# Patient Record
Sex: Male | Born: 1995 | Race: White | Hispanic: No | Marital: Single | Smoking: Light tobacco smoker
Health system: Southern US, Community
[De-identification: ages and names within clinical notes are randomized; demographics above are authoritative.]

## PROBLEM LIST (undated history)

## (undated) ENCOUNTER — Ambulatory Visit (HOSPITAL_COMMUNITY): Admission: EM | Payer: 59 | Source: Home / Self Care

## (undated) DIAGNOSIS — G40909 Epilepsy, unspecified, not intractable, without status epilepticus: Secondary | ICD-10-CM

## (undated) DIAGNOSIS — F909 Attention-deficit hyperactivity disorder, unspecified type: Secondary | ICD-10-CM

## (undated) DIAGNOSIS — J45909 Unspecified asthma, uncomplicated: Secondary | ICD-10-CM

## (undated) DIAGNOSIS — R569 Unspecified convulsions: Secondary | ICD-10-CM

## (undated) DIAGNOSIS — F419 Anxiety disorder, unspecified: Secondary | ICD-10-CM

## (undated) HISTORY — PX: ADENOIDECTOMY: SUR15

## (undated) HISTORY — DX: Attention-deficit hyperactivity disorder, unspecified type: F90.9

## (undated) HISTORY — DX: Unspecified asthma, uncomplicated: J45.909

## (undated) HISTORY — DX: Anxiety disorder, unspecified: F41.9

---
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
## 2016-06-16 ENCOUNTER — Ambulatory Visit (INDEPENDENT_AMBULATORY_CARE_PROVIDER_SITE_OTHER): Payer: Commercial Managed Care - PPO | Admitting: Medical

## 2016-06-16 ENCOUNTER — Encounter: Payer: Self-pay | Admitting: Medical

## 2016-06-16 VITALS — BP 122/84 | HR 66

## 2016-06-16 DIAGNOSIS — F411 Generalized anxiety disorder: Secondary | ICD-10-CM | POA: Insufficient documentation

## 2016-06-16 DIAGNOSIS — F4010 Social phobia, unspecified: Secondary | ICD-10-CM | POA: Insufficient documentation
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
## 2016-06-16 NOTE — Patient Instructions (Addendum)
Counseling services  [facility] [address], [place], [place] [address] [phone]  [facility] [address], [place], [place] [address] Phone: [phone]   [facility] [phone] office [email or web address] [address] [place], [place], [place] [address]  Gale JourneyLaura Atkinson, therapist  Franchot ErichsenErik Nelson, MA, clinical psychologist  Cognitive-Behavior Therapy; Mood Disorders; Anxiety Disorders; adult and child ADHD; Family Therapy; Stress Management; personal growth, and Marital Therapy.    Carlus Pavlovennis McKnight Ph.D., clinical psychologist Cognitive-Behavior Therapy; Mood Disorders; Anxiety Disorders; Stress     Management   [facility] [address], [place], [place] [address] [phone]

## 2016-07-03 ENCOUNTER — Ambulatory Visit (INDEPENDENT_AMBULATORY_CARE_PROVIDER_SITE_OTHER): Payer: Commercial Managed Care - PPO | Admitting: Psychology

## 2016-07-03 DIAGNOSIS — F4010 Social phobia, unspecified: Secondary | ICD-10-CM

## 2016-07-03 DIAGNOSIS — F1220 Cannabis dependence, uncomplicated: Secondary | ICD-10-CM | POA: Diagnosis not present

## 2016-07-21 ENCOUNTER — Ambulatory Visit (INDEPENDENT_AMBULATORY_CARE_PROVIDER_SITE_OTHER): Payer: Commercial Managed Care - PPO | Admitting: Psychology

## 2016-07-21 DIAGNOSIS — F4010 Social phobia, unspecified: Secondary | ICD-10-CM

## 2016-08-16 ENCOUNTER — Ambulatory Visit: Payer: Self-pay | Admitting: Psychology

## 2016-12-11 ENCOUNTER — Ambulatory Visit (INDEPENDENT_AMBULATORY_CARE_PROVIDER_SITE_OTHER): Payer: Commercial Managed Care - PPO | Admitting: Psychology

## 2016-12-11 DIAGNOSIS — F4010 Social phobia, unspecified: Secondary | ICD-10-CM

## 2017-03-24 ENCOUNTER — Emergency Department (HOSPITAL_COMMUNITY)
Admission: EM | Admit: 2017-03-24 | Discharge: 2017-03-25 | Disposition: A | Discharge status: Other Acute Inpatient Hospital | Payer: Commercial Managed Care - PPO | Attending: Emergency Medicine | Admitting: Emergency Medicine

## 2017-03-24 ENCOUNTER — Encounter (HOSPITAL_COMMUNITY): Payer: Self-pay

## 2017-03-24 ENCOUNTER — Ambulatory Visit (HOSPITAL_COMMUNITY)
Admission: RE | Admit: 2017-03-24 | Discharge: 2017-03-24 | Disposition: A | Discharge status: Home / Self Care | Payer: Commercial Managed Care - PPO | Attending: Psychiatry | Admitting: Psychiatry

## 2017-03-24 DIAGNOSIS — T1491XA Suicide attempt, initial encounter: Secondary | ICD-10-CM | POA: Insufficient documentation

## 2017-03-24 DIAGNOSIS — Y929 Unspecified place or not applicable: Secondary | ICD-10-CM | POA: Diagnosis not present

## 2017-03-24 DIAGNOSIS — X58XXXA Exposure to other specified factors, initial encounter: Secondary | ICD-10-CM | POA: Insufficient documentation

## 2017-03-24 DIAGNOSIS — Z5181 Encounter for therapeutic drug level monitoring: Secondary | ICD-10-CM | POA: Diagnosis not present

## 2017-03-24 DIAGNOSIS — R45851 Suicidal ideations: Secondary | ICD-10-CM

## 2017-03-24 DIAGNOSIS — F1320 Sedative, hypnotic or anxiolytic dependence, uncomplicated: Secondary | ICD-10-CM | POA: Insufficient documentation

## 2017-03-24 DIAGNOSIS — Y939 Activity, unspecified: Secondary | ICD-10-CM | POA: Diagnosis not present

## 2017-03-24 DIAGNOSIS — Y999 Unspecified external cause status: Secondary | ICD-10-CM | POA: Diagnosis not present

## 2017-03-24 DIAGNOSIS — F17200 Nicotine dependence, unspecified, uncomplicated: Secondary | ICD-10-CM | POA: Insufficient documentation

## 2017-03-24 LAB — RAPID URINE DRUG SCREEN, HOSP PERFORMED
AMPHETAMINES: NOT DETECTED
BENZODIAZEPINES: POSITIVE — AB
Barbiturates: NOT DETECTED
Cocaine: NOT DETECTED
Opiates: NOT DETECTED
TETRAHYDROCANNABINOL: NOT DETECTED

## 2017-03-24 LAB — COMPREHENSIVE METABOLIC PANEL
ALBUMIN: 5 g/dL (ref 3.5–5.0)
ALT: 16 U/L — AB (ref 17–63)
AST: 28 U/L (ref 15–41)
Alkaline Phosphatase: 137 U/L — ABNORMAL HIGH (ref 38–126)
Anion gap: 13 (ref 5–15)
BUN: 8 mg/dL (ref 6–20)
CO2: 24 mmol/L (ref 22–32)
CREATININE: 1.06 mg/dL (ref 0.61–1.24)
Calcium: 9.8 mg/dL (ref 8.9–10.3)
Chloride: 107 mmol/L (ref 101–111)
GFR calc Af Amer: >60 mL/min (ref >60)
GFR calc non Af Amer: >60 mL/min (ref >60)
Glucose, Bld: 88 mg/dL (ref 65–99)
POTASSIUM: 3.5 mmol/L (ref 3.5–5.1)
Sodium: 144 mmol/L (ref 135–145)
Total Bilirubin: 0.4 mg/dL (ref 0.3–1.2)
Total Protein: 8.3 g/dL — ABNORMAL HIGH (ref 6.5–8.1)

## 2017-03-24 LAB — CBC
HEMATOCRIT: 50.4 % (ref 39.0–52.0)
HEMOGLOBIN: 18.3 g/dL — AB (ref 13.0–17.0)
MCH: 32.2 pg (ref 26.0–34.0)
MCHC: 36.3 g/dL — ABNORMAL HIGH (ref 30.0–36.0)
MCV: 88.6 fL (ref 78.0–100.0)
PLATELETS: 270 10*3/uL (ref 150–400)
RBC: 5.69 MIL/uL (ref 4.22–5.81)
RDW: 13.3 % (ref 11.5–15.5)
WBC: 8.8 10*3/uL (ref 4.0–10.5)

## 2017-03-24 LAB — SALICYLATE LEVEL: Salicylate Lvl: <7 mg/dL (ref 2.8–30.0)

## 2017-03-24 LAB — ETHANOL
ALCOHOL ETHYL (B): 267 mg/dL — AB (ref <5)
Alcohol, Ethyl (B): 160 mg/dL — ABNORMAL HIGH (ref <5)

## 2017-03-24 LAB — ACETAMINOPHEN LEVEL: Acetaminophen (Tylenol), Serum: <10 ug/mL — ABNORMAL LOW (ref 10–30)

## 2017-03-24 MED ORDER — THIAMINE HCL 100 MG/ML IJ SOLN: 100.0000 mg | Freq: Every day | INTRAMUSCULAR | Status: DC

## 2017-03-24 MED ORDER — LORAZEPAM 2 MG/ML IJ SOLN: 0.0000 mg | Freq: Four times a day (QID) | INTRAMUSCULAR | Status: DC

## 2017-03-24 MED ORDER — LORAZEPAM 2 MG/ML IJ SOLN: 0.0000 mg | Freq: Two times a day (BID) | INTRAMUSCULAR | Status: DC

## 2017-03-24 MED ORDER — LORAZEPAM 1 MG PO TABS
1.0000 mg | ORAL_TABLET | Freq: Once | ORAL | Status: AC
  Administered 2017-03-24: 1 mg via ORAL
  Filled 2017-03-24: qty 1

## 2017-03-24 MED ORDER — HALOPERIDOL LACTATE 5 MG/ML IJ SOLN
5.0000 mg | Freq: Once | INTRAMUSCULAR | Status: AC
  Administered 2017-03-24: 5 mg via INTRAMUSCULAR
  Filled 2017-03-24: qty 1

## 2017-03-24 MED ORDER — LORAZEPAM 1 MG PO TABS
0.0000 mg | ORAL_TABLET | Freq: Four times a day (QID) | ORAL | Status: DC
  Administered 2017-03-24: 1 mg via ORAL
  Filled 2017-03-24: qty 1

## 2017-03-24 MED ORDER — LORAZEPAM 1 MG PO TABS: 0.0000 mg | ORAL_TABLET | Freq: Two times a day (BID) | ORAL | Status: DC

## 2017-03-24 MED ORDER — VITAMIN B-1 100 MG PO TABS: 100.0000 mg | ORAL_TABLET | Freq: Every day | ORAL | Status: DC

## 2017-03-24 NOTE — ED Notes (Signed)
Patient not seen in lobby, per registration, patient not in lobby. Registration and this Clinical research associatewriter saw patient and family member walking aware from the emergency department to the parking lot.

## 2017-03-24 NOTE — ED Notes (Signed)
Report to Gary RN in SAPPU-ready for patient 

## 2017-03-24 NOTE — BH Assessment (Signed)
[facility] with mother. Pt reported on walk in form that he had overdosed two days ago on #9 pills, possibly lexapro or buspar in suicide attempted. Reviewed this with NP Jacki ConesLaurie, out to lobby to speak with patient and his mother , who accompanied him. Mother reported patient told her this and patient agreed, however patient with racing thoughts, pressured speech, and poor historian stating '' I just did that but it's fine I don't need to stay I'm not gonna do it anymore'' Pt mother had to help patient fill out form as patient was pacing and drinking water cups rapidly. Due to concern of poor history, and patient reported overdose [facility] and [facility]. Pt and mother agreed. Pt mother requested to take patient to lobby instead of [facility] or EMS which was offered by this Clinical research associatewriter and Jacki ConesLaurie NP. Above Report given to [facility].
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
## 2017-03-24 NOTE — ED Notes (Signed)
Pt. Transferred to [facility] from ED to 37 room after screening for contraband. Report to include Situation, Background, Assessment and Recommendations from RN. Pt. Oriented to unit including Q15 minute rounds as well as the security cameras for their protection. Patient is alert and oriented, warm and dry in no acute distress. Patient denies SI, HI, and AVH. Pt. Encouraged to let me know if needs arise.

## 2017-03-24 NOTE — ED Notes (Signed)
Patient is uncooperative/cursing and threatening staff-patient punching walls in the room-Dr. Freida BusmanAllen notified and orders obtained for physical and chemical restraints.

## 2017-03-24 NOTE — ED Notes (Signed)
Patient is resting quietly now-restraints removed

## 2017-03-24 NOTE — ED Triage Notes (Signed)
[facility].  Pt has had suicidal thoughts and states took antidepressants lexapro and buspar.  Pt family finally able to get him to come in here today.  Pt states he is aggressive and will "beat anyone that messes with him".  Pt then states he doesn't want to hurt anyone.  Pt is emotional at times during assessment.  Pt wants 1/2 xanax which he normally takes but has not taken in past 2 days.  States he will find is somewhere else if not given to him so "it's ok".  Mom/boyfriend and step dad is with patient.  Pt denies hallucinations.

## 2017-03-24 NOTE — ED Notes (Signed)
Bed: [facility] Expected date:  Expected time:  Means of arrival:  Comments: 
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
## 2017-03-24 NOTE — Progress Notes (Signed)
Pt meets criteria for inpt treatment. TTS faxed referrals to the following inpt facilities for review:  [facility], [facility], [facility], [facility], [address], [facility], [facility], [facility]   TTS will continue to seek bed placement.  [name], LCSWA
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
## 2017-03-25 NOTE — ED Notes (Signed)
Hourly rounding reveals patient sleeping in room. No complaints, stable, in no acute distress. Q15 minute rounds and monitoring via Security Cameras to continue. 

## 2017-03-25 NOTE — ED Notes (Signed)
Pt sleeping at present, no distress noted, calm & cooperative at present.  Monitoring for safety, Q 15 min checks in effect.  Pending report to  [facility] at 8:30 am.

## 2017-03-25 NOTE — ED Provider Notes (Signed)
  Physical Exam  BP 133/63 (BP Location: Right Arm)   Pulse (!) 52   Temp 98.8 F (37.1 C) (Oral)   Resp 16   SpO2 98%   Physical Exam  ED Course  Procedures MDM [facility] under Dr. Mordecai MaesSanchez. Stable for transfer       Charlynne PanderYao, Traevon Meiring Hsienta, MD 03/25/17 [phone]

## 2017-03-25 NOTE — ED Notes (Signed)
Pt. Made aware of [facility]. Pt. Acknowledged same.

## 2017-03-25 NOTE — ED Notes (Signed)
Report called to [facility], RN Ferdinand CavaJenaro Figueroa accepted report.  Pharmacologistending [facility] [employer].

## 2017-03-25 NOTE — BH Assessment (Signed)
Received call from [facility] stating Pt has been accepted by Dr. Cyril MourningIlla Sanchez. Pt can arrive after 0800 today. Telephone number for nursing report is [phone]. Notified Marita SnellenGary Leduc, RN and Dr. Rhunette CroftNanavati of acceptance.   Harlin RainFord Ellis Patsy BaltimoreWarrick Jr, LPC, [facility], [facility] Triage Specialist [phone]

## 2017-03-25 NOTE — ED Notes (Signed)
[facility] at bedside to transport pt to [facility].

## 2017-04-12 ENCOUNTER — Ambulatory Visit (INDEPENDENT_AMBULATORY_CARE_PROVIDER_SITE_OTHER): Payer: Commercial Managed Care - PPO | Admitting: Family Medicine

## 2017-04-12 ENCOUNTER — Encounter: Payer: Self-pay | Admitting: Family Medicine

## 2017-04-12 VITALS — BP 133/81 | HR 64 | Temp 98.0°F | Resp 16 | Ht 70.0 in | Wt 148.6 lb

## 2017-04-12 DIAGNOSIS — F17200 Nicotine dependence, unspecified, uncomplicated: Secondary | ICD-10-CM | POA: Diagnosis not present

## 2017-04-12 DIAGNOSIS — F1310 Sedative, hypnotic or anxiolytic abuse, uncomplicated: Secondary | ICD-10-CM | POA: Diagnosis not present

## 2017-04-12 DIAGNOSIS — G4700 Insomnia, unspecified: Secondary | ICD-10-CM | POA: Diagnosis not present

## 2017-04-12 DIAGNOSIS — F1010 Alcohol abuse, uncomplicated: Secondary | ICD-10-CM | POA: Diagnosis not present

## 2017-04-12 DIAGNOSIS — F411 Generalized anxiety disorder: Secondary | ICD-10-CM

## 2017-04-12 DIAGNOSIS — F4010 Social phobia, unspecified: Secondary | ICD-10-CM | POA: Diagnosis not present

## 2017-04-12 DIAGNOSIS — Z5181 Encounter for therapeutic drug level monitoring: Secondary | ICD-10-CM

## 2017-04-12 DIAGNOSIS — F321 Major depressive disorder, single episode, moderate: Secondary | ICD-10-CM

## 2017-04-12 MED ORDER — CHLORDIAZEPOXIDE HCL 5 MG PO CAPS: 5.0000 mg | ORAL_CAPSULE | ORAL | 0 refills | Status: DC

## 2017-04-12 MED ORDER — DOXEPIN HCL 10 MG PO CAPS: 10.0000 mg | ORAL_CAPSULE | Freq: Every evening | ORAL | 1 refills | Status: DC | PRN

## 2017-04-12 MED ORDER — VENLAFAXINE HCL ER 37.5 MG PO CP24: 37.5000 mg | ORAL_CAPSULE | Freq: Every day | ORAL | 0 refills | Status: DC
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
## 2017-04-12 NOTE — Patient Instructions (Addendum)
     IF you received an x-ray today, you will receive an invoice from [facility]. Please contact [facility] at [phone] with questions or concerns regarding your invoice.   IF you received labwork today, you will receive an invoice from [facility]. Please contact [facility] at [phone] with questions or concerns regarding your invoice.   Our billing staff will not be able to assist you with questions regarding bills from these companies.  You will be contacted with the lab results as soon as they are available. The fastest way to get your results is to activate your My Chart account. Instructions are located on the last page of this paperwork. If you have not heard from us regarding the results in 2 weeks, please contact this office.     Check out [facility]    No psychiatric services take physician referrals - they always want the patient to call. Some excellent private psychiatrists for individual counseling are:   [facility] [address], [place], [place] [address] Phone: [phone]  [facility]  [address], [place], [place] [address]  [phone]  [facility]  [address], [place], [place] [address]  [phone]  [facility] [address] [place], [place] Phone: [phone]    [facility]  [address], [place], [place] [address]  [phone]

## 2017-04-14 ENCOUNTER — Encounter: Payer: Self-pay | Admitting: Family Medicine

## 2017-04-14 DIAGNOSIS — F1310 Sedative, hypnotic or anxiolytic abuse, uncomplicated: Secondary | ICD-10-CM

## 2017-04-14 DIAGNOSIS — G4700 Insomnia, unspecified: Secondary | ICD-10-CM | POA: Insufficient documentation

## 2017-04-14 DIAGNOSIS — F1010 Alcohol abuse, uncomplicated: Secondary | ICD-10-CM | POA: Insufficient documentation

## 2017-04-14 DIAGNOSIS — F17200 Nicotine dependence, unspecified, uncomplicated: Secondary | ICD-10-CM | POA: Insufficient documentation

## 2017-04-14 HISTORY — DX: Sedative, hypnotic or anxiolytic abuse, uncomplicated: F13.10

## 2017-04-20 LAB — TOXASSURE SELECT 13 (MW), URINE

## 2017-04-25 ENCOUNTER — Telehealth: Payer: Self-pay | Admitting: Family Medicine

## 2017-04-25 NOTE — Telephone Encounter (Signed)
DR SHAW PT CALLING TO INFORM YOU THAT NONE OF THE THREE MEDICINES THAT YOU PERSCRIBED HIM IS WORKING IT ONLY MADE HIM THE SAME WAY AND MORE ANXIOUS  PT WOULD LIKE FOR YOU TO WRITE HIM A RX FOR LORAZEPAM AND WHEN HE TRIED TO MAKE APPOINTMENTS TO SEE A THERAPIST THEY ONLY HAD APPTS IN OCT AND PT WOULD LIKE SOMETHING SOONER

## 2017-04-25 NOTE — Telephone Encounter (Signed)
See note below. Please advise.  

## 2017-04-25 NOTE — Telephone Encounter (Signed)
Dr Shaw

## 2017-04-26 ENCOUNTER — Encounter: Payer: Self-pay | Admitting: Family Medicine

## 2017-04-26 ENCOUNTER — Ambulatory Visit (INDEPENDENT_AMBULATORY_CARE_PROVIDER_SITE_OTHER): Payer: Commercial Managed Care - PPO | Admitting: Family Medicine

## 2017-04-26 VITALS — BP 149/74 | HR 71 | Temp 98.4°F | Resp 18 | Ht 70.0 in | Wt 149.8 lb

## 2017-04-26 DIAGNOSIS — F4010 Social phobia, unspecified: Secondary | ICD-10-CM

## 2017-04-26 DIAGNOSIS — F1310 Sedative, hypnotic or anxiolytic abuse, uncomplicated: Secondary | ICD-10-CM

## 2017-04-26 DIAGNOSIS — G4700 Insomnia, unspecified: Secondary | ICD-10-CM | POA: Diagnosis not present

## 2017-04-26 DIAGNOSIS — S93402A Sprain of unspecified ligament of left ankle, initial encounter: Secondary | ICD-10-CM | POA: Diagnosis not present

## 2017-04-26 DIAGNOSIS — F17200 Nicotine dependence, unspecified, uncomplicated: Secondary | ICD-10-CM | POA: Diagnosis not present

## 2017-04-26 DIAGNOSIS — F411 Generalized anxiety disorder: Secondary | ICD-10-CM | POA: Diagnosis not present

## 2017-04-26 MED ORDER — DOXEPIN HCL 50 MG PO CAPS: 50.0000 mg | ORAL_CAPSULE | Freq: Every evening | ORAL | 0 refills | Status: DC | PRN

## 2017-04-26 MED ORDER — DESVENLAFAXINE SUCCINATE ER 50 MG PO TB24: 50.0000 mg | ORAL_TABLET | Freq: Every day | ORAL | 1 refills | Status: DC

## 2017-04-26 MED ORDER — LORAZEPAM 1 MG PO TABS: 1.0000 mg | ORAL_TABLET | Freq: Two times a day (BID) | ORAL | 0 refills | Status: DC | PRN

## 2017-04-26 NOTE — Addendum Note (Signed)
Addended by: Sherren MochaSHAW, Iban Utz N on: 04/26/2017 08:00 AM   Modules accepted: Orders

## 2017-04-26 NOTE — Patient Instructions (Addendum)
IF you received an x-ray today, you will receive an invoice from [facility]. Please contact [facility] at [phone] with questions or concerns regarding your invoice.   IF you received labwork today, you will receive an invoice from [facility]. Please contact [facility] at [phone] with questions or concerns regarding your invoice.   Our billing staff will not be able to assist you with questions regarding bills from these companies.  You will be contacted with the lab results as soon as they are available. The fastest way to get your results is to activate your My Chart account. Instructions are located on the last page of this paperwork. If you have not heard from us regarding the results in 2 weeks, please contact this office.      Ankle Sprain, Phase II Rehab Ask your health care provider which exercises are safe for you. Do exercises exactly as told by your health care provider and adjust them as directed. It is normal to feel mild stretching, pulling, tightness, or discomfort as you do these exercises, but you should stop right away if you feel sudden pain or your pain gets worse.Do not begin these exercises until told by your health care provider. Stretching and range of motion exercises These exercises warm up your muscles and joints and improve the movement and flexibility of your lower leg and ankle. These exercises also help to relieve pain and stiffness. Exercise A: Gastroc stretch, standing  1. Stand with your hands against a wall. 2. Extend your left / right leg behind you, and bend your front knee slightly. Your heels should be on the floor. 3. Keeping your heels on the floor and your back knee straight, shift your weight toward the wall. You should feel a gentle stretch in the back of your lower leg (calf). 4. Hold this position for __________ seconds. Repeat __________ times. Complete this exercise __________ times a day. Exercise B: Soleus  stretch, standing 1. Stand with your hands against a wall. 2. Extend your left / right leg behind you, and bend your front knee slightly. Both of your heels should be on the floor. 3. Keeping your heels on the floor, bend your back knee and shift your weight slightly over your back leg. You should feel a gentle stretch deep in your calf. 4. Hold this position for __________ seconds. Repeat __________ times. Complete this exercise __________ times a day. Strengthening exercises These exercises build strength and endurance in your lower leg. Endurance is the ability to use your muscles for a long time, even after they get tired. Exercise C: Heel walking ( dorsiflexion) Walk on your heels for __________ seconds or ___________ ft. Keep your toes as high as possible. Repeat __________ times. Complete this exercise __________ times a day. Balance exercises These exercises improve your balance and the reaction and control of your ankle to help improve stability. Exercise D: Multi-angle lunge 1. Stand with your feet together. 2. Take a step forward with your left / right leg, and shift your weight onto that leg. Your back heel will come off the floor, and your back toes will stay in place. 3. Push off your front leg to return your front foot to the starting position next to your other foot. 4. Repeat to the side, to the back, and any other directions as told by your health care provider. Repeat in each direction __________ times. Complete this exercise __________ times a day. Exercise E: Single leg stand 1. Without shoes,  stand near a railing or in a door frame. Hold onto the railing or door frame as needed. 2. Stand on your left / right foot. Keep your big toe down on the floor and try to keep your arch lifted. 3. Hold this position for __________ seconds. Repeat __________ times. Complete this exercise __________ times a day. If this exercise is too easy, you can try it with your eyes closed or  while standing on a pillow. Exercise F: Inversion/eversion  You will need a balance board for this exercise. Ask your health care provider where you can get a balance board or how you can make one. 1. Stand on a non-carpeted surface near a countertop or wall. 2. Step onto the balance board so your feet are hip-width apart. 3. Keep your feet in place and keep your upper body and hips steady. Using only your feet and ankles to move the board, do one or both of the following exercises as told by your health care provider: ? Tip the board side to side as far as you can, alternating between tipping to the left and tipping to the right. If you can, tip the board so it silently taps the floor. Do not let the board forcefully hit the floor. From time to time, pause to hold a steady position. ? Tip the board side to side so the board does not hit the floor at all. From time to time, pause to hold a steady position. Repeat the movement for each exercise __________ times. Complete each exercise __________ times a day. Exercise G: Plantar flexion/dorsiflexion  You will need a balance board for this exercise. Ask your health care provider where you can get a balance board or how you can make one. 1. Stand on a non-carpeted surface near a countertop or wall. 2. Step onto the balance board so your feet are hip-width apart. 3. Keep your feet in place and keep your upper body and hips steady. Using only your feet and ankles to move the board, do one or both of the following exercises as told by your health care provider: ? Tip the board forward and backward so the board silently taps the floor. Do not let the board forcefully hit the floor. From time to time, pause to hold a steady position. ? Tip the board forward and backward so the board does not hit the floor at all. From time to time, pause to hold a steady position. Repeat the movement for each exercise __________ times. Complete each exercise __________  times a day. This information is not intended to replace advice given to you by your health care provider. Make sure you discuss any questions you have with your health care provider. Document Released: 01/08/2006 Document Revised: 05/25/2016 Document Reviewed: 08/02/2015 Elsevier Interactive Patient Education  2018 [employer]

## 2017-04-26 NOTE — Progress Notes (Signed)
Subjective:    Patient ID: Justin May, male    DOB: 1996-06-10, 21 y.o.   MRN: [id number] Chief Complaint  Patient presents with  . Medication Assistance    consult    HPI Has had weird lucid dreams - had them when he was at [facility] which were more violent than these  Had been on 25mg  bid and still nothing.    [facility] and [facility].  Has had to call out of work for the past several days - feels like nothing is going to work for him because this has been going on for so long - can't even go onto the porch.  Did go in last 4-5d ago.  Felt like he got more anxious he didn't work.    Uncle with crippling anxieyt - and zoloft helped him a lot.    He did throw the lexapro away after 10d.  But then after the he realized the librium didn't work is when things spiked with this.   Past Medical History:  Diagnosis Date  . Anxiety   . Asthma    History reviewed. No pertinent surgical history. No current outpatient prescriptions on file prior to visit.   No current facility-administered medications on file prior to visit.    No Known Allergies History reviewed. No pertinent family history. Social History   Social History  . Marital status: Single    Spouse name: N/A  . Number of children: N/A  . Years of education: N/A   Social History Main Topics  . Smoking status: Current Every Day Smoker  . Smokeless tobacco: Never Used  . Alcohol use Yes  . Drug use: No  . Sexual activity: Not Currently   Other Topics Concern  . None   Social History Narrative  . None   Depression screen [facility] 2/9 04/26/2017 04/12/2017  Decreased Interest 0 3  Down, Depressed, Hopeless 0 1  PHQ - 2 Score 0 4  Altered sleeping - 3  Tired, decreased energy - 1  Change in appetite - 3  Feeling bad or failure about yourself  - 1  Trouble concentrating - 0  Moving slowly or fidgety/restless - 3  Suicidal thoughts - 0  PHQ-9 Score - 15  Difficult doing work/chores  - Extremely dIfficult     Review of Systems See hpi    Objective:   Physical Exam  Constitutional: He is oriented to person, place, and time. He appears well-developed and well-nourished. No distress.  HENT:  Head: Normocephalic and atraumatic.  Eyes: No scleral icterus.  Pulmonary/Chest: Effort normal.  Neurological: He is alert and oriented to person, place, and time.  Skin: Skin is warm and dry. He is not diaphoretic.  Psychiatric: His behavior is normal. His mood appears anxious.      BP (!) 149/74   Pulse 71   Temp 98.4 F (36.9 C) (Oral)   Resp 18   Ht 5\' 10"  (1.778 m)   Wt 149 lb 12.8 oz (67.9 kg)   SpO2 99%   BMI 21.49 kg/m     Assessment & Plan:   1. Moderate left ankle sprain, initial encounter   2. Anxiety state   3. Social phobia   4. Insomnia, unspecified type   5. Mild benzodiazepine use disorder   6. Tobacco use disorder      Meds ordered this encounter  Medications  . doxepin (SINEQUAN) 50 MG capsule    Sig: Take 1 capsule (  50 mg total) by mouth at bedtime as needed (insomnia).    Dispense:  30 capsule    Refill:  0  . desvenlafaxine (PRISTIQ) 50 MG 24 hr tablet    Sig: Take 1 tablet (50 mg total) by mouth daily.    Dispense:  30 tablet    Refill:  1  . LORazepam (ATIVAN) 1 MG tablet    Sig: Take 1 tablet (1 mg total) by mouth 2 (two) times daily as needed for anxiety.    Dispense:  60 tablet    Refill:  0     [name], M.D.  Primary Care at [facility] [address] [place], [place] [address] [phone] phone [phone] fax  04/28/17 11:48 PM

## 2017-04-26 NOTE — Telephone Encounter (Signed)
Did he really call all 5 places I gave him?  I had another pt who just got an appointment with a provider at [facility] in August. . .  If he really has tried all 5 places, I would recommend the [facility] [phone]) or [facility] (i will place referral for this but they are often several mos out.   [facility]  [address], [place], [place] [address]  [phone]  [facility] [address], [place], [place] [address]  [phone]  [facility] [address], [address], [place], [id number] Phone: [phone]   If he wants to change a controlled medication, that requires an office visit and he needs to bring him current meds with him to exchange

## 2017-04-30 ENCOUNTER — Emergency Department (HOSPITAL_COMMUNITY)
Admission: EM | Admit: 2017-04-30 | Discharge: 2017-05-01 | Disposition: A | Discharge status: Home / Self Care | Payer: Commercial Managed Care - PPO | Attending: Emergency Medicine | Admitting: Emergency Medicine

## 2017-04-30 ENCOUNTER — Encounter (HOSPITAL_COMMUNITY): Payer: Self-pay | Admitting: *Deleted

## 2017-04-30 DIAGNOSIS — F919 Conduct disorder, unspecified: Secondary | ICD-10-CM | POA: Diagnosis present

## 2017-04-30 DIAGNOSIS — J45909 Unspecified asthma, uncomplicated: Secondary | ICD-10-CM | POA: Diagnosis not present

## 2017-04-30 DIAGNOSIS — R45851 Suicidal ideations: Secondary | ICD-10-CM | POA: Diagnosis not present

## 2017-04-30 DIAGNOSIS — F4325 Adjustment disorder with mixed disturbance of emotions and conduct: Secondary | ICD-10-CM | POA: Diagnosis present

## 2017-04-30 DIAGNOSIS — F17200 Nicotine dependence, unspecified, uncomplicated: Secondary | ICD-10-CM | POA: Diagnosis not present

## 2017-04-30 DIAGNOSIS — G4700 Insomnia, unspecified: Secondary | ICD-10-CM | POA: Diagnosis not present

## 2017-04-30 DIAGNOSIS — Z638 Other specified problems related to primary support group: Secondary | ICD-10-CM

## 2017-04-30 DIAGNOSIS — F1310 Sedative, hypnotic or anxiolytic abuse, uncomplicated: Secondary | ICD-10-CM | POA: Diagnosis not present

## 2017-04-30 DIAGNOSIS — F1010 Alcohol abuse, uncomplicated: Secondary | ICD-10-CM | POA: Diagnosis not present

## 2017-04-30 DIAGNOSIS — Z9114 Patient's other noncompliance with medication regimen: Secondary | ICD-10-CM | POA: Insufficient documentation

## 2017-04-30 LAB — RAPID URINE DRUG SCREEN, HOSP PERFORMED
Amphetamines: NOT DETECTED
BARBITURATES: NOT DETECTED
Benzodiazepines: POSITIVE — AB
COCAINE: NOT DETECTED
OPIATES: NOT DETECTED
TETRAHYDROCANNABINOL: POSITIVE — AB

## 2017-04-30 LAB — COMPREHENSIVE METABOLIC PANEL
ALT: 20 U/L (ref 17–63)
ANION GAP: 8 (ref 5–15)
AST: 32 U/L (ref 15–41)
Albumin: 4.3 g/dL (ref 3.5–5.0)
Alkaline Phosphatase: 75 U/L (ref 38–126)
BILIRUBIN TOTAL: 0.8 mg/dL (ref 0.3–1.2)
BUN: 15 mg/dL (ref 6–20)
CALCIUM: 8.9 mg/dL (ref 8.9–10.3)
CO2: 25 mmol/L (ref 22–32)
CREATININE: 0.86 mg/dL (ref 0.61–1.24)
Chloride: 103 mmol/L (ref 101–111)
Glucose, Bld: 111 mg/dL — ABNORMAL HIGH (ref 65–99)
Potassium: 3.7 mmol/L (ref 3.5–5.1)
Sodium: 136 mmol/L (ref 135–145)
TOTAL PROTEIN: 6.9 g/dL (ref 6.5–8.1)

## 2017-04-30 LAB — CBC
HCT: 42.9 % (ref 39.0–52.0)
HEMOGLOBIN: 15.2 g/dL (ref 13.0–17.0)
MCH: 30.5 pg (ref 26.0–34.0)
MCHC: 35.4 g/dL (ref 30.0–36.0)
MCV: 86 fL (ref 78.0–100.0)
Platelets: 180 10*3/uL (ref 150–400)
RBC: 4.99 MIL/uL (ref 4.22–5.81)
RDW: 13.2 % (ref 11.5–15.5)
WBC: 10 10*3/uL (ref 4.0–10.5)

## 2017-04-30 LAB — ACETAMINOPHEN LEVEL

## 2017-04-30 LAB — SALICYLATE LEVEL

## 2017-04-30 LAB — ETHANOL: Alcohol, Ethyl (B): <5 mg/dL (ref <5)

## 2017-04-30 MED ORDER — DOXEPIN HCL 50 MG PO CAPS
50.0000 mg | ORAL_CAPSULE | Freq: Every evening | ORAL | Status: DC | PRN
  Filled 2017-04-30: qty 1

## 2017-04-30 MED ORDER — LORAZEPAM 1 MG PO TABS: 1.0000 mg | ORAL_TABLET | Freq: Two times a day (BID) | ORAL | Status: DC | PRN

## 2017-04-30 MED ORDER — VENLAFAXINE HCL ER 75 MG PO CP24
75.0000 mg | ORAL_CAPSULE | Freq: Every day | ORAL | Status: DC
  Administered 2017-05-01: 75 mg via ORAL
  Filled 2017-04-30: qty 1

## 2017-04-30 MED ORDER — ACETAMINOPHEN 325 MG PO TABS: 650.0000 mg | ORAL_TABLET | ORAL | Status: DC | PRN
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
## 2017-04-30 NOTE — Telephone Encounter (Signed)
Left message to return call 

## 2017-04-30 NOTE — ED Notes (Signed)
Bed: WBH40 Expected date:  Expected time:  Means of arrival:  Comments: Hold for 27 

## 2017-04-30 NOTE — ED Notes (Signed)
Introduced self to patient.  Pt oriented to unit expectations.  Assessed pt for:  A) Anxiety &/or agitation: On admission to the [facility] pt is calm and cooperative. Denies SI/HI, A/VH. His affect is appropriate. There is a clean and dry, intact dressing on his hand.   S) Safety: Safety maintained with q-15-minute checks and hourly rounds by staff, and security assessed pt for contraband prior to admission.   A) ADLs: Pt able to perform ADLs independently.  P) Pick-Up (room cleanliness): Pt's room clean and free of clutter.
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
## 2017-04-30 NOTE — ED Triage Notes (Signed)
Patient complains of anxiety and depression. Mother IVC'd patient because she does not think he is taking his medications. Patient has increasing combativeness and has threatened suicide today and in the last week.

## 2017-04-30 NOTE — BH Assessment (Signed)
[facility] Assessment Progress Note   Case was staffed with Lord DNP who recommended patient be re-evaluated in the a.m.    
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
## 2017-05-01 DIAGNOSIS — F4325 Adjustment disorder with mixed disturbance of emotions and conduct: Secondary | ICD-10-CM | POA: Diagnosis not present

## 2017-05-01 DIAGNOSIS — G4700 Insomnia, unspecified: Secondary | ICD-10-CM | POA: Diagnosis not present

## 2017-05-01 DIAGNOSIS — F1310 Sedative, hypnotic or anxiolytic abuse, uncomplicated: Secondary | ICD-10-CM

## 2017-05-01 DIAGNOSIS — F17210 Nicotine dependence, cigarettes, uncomplicated: Secondary | ICD-10-CM

## 2017-05-01 DIAGNOSIS — F1010 Alcohol abuse, uncomplicated: Secondary | ICD-10-CM | POA: Diagnosis not present

## 2017-05-01 DIAGNOSIS — Z9114 Patient's other noncompliance with medication regimen: Secondary | ICD-10-CM | POA: Diagnosis not present

## 2017-05-01 DIAGNOSIS — J45909 Unspecified asthma, uncomplicated: Secondary | ICD-10-CM | POA: Diagnosis not present
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
## 2017-05-01 NOTE — ED Notes (Signed)
Assessed pt for:  A) Anxiety &/or agitation: Pt is calm and cooperative. He woke up and ate breakfast. No complaints voiced. Pt would like to go home. Denies SI/HI/AVH.  S) Safety: Safety maintained with q-15-minute checks and hourly rounds by staff.  A) ADLs: Pt able to perform ADLs independently.  P) Pick-Up (room cleanliness): Pt's room clean and free of clutter.

## 2017-05-01 NOTE — BH Assessment (Addendum)
[facility] Assessment Progress Note  Per Thedore MinsMojeed Akintayo, MD, this pt does not require psychiatric hospitalization at this time.  Pt presents under IVC initiated by his mother, which Dr Jannifer FranklinAkintayo has rescinded.  [facility] to continue treatment at [facility].  This has been included in pt's discharge instructions.  Pt's nurse, Diane, has been notified.  Justin Canninghomas Tyriq Moragne, MA Triage Specialist [phone]

## 2017-05-01 NOTE — Discharge Instructions (Signed)
For your behavioral health needs, you are advised to continue treatment at [facility]: ° °     [facility] °     [address], [address] °     [place], [place] [address] °     [phone] °
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
## 2017-05-01 NOTE — ED Notes (Signed)
Pt discharged home, all belongings returned to pt who signed for same, denies SI/HI/AVH.  Discharge instructions read to pt who verbalized understanding. Pt not responding to internal stimuli, pt escorted to the emergency department exit. 

## 2017-05-01 NOTE — Telephone Encounter (Signed)
Second call placed to patient regarding referrals and medication, no answer on patient phone. Left message for patient to return call to office./ S.Kaelan Amble,CMA

## 2017-05-03 ENCOUNTER — Ambulatory Visit: Payer: Commercial Managed Care - PPO | Admitting: Family Medicine

## 2017-05-17 ENCOUNTER — Encounter: Payer: Self-pay | Admitting: Family Medicine

## 2017-05-17 ENCOUNTER — Ambulatory Visit (INDEPENDENT_AMBULATORY_CARE_PROVIDER_SITE_OTHER): Payer: Commercial Managed Care - PPO | Admitting: Family Medicine

## 2017-05-17 VITALS — BP 131/76 | HR 60 | Temp 98.1°F | Resp 18 | Ht 69.76 in | Wt 148.8 lb

## 2017-05-17 DIAGNOSIS — G4700 Insomnia, unspecified: Secondary | ICD-10-CM | POA: Diagnosis not present

## 2017-05-17 DIAGNOSIS — F4010 Social phobia, unspecified: Secondary | ICD-10-CM | POA: Diagnosis not present

## 2017-05-17 DIAGNOSIS — F1310 Sedative, hypnotic or anxiolytic abuse, uncomplicated: Secondary | ICD-10-CM | POA: Diagnosis not present

## 2017-05-17 DIAGNOSIS — F411 Generalized anxiety disorder: Secondary | ICD-10-CM

## 2017-05-17 MED ORDER — SERTRALINE HCL 50 MG PO TABS: 50.0000 mg | ORAL_TABLET | Freq: Every day | ORAL | 3 refills | Status: DC

## 2017-05-17 MED ORDER — DOXEPIN HCL 50 MG PO CAPS: 50.0000 mg | ORAL_CAPSULE | Freq: Every evening | ORAL | 0 refills | Status: DC | PRN

## 2017-05-17 MED ORDER — LORAZEPAM 1 MG PO TABS: 1.0000 mg | ORAL_TABLET | Freq: Four times a day (QID) | ORAL | 0 refills | Status: DC | PRN

## 2017-05-17 NOTE — Patient Instructions (Signed)
     IF you received an x-ray today, you will receive an invoice from [facility]. Please contact  Radiology at [phone] with questions or concerns regarding your invoice.   IF you received labwork today, you will receive an invoice from [facility]. Please contact [facility] at [phone] with questions or concerns regarding your invoice.   Our billing staff will not be able to assist you with questions regarding bills from these companies.  You will be contacted with the lab results as soon as they are available. The fastest way to get your results is to activate your My Chart account. Instructions are located on the last page of this paperwork. If you have not heard from us regarding the results in 2 weeks, please contact this office.     

## 2017-05-17 NOTE — Progress Notes (Deleted)
   Subjective:    Patient ID: Justin May, male    DOB: 09/04/1996, 21 y.o.   MRN: [id number]  HPI    Review of Systems     Objective:   Physical Exam        Assessment & Plan:
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
## 2017-05-23 ENCOUNTER — Other Ambulatory Visit: Payer: Self-pay | Admitting: Family Medicine

## 2017-05-28 ENCOUNTER — Ambulatory Visit: Payer: Self-pay | Admitting: Psychology

## 2017-05-29 ENCOUNTER — Telehealth: Payer: Self-pay

## 2017-05-29 NOTE — Telephone Encounter (Signed)
Received notice of approval for Lorazepam 1mg .

## 2017-06-02 ENCOUNTER — Telehealth: Payer: Self-pay | Admitting: Family Medicine

## 2017-06-02 NOTE — Telephone Encounter (Signed)
Pt called stating that he wants to try a low dose of gabapentin instead of the Zoloft.  He states that the Zoloft makes him feel like a zombie.  He is going to talk to her about this at the visit but he wanted this documented in his chart so she is aware.

## 2017-06-05 NOTE — Telephone Encounter (Signed)
Will discuss at visit, but in the interim I would like him to try to stay on a low dose of the zoloft - he could even just take 1/4 of a tab a day and let his body try to get used to that for a month or so. If he wants to try gabapentin, would be best to add in addition to a very low dose of the zoloft - not instead of.

## 2017-06-05 NOTE — Telephone Encounter (Signed)
Do you want him to start this before his visit ? Will discuss this with you further at his appointment. Thanks

## 2017-06-06 NOTE — Telephone Encounter (Signed)
Tried to call patient.  It sounded like someone answered, however, there were only noises of traffic and wind.  I tried saying hello, hello, but patient did not answer me.

## 2017-06-11 NOTE — Telephone Encounter (Signed)
Attempted to call. Pt not answering and voicemail not set up. Pt has scheduled appointment this week.

## 2017-06-14 ENCOUNTER — Ambulatory Visit (INDEPENDENT_AMBULATORY_CARE_PROVIDER_SITE_OTHER): Payer: Commercial Managed Care - PPO | Admitting: Family Medicine

## 2017-06-14 ENCOUNTER — Other Ambulatory Visit: Payer: Self-pay

## 2017-06-14 ENCOUNTER — Encounter: Payer: Self-pay | Admitting: Family Medicine

## 2017-06-14 VITALS — BP 130/75 | HR 56 | Temp 98.1°F | Resp 16 | Ht 69.25 in | Wt 151.4 lb

## 2017-06-14 DIAGNOSIS — F411 Generalized anxiety disorder: Secondary | ICD-10-CM | POA: Diagnosis not present

## 2017-06-14 DIAGNOSIS — F321 Major depressive disorder, single episode, moderate: Secondary | ICD-10-CM

## 2017-06-14 DIAGNOSIS — G4700 Insomnia, unspecified: Secondary | ICD-10-CM | POA: Diagnosis not present

## 2017-06-14 DIAGNOSIS — Z5181 Encounter for therapeutic drug level monitoring: Secondary | ICD-10-CM

## 2017-06-14 MED ORDER — LORAZEPAM 2 MG PO TABS: 2.0000 mg | ORAL_TABLET | Freq: Three times a day (TID) | ORAL | 0 refills | Status: DC | PRN

## 2017-06-14 MED ORDER — DOXEPIN HCL 50 MG PO CAPS: 50.0000 mg | ORAL_CAPSULE | Freq: Every evening | ORAL | 0 refills | Status: DC | PRN

## 2017-06-14 MED ORDER — GABAPENTIN 400 MG PO CAPS: 400.0000 mg | ORAL_CAPSULE | Freq: Three times a day (TID) | ORAL | 0 refills | Status: DC
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
## 2017-06-14 NOTE — Patient Instructions (Signed)
     IF you received an x-ray today, you will receive an invoice from [facility]. Please contact [facility] at [phone] with questions or concerns regarding your invoice.   IF you received labwork today, you will receive an invoice from [facility]. Please contact [facility] at [phone] with questions or concerns regarding your invoice.   Our billing staff will not be able to assist you with questions regarding bills from these companies.  You will be contacted with the lab results as soon as they are available. The fastest way to get your results is to activate your My Chart account. Instructions are located on the last page of this paperwork. If you have not heard from us regarding the results in 2 weeks, please contact this office.     

## 2017-06-15 ENCOUNTER — Other Ambulatory Visit: Payer: Self-pay | Admitting: Family Medicine

## 2017-06-20 LAB — TOXASSURE SELECT 13 (MW), URINE

## 2017-06-28 ENCOUNTER — Ambulatory Visit (HOSPITAL_COMMUNITY): Payer: Self-pay | Admitting: Licensed Clinical Social Worker

## 2017-06-29 ENCOUNTER — Encounter (HOSPITAL_COMMUNITY): Payer: Self-pay

## 2017-06-29 ENCOUNTER — Emergency Department (HOSPITAL_COMMUNITY): Payer: Commercial Managed Care - PPO

## 2017-06-29 ENCOUNTER — Emergency Department (HOSPITAL_COMMUNITY)
Admission: EM | Admit: 2017-06-29 | Discharge: 2017-06-30 | Disposition: A | Discharge status: Home / Self Care | Payer: Commercial Managed Care - PPO | Attending: Emergency Medicine | Admitting: Emergency Medicine

## 2017-06-29 DIAGNOSIS — F13930 Sedative, hypnotic or anxiolytic use, unspecified with withdrawal, uncomplicated: Secondary | ICD-10-CM

## 2017-06-29 DIAGNOSIS — J45909 Unspecified asthma, uncomplicated: Secondary | ICD-10-CM | POA: Insufficient documentation

## 2017-06-29 DIAGNOSIS — R569 Unspecified convulsions: Secondary | ICD-10-CM | POA: Diagnosis not present

## 2017-06-29 DIAGNOSIS — Z79899 Other long term (current) drug therapy: Secondary | ICD-10-CM | POA: Insufficient documentation

## 2017-06-29 DIAGNOSIS — F17210 Nicotine dependence, cigarettes, uncomplicated: Secondary | ICD-10-CM | POA: Insufficient documentation

## 2017-06-29 DIAGNOSIS — J029 Acute pharyngitis, unspecified: Secondary | ICD-10-CM | POA: Diagnosis not present

## 2017-06-29 DIAGNOSIS — F13230 Sedative, hypnotic or anxiolytic dependence with withdrawal, uncomplicated: Secondary | ICD-10-CM

## 2017-06-29 HISTORY — DX: Unspecified convulsions: R56.9

## 2017-06-29 MED ORDER — LORAZEPAM 2 MG/ML IJ SOLN
1.0000 mg | Freq: Once | INTRAMUSCULAR | Status: AC
  Administered 2017-06-30: 1 mg via INTRAVENOUS
  Filled 2017-06-29: qty 1

## 2017-06-29 NOTE — ED Triage Notes (Addendum)
Pt brought in by [facility] from home. Pt was sitting in a chair when roommate reports pt had a generalized seizure for 3-5 minutes. Per EMS pt has a hx of seizures, is prescribed ativan and gabapentin but ran out about a week ago. Pt is currently A&O x4. Pt reports feeling dizzy and having a headache before having the seizure. EMS reports no trauma noted as a result. Pt did not bite tongue or have an episode of incontinence.

## 2017-06-29 NOTE — ED Provider Notes (Signed)
[facility]-EMERGENCY DEPT Provider Note   CSN: [id number] Arrival date & time: 06/29/17  2319     History   Chief Complaint Chief Complaint  Patient presents with  . Seizures    HPI Justin May is a 21 y.o. male.  Patient presents via EMS after witnessed seizure activity. His roommate reports patient was sitting in a chair and had a generalized tonic-clonic seizure for about 3-5 minutes. The patient did not bite his tongue or urinate on himself. He was initially confused but is now back to baseline. Patient states her number is feeling dizzy and seeing stars. Denies any recent illnesses, vomiting or fever. Does have a sore throat. Contrary to triage note, patient does not have a history of previous seizures. He takes doxepin, gabapentin and Ativan for anxiety. He states he has been out of Ativan for about one week. He does not know why he ran out. He did drink a few alcoholic beverages today.He does Does not drink on a regular basis. Denies any pain.    The history is provided by the patient and a relative.  Seizures   Associated symptoms include headaches and sore throat. Pertinent negatives include no chest pain, no cough, no nausea and no vomiting.    Past Medical History:  Diagnosis Date  . Anxiety   . Asthma   . Seizures [facility])     Patient Active Problem List   Diagnosis Date Noted  . 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 Tobacco use disorder 04/14/2017  . 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 Alcohol abuse 04/14/2017  . Insomnia 04/14/2017  . Anxiety state 06/16/2016  . Social phobia 06/16/2016    History reviewed. No pertinent surgical history.     Home Medications    Prior to Admission medications   Medication Sig Start Date End Date Taking? Authorizing Provider  doxepin (SINEQUAN) 50 MG capsule TAKE 1-2 CAPSULES (50-100 MG TOTAL) BY MOUTH AT BEDTIME AS NEEDED (INSOMNIA). 06/15/17   Sherren Mocha, MD    gabapentin (NEURONTIN) 400 MG capsule Take 1 capsule (400 mg total) by mouth 3 (three) times daily. 06/14/17   Sherren Mocha, MD  LORazepam (ATIVAN) 2 MG tablet Take 1 tablet (2 mg total) by mouth every 8 (eight) hours as needed for anxiety. 06/14/17   Sherren Mocha, MD  sertraline (ZOLOFT) 50 MG tablet Take 1 tablet (50 mg total) by mouth daily. 05/17/17   Sherren Mocha, MD    Family History Family History  Problem Relation Age of Onset  . Hypertension Father     Social History Social History  Substance Use Topics  . Smoking status: Current Every Day Smoker    Packs/day: 1.00    Types: Cigarettes  . Smokeless tobacco: Never Used  . Alcohol use 0.6 oz/week    1 Cans of beer per week     Comment: pt states he drinks 3-4 days out of the week     Allergies   Patient has no known allergies.   Review of Systems Review of Systems  Constitutional: Negative for activity change, appetite change and fever.  HENT: Positive for sore throat. Negative for congestion and rhinorrhea.   Respiratory: Negative for cough, chest tightness and shortness of breath.   Cardiovascular: Negative for chest pain.  Gastrointestinal: Negative for abdominal pain, nausea and vomiting.  Genitourinary: Negative for dysuria, hematuria and testicular pain.  Musculoskeletal: Negative for arthralgias and myalgias.  Skin: Negative for  rash.  Neurological: Positive for dizziness, seizures and headaches.     Physical Exam Updated Vital Signs BP 135/87   Pulse 80   Temp 98.1 F (36.7 C)   Resp 18   Ht  (1.778 m)   Wt 68 kg (150 lb)   SpO2 100%   BMI 21.52 kg/m   Physical Exam  Constitutional: He is oriented to person, place, and time. He appears well-developed and well-nourished. No distress.  HENT:  Head: Normocephalic and atraumatic.  Mouth/Throat: Oropharynx is clear and moist. No oropharyngeal exudate.  No tongue trauma  Eyes: Pupils are equal, round, and reactive to light. Conjunctivae and EOM  are normal.  Neck: Normal range of motion. Neck supple.  No meningismus.  Cardiovascular: Normal rate, regular rhythm, normal heart sounds and intact distal pulses.   No murmur heard. Pulmonary/Chest: Effort normal and breath sounds normal. No respiratory distress.  Abdominal: Soft. There is no tenderness. There is no rebound and no guarding.  Musculoskeletal: Normal range of motion. He exhibits no edema or tenderness.  No T or L spine tenderness  Neurological: He is alert and oriented to person, place, and time. No cranial nerve deficit. He exhibits normal muscle tone. Coordination normal.  CN 2-12 intact, no ataxia on finger to nose, no nystagmus, 5/5 strength throughout, no pronator drift, Romberg negative, normal gait.   Skin: Skin is warm.  Psychiatric: He has a normal mood and affect. His behavior is normal.  Nursing note and vitals reviewed.    ED Treatments / Results  Labs (all labs ordered are listed, but only abnormal results are displayed) Labs Reviewed  COMPREHENSIVE METABOLIC PANEL - Abnormal; Notable for the following:       Result Value   AST 54 (*)    All other components within normal limits  RAPID STREP SCREEN (NOT AT [facility])  CULTURE, GROUP A STREP ([facility])  RAPID URINE DRUG SCREEN, HOSP PERFORMED  CBC WITH DIFFERENTIAL/PLATELET  ETHANOL    EKG  EKG Interpretation None       Radiology Ct Head Wo Contrast  Result Date: 06/30/2017 CLINICAL DATA:  Seizure, new, nontraumatic, 18-40 yrs. Seizure history, without medication for 1 week. EXAM: CT HEAD WITHOUT CONTRAST TECHNIQUE: Contiguous axial images were obtained from the base of the skull through the vertex without intravenous contrast. COMPARISON:  None. FINDINGS: Brain: No intracranial hemorrhage, mass effect, or midline shift. No hydrocephalus. The basilar cisterns are patent. No evidence of territorial infarct or acute ischemia. No extra-axial or intracranial fluid collection. Vascular: No hyperdense vessel  or unexpected calcification. Skull: No fracture or focal lesion. Sinuses/Orbits: Mild mucosal thickening of Celsius frontal sinuses, ethmoid air cells, and maxillary sinuses. No sinus fluid levels. Mastoid air cells are clear. Visualized orbits are normal. Other: None. IMPRESSION: 1.  No acute intracranial abnormality. 2. Mild paranasal sinus inflammation. Electronically Signed   By: Rubye Oaks M.D.   On: 06/30/2017 00:06    Procedures Procedures (including critical care time)  Medications Ordered in ED Medications - No data to display   Initial Impression / Assessment and Plan / ED Course  I have reviewed the triage vital signs and the nursing notes.  Pertinent labs & imaging results that were available during my care of the patient were reviewed by me and considered in my medical decision making (see chart for details).    Patient with apparent new onset seizure. No history of the same. He is neurologically intact and back to baseline. We'll obtain labs  and had imaging. May be due to benzodiazepine withdrawal as patient has not had his Ativan for about a week.  Controlled substance database reviewed. There are no prescriptions for benzodiazepines noticed in the past 6 months.  Per PCP records, patient received a prescription for 90 2 mg lorazepam tablets on September 13.discussed with patient that he should not be out of his Ativan already. He admits that he may be taking it more than prescribed. Denies selling it or anything to anyone else.  Patient is back to baseline at this time. Labs are within normal limits, CT head is negative. Discussed with patient that his seizure may be due to benzodiazepine withdrawal. Concerned he is using the benzodiazepines more than is prescribed.  Will be given short course to get him through the weekend and will send messages to PCP regarding patient's excessive benzodiazepine use. Patient instructed not to drive or operate heavy  machinery.  Patient tolerating by mouth and ambulatory. He is back to baseline and denies complaints. Advise follow with PCP and neurology. Discussed not using more than prescribed of his medications and follow-up with his PCP. No driving or operating heavy machinery. Return precautions discussed.  Final Clinical Impressions(s) / ED Diagnoses   Final diagnoses:  Seizures [facility])    New Prescriptions New Prescriptions   No medications on file     Glynn Octave, MD 06/30/17 [phone]

## 2017-06-29 NOTE — ED Notes (Signed)
Patient transported to CT 

## 2017-06-30 ENCOUNTER — Telehealth: Payer: Self-pay | Admitting: Family Medicine

## 2017-06-30 DIAGNOSIS — J029 Acute pharyngitis, unspecified: Secondary | ICD-10-CM | POA: Diagnosis not present

## 2017-06-30 DIAGNOSIS — R569 Unspecified convulsions: Secondary | ICD-10-CM | POA: Diagnosis not present

## 2017-06-30 LAB — COMPREHENSIVE METABOLIC PANEL
ALK PHOS: 69 U/L (ref 38–126)
ALT: 36 U/L (ref 17–63)
ANION GAP: 8 (ref 5–15)
AST: 54 U/L — ABNORMAL HIGH (ref 15–41)
Albumin: 4.4 g/dL (ref 3.5–5.0)
BILIRUBIN TOTAL: 0.7 mg/dL (ref 0.3–1.2)
BUN: 11 mg/dL (ref 6–20)
CALCIUM: 9.3 mg/dL (ref 8.9–10.3)
CO2: 24 mmol/L (ref 22–32)
Chloride: 103 mmol/L (ref 101–111)
Creatinine, Ser: 1.04 mg/dL (ref 0.61–1.24)
Glucose, Bld: 78 mg/dL (ref 65–99)
Potassium: 3.8 mmol/L (ref 3.5–5.1)
Sodium: 135 mmol/L (ref 135–145)
TOTAL PROTEIN: 6.6 g/dL (ref 6.5–8.1)

## 2017-06-30 LAB — CBC WITH DIFFERENTIAL/PLATELET
Basophils Absolute: 0 10*3/uL (ref 0.0–0.1)
Basophils Relative: 1 %
EOS ABS: 0.6 10*3/uL (ref 0.0–0.7)
Eosinophils Relative: 9 %
HEMATOCRIT: 46 % (ref 39.0–52.0)
HEMOGLOBIN: 15.8 g/dL (ref 13.0–17.0)
LYMPHS ABS: 2.3 10*3/uL (ref 0.7–4.0)
Lymphocytes Relative: 35 %
MCH: 31.4 pg (ref 26.0–34.0)
MCHC: 34.3 g/dL (ref 30.0–36.0)
MCV: 91.5 fL (ref 78.0–100.0)
MONOS PCT: 7 %
Monocytes Absolute: 0.5 10*3/uL (ref 0.1–1.0)
NEUTROS ABS: 3.2 10*3/uL (ref 1.7–7.7)
NEUTROS PCT: 48 %
Platelets: 202 10*3/uL (ref 150–400)
RBC: 5.03 MIL/uL (ref 4.22–5.81)
RDW: 12.9 % (ref 11.5–15.5)
WBC: 6.6 10*3/uL (ref 4.0–10.5)

## 2017-06-30 LAB — ETHANOL: Alcohol, Ethyl (B): <10 mg/dL (ref <10)

## 2017-06-30 LAB — RAPID STREP SCREEN (MED CTR MEBANE ONLY): STREPTOCOCCUS, GROUP A SCREEN (DIRECT): NEGATIVE

## 2017-06-30 LAB — RAPID URINE DRUG SCREEN, HOSP PERFORMED
AMPHETAMINES: NOT DETECTED
Barbiturates: NOT DETECTED
Benzodiazepines: NOT DETECTED
Cocaine: NOT DETECTED
OPIATES: NOT DETECTED
Tetrahydrocannabinol: NOT DETECTED

## 2017-06-30 MED ORDER — LORAZEPAM 2 MG PO TABS: 2.0000 mg | ORAL_TABLET | Freq: Three times a day (TID) | ORAL | 0 refills | Status: DC | PRN

## 2017-06-30 NOTE — Discharge Instructions (Signed)
You may have had a seizure from benzodiazepine withdrawal. Do not take your medications more than prescribed. Do not drive or operate heavy machinery until cleared by neurology. Follow-up with Dr. Clelia Croft as well as the neurologist. Return to the ED if you develop new or worsening symptoms.

## 2017-06-30 NOTE — ED Notes (Signed)
Pt ambulated with a steady gait and with no complaints. Pt tolerating fluids well.

## 2017-06-30 NOTE — Telephone Encounter (Signed)
Please sched pt to see me ASAP this wk - if any cancellations on Mon - need to see pt at that slot. If not, than on Wed.

## 2017-06-30 NOTE — Telephone Encounter (Signed)
Pt is not going to have enough medication to get him thru to Wednesday and they are wanting to know if he needs to be seen Monday instead of Wednesday   The medication is ativan and the ER believes that pt had the seizure because he was out and they have only given him enough for three days  Best number [phone]

## 2017-07-01 ENCOUNTER — Emergency Department (HOSPITAL_COMMUNITY)
Admission: EM | Admit: 2017-07-01 | Discharge: 2017-07-02 | Disposition: A | Discharge status: Other Acute Inpatient Hospital | Payer: Commercial Managed Care - PPO | Attending: Emergency Medicine | Admitting: Emergency Medicine

## 2017-07-01 DIAGNOSIS — F10920 Alcohol use, unspecified with intoxication, uncomplicated: Secondary | ICD-10-CM | POA: Diagnosis not present

## 2017-07-01 DIAGNOSIS — F419 Anxiety disorder, unspecified: Secondary | ICD-10-CM | POA: Diagnosis not present

## 2017-07-01 DIAGNOSIS — F17210 Nicotine dependence, cigarettes, uncomplicated: Secondary | ICD-10-CM | POA: Diagnosis not present

## 2017-07-01 DIAGNOSIS — R4182 Altered mental status, unspecified: Secondary | ICD-10-CM | POA: Diagnosis present

## 2017-07-01 DIAGNOSIS — Y908 Blood alcohol level of 240 mg/100 ml or more: Secondary | ICD-10-CM | POA: Diagnosis not present

## 2017-07-01 DIAGNOSIS — F1910 Other psychoactive substance abuse, uncomplicated: Secondary | ICD-10-CM | POA: Insufficient documentation

## 2017-07-01 DIAGNOSIS — R450 Nervousness: Secondary | ICD-10-CM | POA: Diagnosis not present

## 2017-07-01 DIAGNOSIS — R451 Restlessness and agitation: Secondary | ICD-10-CM | POA: Diagnosis not present

## 2017-07-01 DIAGNOSIS — R404 Transient alteration of awareness: Secondary | ICD-10-CM | POA: Diagnosis not present

## 2017-07-01 DIAGNOSIS — F1094 Alcohol use, unspecified with alcohol-induced mood disorder: Secondary | ICD-10-CM | POA: Diagnosis present

## 2017-07-01 DIAGNOSIS — R9431 Abnormal electrocardiogram [ECG] [EKG]: Secondary | ICD-10-CM | POA: Diagnosis not present

## 2017-07-01 DIAGNOSIS — R531 Weakness: Secondary | ICD-10-CM | POA: Diagnosis not present

## 2017-07-01 LAB — RAPID URINE DRUG SCREEN, HOSP PERFORMED
AMPHETAMINES: NOT DETECTED
BENZODIAZEPINES: NOT DETECTED
Barbiturates: NOT DETECTED
Cocaine: NOT DETECTED
OPIATES: NOT DETECTED
TETRAHYDROCANNABINOL: NOT DETECTED

## 2017-07-01 LAB — COMPREHENSIVE METABOLIC PANEL
ALK PHOS: 86 U/L (ref 38–126)
ALT: 38 U/L (ref 17–63)
AST: 54 U/L — AB (ref 15–41)
Albumin: 4.7 g/dL (ref 3.5–5.0)
Anion gap: 16 — ABNORMAL HIGH (ref 5–15)
BUN: 10 mg/dL (ref 6–20)
CHLORIDE: 106 mmol/L (ref 101–111)
CO2: 21 mmol/L — AB (ref 22–32)
Calcium: 9.3 mg/dL (ref 8.9–10.3)
Creatinine, Ser: 1 mg/dL (ref 0.61–1.24)
GFR calc Af Amer: >60 mL/min (ref >60)
GLUCOSE: 91 mg/dL (ref 65–99)
POTASSIUM: 3.4 mmol/L — AB (ref 3.5–5.1)
Sodium: 143 mmol/L (ref 135–145)
Total Bilirubin: 0.5 mg/dL (ref 0.3–1.2)
Total Protein: 7.7 g/dL (ref 6.5–8.1)

## 2017-07-01 LAB — CBC WITH DIFFERENTIAL/PLATELET
BASOS PCT: 0 %
Basophils Absolute: 0 10*3/uL (ref 0.0–0.1)
EOS ABS: 0.6 10*3/uL (ref 0.0–0.7)
Eosinophils Relative: 5 %
HCT: 49.7 % (ref 39.0–52.0)
Hemoglobin: 17.6 g/dL — ABNORMAL HIGH (ref 13.0–17.0)
Lymphocytes Relative: 46 %
Lymphs Abs: 5.4 10*3/uL — ABNORMAL HIGH (ref 0.7–4.0)
MCH: 32.7 pg (ref 26.0–34.0)
MCHC: 35.4 g/dL (ref 30.0–36.0)
MCV: 92.2 fL (ref 78.0–100.0)
MONO ABS: 1 10*3/uL (ref 0.1–1.0)
MONOS PCT: 8 %
Neutro Abs: 5 10*3/uL (ref 1.7–7.7)
Neutrophils Relative %: 41 %
Platelets: 275 10*3/uL (ref 150–400)
RBC: 5.39 MIL/uL (ref 4.22–5.81)
RDW: 13.1 % (ref 11.5–15.5)
WBC: 12 10*3/uL — ABNORMAL HIGH (ref 4.0–10.5)

## 2017-07-01 LAB — ACETAMINOPHEN LEVEL: Acetaminophen (Tylenol), Serum: <10 ug/mL — ABNORMAL LOW (ref 10–30)

## 2017-07-01 LAB — CBG MONITORING, ED: Glucose-Capillary: 117 mg/dL — ABNORMAL HIGH (ref 65–99)

## 2017-07-01 LAB — ETHANOL: Alcohol, Ethyl (B): 260 mg/dL — ABNORMAL HIGH (ref <10)

## 2017-07-01 LAB — SALICYLATE LEVEL: Salicylate Lvl: <7 mg/dL (ref 2.8–30.0)

## 2017-07-01 MED ORDER — HALOPERIDOL LACTATE 5 MG/ML IJ SOLN: 2.0000 mg | Freq: Once | INTRAMUSCULAR | Status: DC

## 2017-07-01 MED ORDER — ONDANSETRON HCL 4 MG PO TABS: 4.0000 mg | ORAL_TABLET | Freq: Three times a day (TID) | ORAL | Status: DC | PRN

## 2017-07-01 MED ORDER — ALUM & MAG HYDROXIDE-SIMETH 200-200-20 MG/5ML PO SUSP: 30.0000 mL | Freq: Four times a day (QID) | ORAL | Status: DC | PRN

## 2017-07-01 MED ORDER — SODIUM CHLORIDE 0.9 % IV SOLN
INTRAVENOUS | Status: DC
  Administered 2017-07-01: 05:00:00 via INTRAVENOUS

## 2017-07-01 MED ORDER — TRAZODONE HCL 50 MG PO TABS
50.0000 mg | ORAL_TABLET | Freq: Every day | ORAL | Status: DC
  Administered 2017-07-01: 50 mg via ORAL
  Filled 2017-07-01: qty 1

## 2017-07-01 MED ORDER — GABAPENTIN 300 MG PO CAPS
300.0000 mg | ORAL_CAPSULE | Freq: Three times a day (TID) | ORAL | Status: DC
  Administered 2017-07-01 – 2017-07-02 (×4): 300 mg via ORAL
  Filled 2017-07-01 (×4): qty 1

## 2017-07-01 MED ORDER — SODIUM CHLORIDE 0.9 % IV BOLUS (SEPSIS)
1000.0000 mL | Freq: Once | INTRAVENOUS | Status: AC
  Administered 2017-07-01: 1000 mL via INTRAVENOUS

## 2017-07-01 MED ORDER — HALOPERIDOL LACTATE 5 MG/ML IJ SOLN
2.0000 mg | Freq: Once | INTRAMUSCULAR | Status: AC
  Administered 2017-07-01: 2 mg via INTRAVENOUS

## 2017-07-01 MED ORDER — GABAPENTIN 400 MG PO CAPS: 400.0000 mg | ORAL_CAPSULE | Freq: Three times a day (TID) | ORAL | Status: DC

## 2017-07-01 MED ORDER — AMMONIA AROMATIC IN INHA
0.3000 mL | Freq: Once | RESPIRATORY_TRACT | Status: AC
  Administered 2017-07-01: 0.3 mL via RESPIRATORY_TRACT

## 2017-07-01 MED ORDER — ACETAMINOPHEN 325 MG PO TABS: 650.0000 mg | ORAL_TABLET | ORAL | Status: DC | PRN

## 2017-07-01 MED ORDER — HALOPERIDOL LACTATE 5 MG/ML IJ SOLN
INTRAMUSCULAR | Status: AC
  Filled 2017-07-01: qty 1

## 2017-07-01 MED ORDER — SODIUM CHLORIDE 0.9 % IV BOLUS (SEPSIS)
1000.0000 mL | Freq: Once | INTRAVENOUS | Status: AC
Start: 2017-07-01 — End: 2017-07-01
  Administered 2017-07-01: 1000 mL via INTRAVENOUS

## 2017-07-01 MED ORDER — HALOPERIDOL LACTATE 5 MG/ML IJ SOLN
5.0000 mg | Freq: Once | INTRAMUSCULAR | Status: AC
  Administered 2017-07-01: 5 mg via INTRAMUSCULAR

## 2017-07-01 NOTE — ED Notes (Signed)
Nydia Bouton [phone] room mate
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
## 2017-07-01 NOTE — ED Notes (Signed)
Bed: RESA Expected date:  Expected time:  Means of arrival:  Comments: 
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
## 2017-07-01 NOTE — Patient Outreach (Signed)
ED Peer Support Specialist Patient Intake (Complete at intake & 30-60 Day Follow-up)  Name: Justin May  MRN: [id number]  Age: 21 y.o.   Date of Admission: 07/01/2017  Intake: Initial Comments:     Primary Reason Admitted: Pt has a history of substance induced aggression and was later IVC'd by the EDP due to aggressive behavior and poor judgement. Pt denies SI, HI, AVH, and said he drank 1 beer last night with his roommate. Pt denies mental health history, but that contradicts what is in his chart which indicate a past visit when his mom brought him in to [facility] to get help.   Lab values: Alcohol/ETOH: Positive Positive UDS? No Amphetamines: No Barbiturates: No Benzodiazepines: No Cocaine: No Opiates: No Cannabinoids: Yes  Demographic information: Gender: Male Ethnicity: White Marital Status: Single Insurance Status: Patent attorney (Work Engineer, agricultural, Sales executive, etc.: No Lives with: Friend/Rommate Living situation: House/Apartment  Reported Patient History: Patient reported health conditions:   Patient aware of HIV and hepatitis status: No  In past year, has patient visited ED for any reason? Yes (Substance Induced Mood disorder )  Number of ED visits: 2  Reason(s) for visit: Substance Induced Mood disorder  In past year, has patient been hospitalized for any reason? Yes  Number of hospitalizations: 1  Reason(s) for hospitalization: Substance Induced Mood disorder  In past year, has patient been arrested? No  Number of arrests:    Reason(s) for arrest:    In past year, has patient been incarcerated? No  Number of incarcerations:    Reason(s) for incarceration:    In past year, has patient received medication-assisted treatment? No  In past year, patient received the following treatments: Residential treatment (non-hospital)  In past year, has patient received any harm reduction services? No  Did this include any of the  following?    In past year, has patient received care from a mental health provider for diagnosis other than SUD? No  In past year, is this first time patient has overdosed? No  Number of past overdoses: 0  In past year, is this first time patient has been hospitalized for an overdose? No  Number of hospitalizations for overdose(s): 0  Is patient currently receiving treatment for a mental health diagnosis? No  Patient reports experiencing difficulty participating in SUD treatment: Yes    Most important reason(s) for this difficulty? Personal safety  Has patient received prior services for treatment? Yes  In past, patient has received services from following agencies: [facility]), [facility]), [facility] ([facility])  Plan of Care:  Suggested follow up at these agencies/treatment centers: [facility] ([facility]), [facility])  Other information: Spoke with Patient and discussed the importance of him taking the treatment services serious. CPSS made Patient aware that this matter can be managed but it is up to Patient to utilize the help offered. CPSS Arlys John gave Patient a card and made him aware of the support that [facility]PSS can provide when he needs it.   Arlys John Rhylen Pulido, CPSS  07/01/2017 4:59 PM

## 2017-07-01 NOTE — ED Notes (Signed)
Pts mother called after going home to check med bottles and verified meds were still there and amount correct in bottles.

## 2017-07-01 NOTE — ED Triage Notes (Signed)
Per EMS, pt. Reported of syncopal episode at around 0130 this morning, pt. Was seen at [facility] yesterday for drug overdose, discharged to home yesterday a.m. Per pt.'s room mate ,pt. Acting bizarre ,confused, pt. Took prescribed Ativan and gabapentin but unknown dose. Pt. Reported   having a seizure like activity which  lasted few minute before syncopal episode. . Pt. Was confused upon arrival to ED. uncooperative.

## 2017-07-01 NOTE — ED Notes (Signed)
Pt now awake. Pt reports drinking alcohol is what brought him to the hospital, pt denies withdrawal symptoms, no s/s of withdrawal symptoms.. Pt denies SI/HI/AVH, appears to be minimizing. Encouragement and support provided. Special checks q 15 mins in place for safety, Video monitoring in place. Will continue to monitor.
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
## 2017-07-01 NOTE — ED Notes (Signed)
Pt immediately awakened with ammonia inhalant and is now changing into scrubs for [facility] transfer

## 2017-07-01 NOTE — ED Notes (Signed)
Pt watching TV with visitor appears calm and cooperative on approach. Pt denies withdrawal symptoms. Denies  SI/HI/AVH and pain. Support and encouragement offered as needed. Will continue to monitor.

## 2017-07-01 NOTE — ED Notes (Signed)
Pt admitted to room #39. This nurse in pt room to assess pt. Pt laying down, eyes closed, sleeping. Breathing non labored, respirations equal. Special checks q 15 mins in place for safety, Video monitoring in place. Will continue to monitor.

## 2017-07-01 NOTE — ED Provider Notes (Signed)
[facility]-EMERGENCY DEPT Provider Note   CSN: [id number] Arrival date & time: 07/01/17  0158     History   Chief Complaint Chief Complaint  Patient presents with  . Loss of Consciousness    HPI Justin May is a 21 y.o. male.  The history is provided by the EMS personnel. The history is limited by the condition of the patient.  Loss of Consciousness   This is a recurrent problem. The current episode started less than 1 hour ago. The problem occurs constantly. The problem has been resolved. Associated with: alcohol and unknown substances  Pertinent negatives include abdominal pain and palpitations. Associated symptoms comments: agitation. He has tried nothing for the symptoms. The treatment provided significant relief.  Seen last night for a seizure.  Reportedly drinking and ingesting unknown substances and EMS called reportedly by roommate.  Patient is agitated and belligerent.    Past Medical History:  Diagnosis Date  . Anxiety   . Asthma   . Seizures [facility])     Patient Active Problem List   Diagnosis Date Noted  . 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 Tobacco use disorder 04/14/2017  . 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 Alcohol abuse 04/14/2017  . Insomnia 04/14/2017  . Anxiety state 06/16/2016  . Social phobia 06/16/2016    No past surgical history on file.     Home Medications    Prior to Admission medications   Medication Sig Start Date End Date Taking? Authorizing Provider  doxepin (SINEQUAN) 50 MG capsule TAKE 1-2 CAPSULES (50-100 MG TOTAL) BY MOUTH AT BEDTIME AS NEEDED (INSOMNIA). 06/15/17  Yes Sherren Mocha, MD  gabapentin (NEURONTIN) 400 MG capsule Take 1 capsule (400 mg total) by mouth 3 (three) times daily. 06/14/17  Yes Sherren Mocha, MD  LORazepam (ATIVAN) 2 MG tablet Take 1 tablet (2 mg total) by mouth every 8 (eight) hours as needed for anxiety. 06/30/17  Yes Rancour, Jeannett Senior, MD  sertraline (ZOLOFT) 50  MG tablet Take 1 tablet (50 mg total) by mouth daily. 05/17/17  Yes Sherren Mocha, MD    Family History Family History  Problem Relation Age of Onset  . Hypertension Father     Social History Social History  Substance Use Topics  . Smoking status: Current Every Day Smoker    Packs/day: 1.00    Types: Cigarettes  . Smokeless tobacco: Never Used  . Alcohol use 0.6 oz/week    1 Cans of beer per week     Comment: pt states he drinks 3-4 days out of the week     Allergies   Patient has no known allergies.   Review of Systems Review of Systems  Respiratory: Negative for wheezing.   Cardiovascular: Positive for syncope. Negative for palpitations.  Gastrointestinal: Negative for abdominal pain.  Psychiatric/Behavioral: Positive for agitation and behavioral problems.  All other systems reviewed and are negative.    Physical Exam Updated Vital Signs BP (!) 109/38   Pulse 79   Resp 14   SpO2 98%   Physical Exam  Constitutional: He is oriented to person, place, and time. He appears well-developed and well-nourished. No distress.  HENT:  Head: Normocephalic and atraumatic.  Nose: Nose normal.  Mouth/Throat: No oropharyngeal exudate.  Eyes: Conjunctivae are normal.  10 mm pupils in the light with rotatory nystagmus  Neck: Normal range of motion. Neck supple. No JVD present.  Cardiovascular: Normal rate, regular rhythm, normal heart sounds and intact  distal pulses.   Pulmonary/Chest: Effort normal and breath sounds normal. He has no wheezes. He has no rales.  Abdominal: Soft. Bowel sounds are normal. He exhibits no mass. There is no tenderness. There is no rebound and no guarding.  Musculoskeletal: Normal range of motion.  Neurological: He is alert and oriented to person, place, and time. He displays normal reflexes.  Skin: Skin is warm and dry. Capillary refill takes less than 2 seconds.  Psychiatric: He has a normal mood and affect.     ED Treatments / Results    Vitals:   07/01/17 0344 07/01/17 0350  BP:    Pulse: 80 79  Resp: 14 14  SpO2: 98% 98%  ;s  Labs (all labs ordered are listed, but only abnormal results are displayed)  Results for orders placed or performed during the hospital encounter of 07/01/17  Comprehensive metabolic panel  Result Value Ref Range   Sodium 143 135 - 145 mmol/L   Potassium 3.4 (L) 3.5 - 5.1 mmol/L   Chloride 106 101 - 111 mmol/L   CO2 21 (L) 22 - 32 mmol/L   Glucose, Bld 91 65 - 99 mg/dL   BUN 10 6 - 20 mg/dL   Creatinine, Ser 1.61 0.61 - 1.24 mg/dL   Calcium 9.3 8.9 - 09.6 mg/dL   Total Protein 7.7 6.5 - 8.1 g/dL   Albumin 4.7 3.5 - 5.0 g/dL   AST 54 (H) 15 - 41 U/L   ALT 38 17 - 63 U/L   Alkaline Phosphatase 86 38 - 126 U/L   Total Bilirubin 0.5 0.3 - 1.2 mg/dL   GFR calc non Af Amer >60 >60 mL/min   GFR calc Af Amer >60 >60 mL/min   Anion gap 16 (H) 5 - 15  Salicylate level  Result Value Ref Range   Salicylate Lvl <7.0 2.8 - 30.0 mg/dL  Acetaminophen level  Result Value Ref Range   Acetaminophen (Tylenol), Serum <10 (L) 10 - 30 ug/mL  Ethanol  Result Value Ref Range   Alcohol, Ethyl (B) 260 (H) <10 mg/dL  Urine rapid drug screen (hosp performed)  Result Value Ref Range   Opiates NONE DETECTED NONE DETECTED   Cocaine NONE DETECTED NONE DETECTED   Benzodiazepines NONE DETECTED NONE DETECTED   Amphetamines NONE DETECTED NONE DETECTED   Tetrahydrocannabinol NONE DETECTED NONE DETECTED   Barbiturates NONE DETECTED NONE DETECTED  CBC WITH DIFFERENTIAL  Result Value Ref Range   WBC 12.0 (H) 4.0 - 10.5 K/uL   RBC 5.39 4.22 - 5.81 MIL/uL   Hemoglobin 17.6 (H) 13.0 - 17.0 g/dL   HCT 04.5 40.9 - 81.1 %   MCV 92.2 78.0 - 100.0 fL   MCH 32.7 26.0 - 34.0 pg   MCHC 35.4 30.0 - 36.0 g/dL   RDW 91.4 78.2 - 95.6 %   Platelets 275 150 - 400 K/uL   Neutrophils Relative % 41 %   Neutro Abs 5.0 1.7 - 7.7 K/uL   Lymphocytes Relative 46 %   Lymphs Abs 5.4 (H) 0.7 - 4.0 K/uL   Monocytes Relative 8 %    Monocytes Absolute 1.0 0.1 - 1.0 K/uL   Eosinophils Relative 5 %   Eosinophils Absolute 0.6 0.0 - 0.7 K/uL   Basophils Relative 0 %   Basophils Absolute 0.0 0.0 - 0.1 K/uL  CBG monitoring, ED  Result Value Ref Range   Glucose-Capillary 117 (H) 65 - 99 mg/dL   Ct Head Wo Contrast  Result Date: 06/30/2017  CLINICAL DATA:  Seizure, new, nontraumatic, 18-40 yrs. Seizure history, without medication for 1 week. EXAM: CT HEAD WITHOUT CONTRAST TECHNIQUE: Contiguous axial images were obtained from the base of the skull through the vertex without intravenous contrast. COMPARISON:  None. FINDINGS: Brain: No intracranial hemorrhage, mass effect, or midline shift. No hydrocephalus. The basilar cisterns are patent. No evidence of territorial infarct or acute ischemia. No extra-axial or intracranial fluid collection. Vascular: No hyperdense vessel or unexpected calcification. Skull: No fracture or focal lesion. Sinuses/Orbits: Mild mucosal thickening of Celsius frontal sinuses, ethmoid air cells, and maxillary sinuses. No sinus fluid levels. Mastoid air cells are clear. Visualized orbits are normal. Other: None. IMPRESSION: 1.  No acute intracranial abnormality. 2. Mild paranasal sinus inflammation. Electronically Signed   By: Rubye Oaks M.D.   On: 06/30/2017 00:06    EKG  EKG Interpretation  Date/Time:  Sunday July 01 2017 02:02:48 EDT Ventricular Rate:  94 PR Interval:    QRS Duration: 112 QT Interval:  366 QTC Calculation: 458 R Axis:   104 Text Interpretation:  Sinus rhythm Borderline intraventricular conduction delay Confirmed by Nicanor Alcon, Ivan Maskell ([id number]) on 07/01/2017 2:10:31 AM       Radiology Ct Head Wo Contrast  Result Date: 06/30/2017 CLINICAL DATA:  Seizure, new, nontraumatic, 18-40 yrs. Seizure history, without medication for 1 week. EXAM: CT HEAD WITHOUT CONTRAST TECHNIQUE: Contiguous axial images were obtained from the base of the skull through the vertex without intravenous  contrast. COMPARISON:  None. FINDINGS: Brain: No intracranial hemorrhage, mass effect, or midline shift. No hydrocephalus. The basilar cisterns are patent. No evidence of territorial infarct or acute ischemia. No extra-axial or intracranial fluid collection. Vascular: No hyperdense vessel or unexpected calcification. Skull: No fracture or focal lesion. Sinuses/Orbits: Mild mucosal thickening of Celsius frontal sinuses, ethmoid air cells, and maxillary sinuses. No sinus fluid levels. Mastoid air cells are clear. Visualized orbits are normal. Other: None. IMPRESSION: 1.  No acute intracranial abnormality. 2. Mild paranasal sinus inflammation. Electronically Signed   By: Rubye Oaks M.D.   On: 06/30/2017 00:06    Procedures Procedures (including critical care time)  Medications Ordered in ED Medications  sodium chloride 0.9 % bolus 1,000 mL (1,000 mLs Intravenous New Bag/Given 07/01/17 0236)    And  sodium chloride 0.9 % bolus 1,000 mL (0 mLs Intravenous Stopped 07/01/17 0352)    And  0.9 %  sodium chloride infusion (not administered)  haloperidol lactate (HALDOL) injection 5 mg (5 mg Intramuscular Given 07/01/17 0214)  haloperidol lactate (HALDOL) injection 2 mg (2 mg Intravenous Given 07/01/17 0234)      Final Clinical Impressions(s) / ED Diagnoses  Committed for violent behavior and trying to leave while under the influence of ETOH and unknown substances.  Medically clear for psychiatry   Matthew Cina, MD 07/01/17 [phone]

## 2017-07-02 ENCOUNTER — Telehealth: Payer: Self-pay | Admitting: Family Medicine

## 2017-07-02 ENCOUNTER — Ambulatory Visit: Payer: Commercial Managed Care - PPO | Admitting: Family Medicine

## 2017-07-02 ENCOUNTER — Encounter (HOSPITAL_COMMUNITY): Payer: Self-pay | Admitting: *Deleted

## 2017-07-02 ENCOUNTER — Inpatient Hospital Stay (HOSPITAL_COMMUNITY)
Admission: AD | Admit: 2017-07-02 | Discharge: 2017-07-06 | DRG: 897 | Disposition: A | Discharge status: Home / Self Care | Payer: Commercial Managed Care - PPO | Attending: Psychiatry | Admitting: Psychiatry

## 2017-07-02 DIAGNOSIS — F411 Generalized anxiety disorder: Secondary | ICD-10-CM | POA: Diagnosis present

## 2017-07-02 DIAGNOSIS — Z23 Encounter for immunization: Secondary | ICD-10-CM

## 2017-07-02 DIAGNOSIS — F17210 Nicotine dependence, cigarettes, uncomplicated: Secondary | ICD-10-CM | POA: Diagnosis present

## 2017-07-02 DIAGNOSIS — J45909 Unspecified asthma, uncomplicated: Secondary | ICD-10-CM | POA: Diagnosis present

## 2017-07-02 DIAGNOSIS — F4010 Social phobia, unspecified: Secondary | ICD-10-CM | POA: Diagnosis present

## 2017-07-02 DIAGNOSIS — F4325 Adjustment disorder with mixed disturbance of emotions and conduct: Secondary | ICD-10-CM | POA: Diagnosis present

## 2017-07-02 DIAGNOSIS — R569 Unspecified convulsions: Secondary | ICD-10-CM | POA: Diagnosis present

## 2017-07-02 DIAGNOSIS — F1310 Sedative, hypnotic or anxiolytic abuse, uncomplicated: Secondary | ICD-10-CM | POA: Diagnosis present

## 2017-07-02 DIAGNOSIS — F329 Major depressive disorder, single episode, unspecified: Secondary | ICD-10-CM | POA: Diagnosis present

## 2017-07-02 DIAGNOSIS — R110 Nausea: Secondary | ICD-10-CM | POA: Diagnosis not present

## 2017-07-02 DIAGNOSIS — F419 Anxiety disorder, unspecified: Secondary | ICD-10-CM

## 2017-07-02 DIAGNOSIS — R109 Unspecified abdominal pain: Secondary | ICD-10-CM | POA: Diagnosis not present

## 2017-07-02 DIAGNOSIS — Z79899 Other long term (current) drug therapy: Secondary | ICD-10-CM | POA: Diagnosis not present

## 2017-07-02 DIAGNOSIS — R001 Bradycardia, unspecified: Secondary | ICD-10-CM | POA: Diagnosis present

## 2017-07-02 DIAGNOSIS — G4700 Insomnia, unspecified: Secondary | ICD-10-CM | POA: Diagnosis present

## 2017-07-02 DIAGNOSIS — F1020 Alcohol dependence, uncomplicated: Secondary | ICD-10-CM | POA: Diagnosis not present

## 2017-07-02 DIAGNOSIS — R5383 Other fatigue: Secondary | ICD-10-CM

## 2017-07-02 DIAGNOSIS — R450 Nervousness: Secondary | ICD-10-CM | POA: Diagnosis not present

## 2017-07-02 DIAGNOSIS — R451 Restlessness and agitation: Secondary | ICD-10-CM

## 2017-07-02 DIAGNOSIS — F1024 Alcohol dependence with alcohol-induced mood disorder: Principal | ICD-10-CM | POA: Diagnosis present

## 2017-07-02 DIAGNOSIS — R251 Tremor, unspecified: Secondary | ICD-10-CM

## 2017-07-02 DIAGNOSIS — Z8249 Family history of ischemic heart disease and other diseases of the circulatory system: Secondary | ICD-10-CM | POA: Diagnosis not present

## 2017-07-02 DIAGNOSIS — R5381 Other malaise: Secondary | ICD-10-CM

## 2017-07-02 DIAGNOSIS — F1094 Alcohol use, unspecified with alcohol-induced mood disorder: Secondary | ICD-10-CM | POA: Diagnosis not present

## 2017-07-02 DIAGNOSIS — F10239 Alcohol dependence with withdrawal, unspecified: Secondary | ICD-10-CM | POA: Diagnosis present

## 2017-07-02 DIAGNOSIS — F1910 Other psychoactive substance abuse, uncomplicated: Secondary | ICD-10-CM | POA: Diagnosis not present

## 2017-07-02 DIAGNOSIS — R4587 Impulsiveness: Secondary | ICD-10-CM | POA: Diagnosis not present

## 2017-07-02 DIAGNOSIS — R404 Transient alteration of awareness: Secondary | ICD-10-CM | POA: Diagnosis not present

## 2017-07-02 DIAGNOSIS — Z818 Family history of other mental and behavioral disorders: Secondary | ICD-10-CM

## 2017-07-02 LAB — CULTURE, GROUP A STREP (THRC)

## 2017-07-02 MED ORDER — MAGNESIUM HYDROXIDE 400 MG/5ML PO SUSP: 30.0000 mL | Freq: Every day | ORAL | Status: DC | PRN

## 2017-07-02 MED ORDER — ALUM & MAG HYDROXIDE-SIMETH 200-200-20 MG/5ML PO SUSP: 30.0000 mL | ORAL | Status: DC | PRN

## 2017-07-02 MED ORDER — ONDANSETRON 4 MG PO TBDP: 4.0000 mg | ORAL_TABLET | Freq: Four times a day (QID) | ORAL | Status: DC | PRN

## 2017-07-02 MED ORDER — HYDROXYZINE HCL 25 MG PO TABS: 25.0000 mg | ORAL_TABLET | Freq: Four times a day (QID) | ORAL | Status: DC | PRN

## 2017-07-02 MED ORDER — INFLUENZA VAC SPLIT QUAD 0.5 ML IM SUSY
0.5000 mL | PREFILLED_SYRINGE | INTRAMUSCULAR | Status: AC
  Administered 2017-07-03: 0.5 mL via INTRAMUSCULAR
  Filled 2017-07-02: qty 0.5

## 2017-07-02 MED ORDER — TRAZODONE HCL 50 MG PO TABS
50.0000 mg | ORAL_TABLET | Freq: Every day | ORAL | Status: DC
  Administered 2017-07-02: 50 mg via ORAL
  Filled 2017-07-02 (×3): qty 1

## 2017-07-02 MED ORDER — LORAZEPAM 1 MG PO TABS: 1.0000 mg | ORAL_TABLET | Freq: Three times a day (TID) | ORAL | Status: DC

## 2017-07-02 MED ORDER — LOPERAMIDE HCL 2 MG PO CAPS: 2.0000 mg | ORAL_CAPSULE | ORAL | Status: DC | PRN

## 2017-07-02 MED ORDER — ADULT MULTIVITAMIN W/MINERALS CH
1.0000 | ORAL_TABLET | Freq: Every day | ORAL | Status: DC
  Administered 2017-07-03: 1 via ORAL
  Filled 2017-07-02 (×2): qty 1

## 2017-07-02 MED ORDER — ACETAMINOPHEN 325 MG PO TABS
650.0000 mg | ORAL_TABLET | Freq: Four times a day (QID) | ORAL | Status: DC | PRN
  Administered 2017-07-03 – 2017-07-06 (×2): 650 mg via ORAL
  Filled 2017-07-02 (×2): qty 2

## 2017-07-02 MED ORDER — LORAZEPAM 1 MG PO TABS
1.0000 mg | ORAL_TABLET | Freq: Four times a day (QID) | ORAL | Status: DC
  Administered 2017-07-02 – 2017-07-03 (×2): 1 mg via ORAL
  Filled 2017-07-02 (×2): qty 1

## 2017-07-02 MED ORDER — THIAMINE HCL 100 MG/ML IJ SOLN: 100.0000 mg | Freq: Once | INTRAMUSCULAR | Status: DC

## 2017-07-02 MED ORDER — HYDROXYZINE HCL 25 MG PO TABS
25.0000 mg | ORAL_TABLET | Freq: Four times a day (QID) | ORAL | Status: DC | PRN
  Administered 2017-07-02: 25 mg via ORAL
  Filled 2017-07-02: qty 1

## 2017-07-02 MED ORDER — LORAZEPAM 1 MG PO TABS: 1.0000 mg | ORAL_TABLET | Freq: Two times a day (BID) | ORAL | Status: DC

## 2017-07-02 MED ORDER — VITAMIN B-1 100 MG PO TABS
100.0000 mg | ORAL_TABLET | Freq: Every day | ORAL | Status: DC
  Administered 2017-07-03: 100 mg via ORAL
  Filled 2017-07-02 (×2): qty 1

## 2017-07-02 MED ORDER — LORAZEPAM 1 MG PO TABS: 1.0000 mg | ORAL_TABLET | Freq: Four times a day (QID) | ORAL | Status: DC | PRN

## 2017-07-02 MED ORDER — GABAPENTIN 300 MG PO CAPS
300.0000 mg | ORAL_CAPSULE | Freq: Three times a day (TID) | ORAL | Status: DC
  Administered 2017-07-03 (×2): 300 mg via ORAL
  Filled 2017-07-02 (×4): qty 1

## 2017-07-02 MED ORDER — LORAZEPAM 1 MG PO TABS: 1.0000 mg | ORAL_TABLET | Freq: Every day | ORAL | Status: DC

## 2017-07-02 MED ORDER — NICOTINE 21 MG/24HR TD PT24
21.0000 mg | MEDICATED_PATCH | Freq: Every day | TRANSDERMAL | Status: DC
  Administered 2017-07-03: 21 mg via TRANSDERMAL
  Filled 2017-07-02 (×3): qty 1

## 2017-07-02 NOTE — BH Assessment (Signed)
[facility] Assessment Progress Note  Per Thedore Mins, MD, this pt requires psychiatric hospitalization.  Malva Limes, RN, [facility] has assigned pt to [facility] Rm [id number]; they will be ready to receive pt at 18:30.  Pt presents under IVC initiated by EDP April Palumbo, MD, and IVC documents have been faxed to [facility].  Pt's nurse, Diane, has been notified, and agrees to call report to [phone].  Pt is to be transported via Patent [employer].   Doylene Canning, MA Triage Specialist [phone]

## 2017-07-02 NOTE — Progress Notes (Signed)
Justin May is a 20 year old male pt admitted on involuntary basis. On admission, Tysin reports that he is here due to on-going anxiety and depression. He denies any SI on admission and is able to contract for safety while in the hospital. Ellwood reports that he sees Dr. Clelia Croft who has been prescribing medications for him and spoke about how he has been prescribed zoloft and lorazempam recently and spoke about how he has been on medications for a few years now but nothing that has been successful in treating his anxiety. He reports that he was living with a roommate but states that he will probably go live with his mother after discharge. Abhijay denies any illegal drug usage. He also reports that he was at [facility] in the past for similar circumstances. Esco was oriented to the unit and safety maintained
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
## 2017-07-02 NOTE — ED Notes (Signed)
Patient denies SI,HI and AVH at this time. Plan of care discussed. Encouragement and support provided and safety maintain. Q 15 min safety checks remain in place and video monitoring. 
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
## 2017-07-02 NOTE — Telephone Encounter (Signed)
I think inpatient hospitalization is the safest option and likely the only way pt will be able to safely detox from the meds which is definitely what he needs at this moment. Outpt would be far to dangerous for him.  We will need to make sure patient has close outpatient follow-up with a good psychiatrist - I recommend the [facility] if pt isn't scheduled a timely psych appointment after his discharge.

## 2017-07-02 NOTE — Progress Notes (Signed)
07/02/17 1409:  LRT went to pt room to offer activities, pt was sleep.  Mong Neal, LRT/CTRS 

## 2017-07-02 NOTE — ED Notes (Signed)
Pt transported to [facility] by [facility] for continuation of specialized care. Pt left in no acute distress. Belongings signed for and given to [employer] officer. Pt left in no acute distress. 

## 2017-07-02 NOTE — ED Notes (Signed)
[facility] contacted for patient transport to [facility]. 

## 2017-07-02 NOTE — Telephone Encounter (Signed)
Left detailed message about appointment today with Dr. Clelia Croft.   Ok to leave message Per HIPPA

## 2017-07-03 DIAGNOSIS — R5383 Other fatigue: Secondary | ICD-10-CM

## 2017-07-03 DIAGNOSIS — F1910 Other psychoactive substance abuse, uncomplicated: Secondary | ICD-10-CM

## 2017-07-03 DIAGNOSIS — R450 Nervousness: Secondary | ICD-10-CM

## 2017-07-03 DIAGNOSIS — R109 Unspecified abdominal pain: Secondary | ICD-10-CM

## 2017-07-03 DIAGNOSIS — F419 Anxiety disorder, unspecified: Secondary | ICD-10-CM

## 2017-07-03 DIAGNOSIS — R5381 Other malaise: Secondary | ICD-10-CM

## 2017-07-03 DIAGNOSIS — F17210 Nicotine dependence, cigarettes, uncomplicated: Secondary | ICD-10-CM

## 2017-07-03 DIAGNOSIS — R451 Restlessness and agitation: Secondary | ICD-10-CM

## 2017-07-03 DIAGNOSIS — R110 Nausea: Secondary | ICD-10-CM

## 2017-07-03 DIAGNOSIS — G4700 Insomnia, unspecified: Secondary | ICD-10-CM

## 2017-07-03 DIAGNOSIS — F1020 Alcohol dependence, uncomplicated: Secondary | ICD-10-CM

## 2017-07-03 MED ORDER — VITAMIN B-1 100 MG PO TABS
100.0000 mg | ORAL_TABLET | Freq: Every day | ORAL | Status: DC
  Administered 2017-07-04 – 2017-07-06 (×3): 100 mg via ORAL
  Filled 2017-07-03 (×4): qty 1

## 2017-07-03 MED ORDER — CHLORDIAZEPOXIDE HCL 25 MG PO CAPS: 25.0000 mg | ORAL_CAPSULE | Freq: Four times a day (QID) | ORAL | Status: AC | PRN

## 2017-07-03 MED ORDER — BUSPIRONE HCL 10 MG PO TABS
10.0000 mg | ORAL_TABLET | Freq: Two times a day (BID) | ORAL | Status: DC
  Administered 2017-07-04 – 2017-07-06 (×5): 10 mg via ORAL
  Filled 2017-07-03 (×7): qty 1

## 2017-07-03 MED ORDER — ADULT MULTIVITAMIN W/MINERALS CH
1.0000 | ORAL_TABLET | Freq: Every day | ORAL | Status: DC
  Administered 2017-07-04 – 2017-07-06 (×3): 1 via ORAL
  Filled 2017-07-03 (×5): qty 1

## 2017-07-03 MED ORDER — BUSPIRONE HCL 10 MG PO TABS
10.0000 mg | ORAL_TABLET | Freq: Three times a day (TID) | ORAL | Status: DC
  Administered 2017-07-03: 10 mg via ORAL
  Filled 2017-07-03: qty 1
  Filled 2017-07-03: qty 2
  Filled 2017-07-03 (×2): qty 1

## 2017-07-03 MED ORDER — HYDROXYZINE HCL 25 MG PO TABS
25.0000 mg | ORAL_TABLET | Freq: Four times a day (QID) | ORAL | Status: AC | PRN
  Administered 2017-07-03 – 2017-07-06 (×4): 25 mg via ORAL
  Filled 2017-07-03 (×4): qty 1

## 2017-07-03 MED ORDER — CHLORDIAZEPOXIDE HCL 25 MG PO CAPS
25.0000 mg | ORAL_CAPSULE | Freq: Three times a day (TID) | ORAL | Status: AC
  Administered 2017-07-04 (×3): 25 mg via ORAL
  Filled 2017-07-03 (×3): qty 1

## 2017-07-03 MED ORDER — HYDROXYZINE HCL 25 MG PO TABS: 25.0000 mg | ORAL_TABLET | Freq: Four times a day (QID) | ORAL | Status: DC | PRN

## 2017-07-03 MED ORDER — MIRTAZAPINE 7.5 MG PO TABS
7.5000 mg | ORAL_TABLET | Freq: Every day | ORAL | Status: DC
  Filled 2017-07-03: qty 1

## 2017-07-03 MED ORDER — CHLORDIAZEPOXIDE HCL 25 MG PO CAPS
25.0000 mg | ORAL_CAPSULE | ORAL | Status: AC
  Administered 2017-07-05: 25 mg via ORAL
  Filled 2017-07-03: qty 1

## 2017-07-03 MED ORDER — POTASSIUM CHLORIDE CRYS ER 20 MEQ PO TBCR
20.0000 meq | EXTENDED_RELEASE_TABLET | Freq: Three times a day (TID) | ORAL | Status: AC
  Administered 2017-07-03 – 2017-07-06 (×9): 20 meq via ORAL
  Filled 2017-07-03 (×9): qty 1

## 2017-07-03 MED ORDER — CHLORDIAZEPOXIDE HCL 25 MG PO CAPS
25.0000 mg | ORAL_CAPSULE | Freq: Three times a day (TID) | ORAL | Status: DC
  Administered 2017-07-03: 25 mg via ORAL
  Filled 2017-07-03: qty 1

## 2017-07-03 MED ORDER — LOPERAMIDE HCL 2 MG PO CAPS: 2.0000 mg | ORAL_CAPSULE | ORAL | Status: AC | PRN

## 2017-07-03 MED ORDER — GABAPENTIN 100 MG PO CAPS
100.0000 mg | ORAL_CAPSULE | Freq: Three times a day (TID) | ORAL | Status: DC
  Administered 2017-07-03 – 2017-07-05 (×7): 100 mg via ORAL
  Filled 2017-07-03 (×11): qty 1

## 2017-07-03 MED ORDER — CHLORDIAZEPOXIDE HCL 25 MG PO CAPS
25.0000 mg | ORAL_CAPSULE | Freq: Every day | ORAL | Status: AC
  Administered 2017-07-06: 25 mg via ORAL
  Filled 2017-07-03: qty 1

## 2017-07-03 MED ORDER — NICOTINE POLACRILEX 2 MG MT GUM
2.0000 mg | CHEWING_GUM | OROMUCOSAL | Status: DC | PRN
  Administered 2017-07-03 – 2017-07-06 (×7): 2 mg via ORAL
  Filled 2017-07-03: qty 1

## 2017-07-03 MED ORDER — CHLORDIAZEPOXIDE HCL 25 MG PO CAPS
25.0000 mg | ORAL_CAPSULE | Freq: Four times a day (QID) | ORAL | Status: AC
  Administered 2017-07-03 (×2): 25 mg via ORAL
  Filled 2017-07-03 (×2): qty 1

## 2017-07-03 MED ORDER — ONDANSETRON 4 MG PO TBDP: 4.0000 mg | ORAL_TABLET | Freq: Four times a day (QID) | ORAL | Status: AC | PRN

## 2017-07-03 MED ORDER — ESCITALOPRAM OXALATE 5 MG PO TABS
5.0000 mg | ORAL_TABLET | Freq: Every day | ORAL | Status: DC
  Administered 2017-07-03 – 2017-07-05 (×3): 5 mg via ORAL
  Filled 2017-07-03 (×5): qty 1
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
## 2017-07-03 NOTE — [facility] Group Notes (Signed)
Adult Psychoeducational Group Note  Date:  07/03/2017 Time:  8:56 PM  Group Topic/Focus:  Goals Group:   The focus of this group is to help patients establish daily goals to achieve during treatment and discuss how the patient can incorporate goal setting into their daily lives to aide in recovery.  Participation Level:  Active  Participation Quality:  Appropriate  Affect:  Appropriate  Cognitive:  Alert  Insight: Appropriate  Engagement in Group:  Engaged  Modes of Intervention:  Discussion  Additional Comments:  Pt was alert and engaged in group. Pt stated that he had a good day today and he achieved his goal of coping with situation that's he's currently dealing with.  Dellia Nims 07/03/2017, 8:56 PM

## 2017-07-03 NOTE — Progress Notes (Signed)
D    Pt endorses anxiety and intrusive thoughts   He also complains of poor sleep because his thoughts keep him awake    He can be intrusive at times   He interacts with peers and has been visible on the milieu A    Verbal support given   Medications administered and effectiveness monitored    Q 15 min checks   Encouraged pt to discuss his intrusive thoughts and his difficulty falling asleep with his doctor R   Pt is safe at present time

## 2017-07-03 NOTE — Progress Notes (Signed)
Recreation Therapy Notes  Animal-Assisted Activity (AAA) Program Checklist/Progress Notes Patient Eligibility Criteria Checklist & Daily Group note for Rec TxIntervention  Date: 10.02.2018 Time: 11:25am Location: [address] Dayroom   AAA/T Program Assumption of Risk Form signed by Patient/ or Parent Legal Guardian Yes  Patient is free of allergies or sever asthma Yes  Patient reports no fear of animals Yes  Patient reports no history of cruelty to animals Yes  Patient understands his/her participation is voluntary Yes  Patient washes hands before animal contact Yes  Patient washes hands after animal contact Yes  Behavioral Response: Appropriate   Education:Hand Washing, Appropriate Animal Interaction   Education Outcome: Acknowledges education.   Clinical Observations/Feedback: Patient attended session and interacted appropriately with therapy dog and peers.   Nikeshia Keetch L Ponce Skillman, LRT/CTRS        Byran Bilotti L 07/03/2017 2:33 PM 

## 2017-07-03 NOTE — Telephone Encounter (Signed)
Lm see previous note 

## 2017-07-03 NOTE — Progress Notes (Signed)
Nursing Note 07/03/2017 [id number]  Data Reports sleeping fair with PRN sleep med.  Rates depression 2/10, hopelessness 3/10, and anxiety 6/10. Affect animated, appropriate.  Denies HI, SI, AVH. Spends free time in milieu, interacting well with staff and peers.  Action Spoke with patient 1:1, nurse offered support to patient throughout shift.  Continues to be monitored on 15 minute checks for safety.  Response Remains safe and appropriate on unit.

## 2017-07-03 NOTE — [facility] Suicide Risk Assessment (Addendum)
[facility] Admission Suicide Risk Assessment   Nursing information obtained from:   patient and chart  Demographic factors:   21 year old single male, no children,  lives with roommate, employed  Current Mental Status:   see below  Loss Factors:   alcohol dependence/ relapse  Historical Factors:   alcohol use disorder  Risk Reduction Factors:   resilience   Total Time spent with patient: 45 minutes Principal Problem: Diagnosis:   Patient Active Problem List   Diagnosis Date Noted  . Alcohol use with alcohol-induced mood disorder (HCC) [F10.94] 07/01/2017  . Adjustment disorder with mixed disturbance of emotions and conduct [F43.25] 05/01/2017  . Tobacco use disorder [F17.200] 04/14/2017  . 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 Alcohol abuse [F10.10] 04/14/2017  . Insomnia [G47.00] 04/14/2017  . Anxiety state [F41.1] 06/16/2016  . Social phobia [F40.10] 06/16/2016    Continued Clinical Symptoms:  Alcohol Use Disorder Identification Test Final Score (AUDIT): 17 The "Alcohol Use Disorders Identification Test", Guidelines for Use in Primary Care, Second Edition.  World Science writer [facility]). Score between 0-7:  no or low risk or alcohol related problems. Score between 8-15:  moderate risk of alcohol related problems. Score between 16-19:  high risk of alcohol related problems. Score 20 or above:  warrants further diagnostic evaluation for alcohol dependence and treatment.   CLINICAL FACTORS:  21 year old single male .Employed at [employer], lives with a roommate .   Reports recent seizure - 4 days ago ( denies prior history of seizures or recent head trauma) . Patient states he does not remember event and states it was witnessed by his friend, who stated " I looked like I was shaking and tensing up". Went to ED , states he was worked up with head MRI which was reported to him as normal, and was discharged. Patient suspects this seizure was related to " not  taking any Ativan for a week", and " having a lot of stress ".  States he had a second similar event a day later leading to going back to ED , but states " that was not a seizure, it was a black out from drinking and using Ativan". Admission BAL on 9/30 was 230, UDS was negative . In ED presented as combative, aggressive and was IVCd .  States he drinks " a few times a week", states he drinks 3 beers per episode.  He states he is prescribed Ativan for anxiety, which he had been taking at 2 mgrs TID . As noted, reports he had stopped it about a week ago. Denies drug abuse .  History of anxiety , which he characterizes mostly as social anxiety/phobia. No history of suicide attempts, denies history of self cutting, denies history of psychosis, denies history of mania. Denies history of severe depression. States a combination of Lexapro and Buspar in the past helped his anxiety . States Zoloft was not helpful. Had been prescribed Neurontin prior to admission but was not taking regularly.  Denies Medical Illnesses, other than recent seizure. NKDA.   Dx- BZD Dependence, Alcohol Abuse, Consider recent BZD withdrawal seizure , Social Phobia by history  Plan- Inpatient admission, Start Lexapro 5 mgrs QDAY, continue Buspar at 10 mgrs BID, Neurontin 100 mgrs TID for anxiety, titrate gradually as tolerated, Librium detox protocol to minimize symptoms of WDL and risk of WDL seizures .        Musculoskeletal: Strength & Muscle Tone: within normal limits- no current tremors or  diaphoresis  Gait & Station: normal Patient leans: N/A  Psychiatric Specialty Exam: Physical Exam  ROS denies headache, no visual disturbances, no chest pain, no shortness of breath, no vomiting, no diarrhea   Blood pressure 125/75, pulse (!) 58, temperature 97.8 F (36.6 C), temperature source Oral, resp. rate 20, height 5' 9.5" (1.765 m), weight 68.5 kg (151 lb).Body mass index is 21.98 kg/m.  General Appearance: Well  Groomed  Eye Contact:  Good  Speech:  Normal Rate  Volume:  Normal  Mood:  minimizes depression at this time  Affect:  anxious  Thought Process:  Linear and Descriptions of Associations: Intact  Orientation:  Full (Time, Place, and Person)  Thought Content:  denies hallucinations, no delusions, not internally preoccupied   Suicidal Thoughts:  No denies suicidal ideations , denies any self injurious ideations.  Homicidal Thoughts:  No  Memory:  recent and remote grossly intact   Judgement:  Fair  Insight:  Fair  Psychomotor Activity:  Normal- no current significant tremors or diaphoresis.   Concentration:  Concentration: Good and Attention Span: Good  Recall:  Good  Fund of Knowledge:  Good  Language:  Good  Akathisia:  No  Handed:  Right  AIMS (if indicated):     Assets:  Communication Skills Desire for Improvement Resilience  ADL's:  Intact  Cognition:  WNL  Sleep:  Number of Hours: 6.75      COGNITIVE FEATURES THAT CONTRIBUTE TO RISK:  Closed-mindedness and Loss of executive function    SUICIDE RISK:   Moderate:  Frequent suicidal ideation with limited intensity, and duration, some specificity in terms of plans, no associated intent, good self-control, limited dysphoria/symptomatology, some risk factors present, and identifiable protective factors, including available and accessible social support.  PLAN OF CARE: Admit patient to inpatient unit for the purpose of detoxification/ management of withdrawal. Provide support and encouragement regarding sobriety/abstinence efforts. Will follow daily.   I certify that inpatient services furnished can reasonably be expected to improve the patient's condition.   Craige Cotta, MD 07/03/2017, 8:39 AM

## 2017-07-03 NOTE — [facility] Group Notes (Addendum)
LCSW Group Therapy Note  07/03/2017 1:15pm  Type of Therapy/Topic:  Group Therapy:  Feelings about Diagnosis  Participation Level: Active  Description of Group:   This group will allow patients to explore their thoughts and feelings about diagnoses they have received. Patients will be guided to explore their level of understanding and acceptance of these diagnoses. Facilitator will encourage patients to process their thoughts and feelings about the reactions of others to their diagnosis and will guide patients in identifying ways to discuss their diagnosis with significant others in their lives. This group will be process-oriented, with patients participating in exploration of their own experiences, giving and receiving support, and processing challenge from other group members.   Therapeutic Goals: 1. Patient will demonstrate understanding of diagnosis as evidenced by identifying two or more symptoms of the disorder 2. Patient will be able to express two feelings regarding the diagnosis 3. Patient will demonstrate their ability to communicate their needs through discussion and/or role play  Summary of Patient Progress: Pt was present for the duration of his group. Pt states that he has felt unsure about sharing his mental health diagnosis with others because he is afraid how people would react. Pt states that he thinks he needs a couple more days in the hospital because he is still detoxing. Pt is looking forward to discharge but doesn't want to leave until he is ready.  Therapeutic Modalities:   Cognitive Behavioral Therapy Brief Therapy Feelings Identification    Justin May, MSW, LCSWA 07/03/2017 2:18 PM

## 2017-07-03 NOTE — H&P (Signed)
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  Diagnosis:   Patient Active Problem List   Diagnosis Date Noted  . Alcohol use with alcohol-induced mood disorder ([place]) [F10.94] 07/01/2017  . Adjustment disorder with mixed disturbance of emotions and conduct [F43.25] 05/01/2017  . Tobacco use disorder [F17.200] 04/14/2017  . Mild benzodiazepine use disorder ([place]) [F13.10] 04/14/2017  . Alcohol abuse [F10.10] 04/14/2017  . Insomnia [G47.00] 04/14/2017  . Anxiety state [F41.1] 06/16/2016  . Social phobia [F40.10] 06/16/2016   History of Present Illness: (Per SRA): 21 year old single male .Employed at [employer], lives with a roommate . Reports recent seizure - 4 days ago ( denies prior history of seizures or recent head trauma) . Patient states he does not remember event and states it was witnessed by his friend, who stated " I looked like I was shaking and tensing up". Went to ED , states he was worked up with head MRI which was reported to him as normal, and was discharged. Patient suspects this seizure was related to " not taking any Ativan for a week", and " having a lot of stress ".  States he had a second similar event a day later leading to going back to ED , but states " that was not a seizure, it was a black out from drinking and using Ativan". Admission BAL on 9/30 was 230, UDS was negative . In ED presented as combative, aggressive and was IVCd .  States he drinks " a few times a week", states he drinks 3 beers per episode.  He states he is prescribed Ativan for anxiety, which he had been taking at 2 mgrs TID . As noted, reports he had stopped it about a week ago. Denies drug abuse .  History of anxiety , which he characterizes mostly as social anxiety/phobia.  No history of suicide attempts, denies history of self cutting, denies history of psychosis, denies history of mania. Denies history of severe depression. States a combination of Lexapro and Buspar in the past helped his anxiety . States Zoloft was not helpful. Had been prescribed Neurontin prior to admission but was not taking regularly.  Associated Signs/Symptoms:  Depression Symptoms:  depressed mood, insomnia, psychomotor agitation, anxiety,  (Hypo) Manic Symptoms:  Impulsivity,  Anxiety Symptoms:  Excessive Worry,  Psychotic Symptoms:  Denies any hallucinations, delusiona or paranoia.  PTSD Symptoms: NA  Total Time spent with patient: 1 hour  Past Psychiatric History: Depression, anxiety  Is the patient at risk to self? No.  Has the patient been a risk to self in the past 6 months? No.  Has the patient been a risk to self within the distant past? No.  Is the patient a risk to others? No.  Has the patient been a risk to others in the past 6 months? No.  Has the patient been a risk to others within the distant past? No.   Prior Inpatient Therapy: Denies  Prior Outpatient Therapy: Denies.  Alcohol Screening: 1. How often do you have a drink containing alcohol?: 2 to 3 times a week 2. How many drinks containing alcohol do you have on a typical day when you are drinking?: 5 or 6 3. How often do you have six or more drinks on one occasion?: Weekly Preliminary Score: 5 4. How often during  the last year have you found that you were not able to stop drinking once you had started?: Less than monthly 5. How often during the last year have you failed to do what was normally expected from you becasue of drinking?: Never 6. How often during the last year have you needed a first drink in the morning to get yourself going after a heavy drinking session?: Less than monthly 7. How often during the last year have you had a feeling of guilt of remorse after drinking?: Weekly 8. How often  during the last year have you been unable to remember what happened the night before because you had been drinking?: Never 9. Have you or someone else been injured as a result of your drinking?: No 10. Has a relative or friend or a doctor or another health worker been concerned about your drinking or suggested you cut down?: Yes, during the last year Alcohol Use Disorder Identification Test Final Score (AUDIT): 17 Brief Intervention: Yes  Substance Abuse History in the last 12 months:  Yes.    Consequences of Substance Abuse: Medical Consequences:  Liver damage, Possible death by overdose Legal Consequences:  Arrests, jail time, Loss of driving privilege. Family Consequences:  Family discord, divorce and or separation.  Previous Psychotropic Medications: Yes, Xanax  Psychological Evaluations: No   Past Medical History:  Past Medical History:  Diagnosis Date  . Anxiety   . Asthma   . Seizures ([place])    History reviewed. No pertinent surgical history.  Family History:  Family History  Problem Relation Age of Onset  . Hypertension Father    Family Psychiatric  History: Generalized anxiety disorder: Mother.  Tobacco Screening: Have you used any form of tobacco in the last 30 days? (Cigarettes, Smokeless Tobacco, Cigars, and/or Pipes): Yes Tobacco use, Select all that apply: 5 or more cigarettes per day Are you interested in Tobacco Cessation Medications?: Yes, will notify MD for an order Counseled patient on smoking cessation including recognizing danger situations, developing coping skills and basic information about quitting provided: Refused/Declined practical counseling  Social History:  History  Alcohol Use  . 0.6 oz/week  . 1 Cans of beer per week    Comment: pt states he drinks 3-4 days out of the week     History  Drug Use No    Additional Social History: Marital status: Single Are you sexually active?: Yes What is your sexual orientation?: heterosexual Has your  sexual activity been affected by drugs, alcohol, medication, or emotional stress?: n/a  Does patient have children?: No   Allergies:  No Known Allergies  Lab Results: No results found for this or any previous visit (from the past 56 hour(s)).  Blood Alcohol level:  Lab Results  Component Value Date   ETH 260 (H) 07/01/2017   ETH <10 32/35/5732   Metabolic Disorder Labs:  No results found for: HGBA1C, MPG No results found for: PROLACTIN No results found for: CHOL, TRIG, HDL, CHOLHDL, VLDL, LDLCALC  Current Medications: Current Facility-Administered Medications  Medication Dose Route Frequency Provider Last Rate Last Dose  . acetaminophen (TYLENOL) tablet 650 mg  650 mg Oral Q6H PRN Ethelene Hal, NP      . alum & mag hydroxide-simeth (MAALOX/MYLANTA) 200-200-20 MG/5ML suspension 30 mL  30 mL Oral Q4H PRN Ethelene Hal, NP      . gabapentin (NEURONTIN) capsule 300 mg  300 mg Oral TID Ethelene Hal, NP   300 mg at 07/03/17 2025  .  hydrOXYzine (ATARAX/VISTARIL) tablet 25 mg  25 mg Oral Q6H PRN Laverle Hobby, PA-C   25 mg at 07/02/17 2209  . loperamide (IMODIUM) capsule 2-4 mg  2-4 mg Oral PRN Laverle Hobby, PA-C      . LORazepam (ATIVAN) tablet 1 mg  1 mg Oral Q6H PRN Laverle Hobby, PA-C      . LORazepam (ATIVAN) tablet 1 mg  1 mg Oral QID Patriciaann Clan E, PA-C   1 mg at 07/03/17 2671   Followed by  . [START ON 07/04/2017] LORazepam (ATIVAN) tablet 1 mg  1 mg Oral TID Laverle Hobby, PA-C       Followed by  . [START ON 07/05/2017] LORazepam (ATIVAN) tablet 1 mg  1 mg Oral BID Patriciaann Clan E, PA-C       Followed by  . [START ON 07/07/2017] LORazepam (ATIVAN) tablet 1 mg  1 mg Oral Daily Simon, Spencer E, PA-C      . magnesium hydroxide (MILK OF MAGNESIA) suspension 30 mL  30 mL Oral Daily PRN Ethelene Hal, NP      . multivitamin with minerals tablet 1 tablet  1 tablet Oral Daily Laverle Hobby, PA-C   1 tablet at 07/03/17 2458  . nicotine  (NICODERM CQ - dosed in mg/24 hours) patch 21 mg  21 mg Transdermal Daily , Myer Peer, MD   21 mg at 07/03/17 0823  . ondansetron (ZOFRAN-ODT) disintegrating tablet 4 mg  4 mg Oral Q6H PRN Patriciaann Clan E, PA-C      . potassium chloride SA (K-DUR,KLOR-CON) CR tablet 20 mEq  20 mEq Oral TID Lindell Spar I, NP      . thiamine (B-1) injection 100 mg  100 mg Intramuscular Once Patriciaann Clan E, PA-C      . thiamine (VITAMIN B-1) tablet 100 mg  100 mg Oral Daily Patriciaann Clan E, PA-C   100 mg at 07/03/17 0998  . traZODone (DESYREL) tablet 50 mg  50 mg Oral QHS Ethelene Hal, NP   50 mg at 07/02/17 2209   PTA Medications: Prescriptions Prior to Admission  Medication Sig Dispense Refill Last Dose  . doxepin (SINEQUAN) 50 MG capsule TAKE 1-2 CAPSULES (50-100 MG TOTAL) BY MOUTH AT BEDTIME AS NEEDED (INSOMNIA). 60 capsule 0 unk  . gabapentin (NEURONTIN) 400 MG capsule Take 1 capsule (400 mg total) by mouth 3 (three) times daily. 90 capsule 0 unk  . LORazepam (ATIVAN) 2 MG tablet Take 1 tablet (2 mg total) by mouth every 8 (eight) hours as needed for anxiety. 9 tablet 0 unk  . sertraline (ZOLOFT) 50 MG tablet Take 1 tablet (50 mg total) by mouth daily. 30 tablet 3 unk   Musculoskeletal: Strength & Muscle Tone: within normal limits Gait & Station: normal Patient leans: N/A  Psychiatric Specialty Exam: Physical Exam  Constitutional: He is oriented to person, place, and time. He appears well-developed.  HENT:  Head: Normocephalic.  Eyes: Pupils are equal, round, and reactive to light.  Neck: Normal range of motion.  Cardiovascular: Normal rate.   Respiratory: Effort normal.  GI: Soft.  Genitourinary:  Genitourinary Comments: Deferred  Musculoskeletal: Normal range of motion.  Neurological: He is alert and oriented to person, place, and time.  Skin: Skin is warm and dry.    Review of Systems  Constitutional: Positive for chills and malaise/fatigue.  Eyes: Negative.    Respiratory: Negative.   Cardiovascular: Negative.   Gastrointestinal: Positive for abdominal pain and nausea.  Genitourinary: Negative.   Musculoskeletal: Negative.   Skin: Negative.   Neurological: Negative.   Endo/Heme/Allergies: Negative.   Psychiatric/Behavioral: Positive for depression and substance abuse (Alcohol use disorder, BAL 260). Negative for hallucinations, memory loss and suicidal ideas. The patient is nervous/anxious and has insomnia.     Blood pressure 125/75, pulse (!) 58, temperature 97.8 F (36.6 C), temperature source Oral, resp. rate 20, height 5' 9.5" (1.765 m), weight 68.5 kg (151 lb).Body mass index is 21.98 kg/m.  General Appearance: Well Groomed  Eye Contact:  Good  Speech:  Normal Rate  Volume:  Normal  Mood:  minimizes depression at this time  Affect:  anxious  Thought Process:  Linear and Descriptions of Associations: Intact  Orientation:  Full (Time, Place, and Person)  Thought Content:  denies hallucinations, no delusions, not internally preoccupied   Suicidal Thoughts:  No denies suicidal ideations , denies any self injurious ideations.  Homicidal Thoughts:  No  Memory:  recent and remote grossly intact   Judgement:  Fair  Insight:  Fair  Psychomotor Activity:  Normal- no current significant tremors or diaphoresis.   Concentration:  Concentration: Good and Attention Span: Good  Recall:  Good  Fund of Knowledge:  Good  Language:  Good  Akathisia:  No  Handed:  Right  AIMS (if indicated):     Assets:  Communication Skills Desire for Improvement Resilience  ADL's:  Intact  Cognition:  WNL  Sleep:  Number of Hours: 6.75     Treatment Plan/Recommendations: 1. Admit for crisis management and stabilization, estimated length of stay 3-5 days.   2. Medication management to reduce current symptoms to base line and improve the patient's overall level of functioning: See MAR, Md's SRA & Treatment plan.  3. Treat health problems as indicated.  4.  Develop treatment plan to decrease risk of relapse upon discharge and the need for readmission.  5. Psycho-social education regarding relapse prevention and self care.  6. Health care follow up as needed for medical problems.  7. Review, reconcile, and reinstate any pertinent home medications for other health issues where appropriate. 8. Call for consults with hospitalist for any additional specialty patient care services as needed.  Observation Level/Precautions:  15 minute checks  Laboratory:  PER ED, BAL 260  Psychotherapy: Group sessions, AA meetings.  Medications: See MAR.    Consultations: As needed   Discharge Concerns: Maintaining sobriety.    Estimated LOS: 2-4 days  Other: Admit to the [facility].    Physician Treatment Plan for Primary Diagnosis: Will initiate medication management for mood stability. Set up an outpatient psychiatric services for medication management. Will encourage medication adherence with psychiatric medications.  Long Term Goal(s): Improvement in symptoms so as ready for discharge  Short Term Goals: Ability to identify changes in lifestyle to reduce recurrence of condition will improve and Ability to demonstrate self-control will improve  Physician Treatment Plan for Secondary Diagnosis: Active Problems:   Alcohol use with alcohol-induced mood disorder ([place])  Long Term Goal(s): Improvement in symptoms so as ready for discharge  Short Term Goals: Ability to identify and develop effective coping behaviors will improve and Ability to identify triggers associated with substance abuse/mental health issues will improve  I certify that inpatient services furnished can reasonably be expected to improve the patient's condition.    Encarnacion Slates, NP, PMHNP, FNP-BC. 10/2/20189:54 AM   I have discussed case with NP and have met with patient  Agree with NP assessment  4 year old  single male .Employed at [employer], lives with a roommate .   Reports recent  seizure - 4 days ago ( denies prior history of seizures or recent head trauma) . Patient states he does not remember event and states it was witnessed by his friend, who stated " I looked like I was shaking and tensing up". Went to ED , states he was worked up with head MRI which was reported to him as normal, and was discharged. Patient suspects this seizure was related to " not taking any Ativan for a week", and " having a lot of stress ".  States he had a second similar event a day later leading to going back to ED , but states " that was not a seizure, it was a black out from drinking and using Ativan". Admission BAL on 9/30 was 230, UDS was negative . In ED presented as combative, aggressive and was IVCd .  States he drinks " a few times a week", states he drinks 3 beers per episode.  He states he is prescribed Ativan for anxiety, which he had been taking at 2 mgrs TID . As noted, reports he had stopped it about a week ago. Denies drug abuse .  History of anxiety , which he characterizes mostly as social anxiety/phobia. No history of suicide attempts, denies history of self cutting, denies history of psychosis, denies history of mania. Denies history of severe depression. States a combination of Lexapro and Buspar in the past helped his anxiety . States Zoloft was not helpful. Had been prescribed Neurontin prior to admission but was not taking regularly.  Denies Medical Illnesses, other than recent seizure. NKDA.   Dx- BZD Dependence, Alcohol Abuse, Consider recent BZD withdrawal seizure , Social Phobia by history  Plan- Inpatient admission, Start Lexapro 5 mgrs QDAY, continue Buspar at 10 mgrs BID, Neurontin 100 mgrs TID for anxiety, titrate gradually as tolerated, Librium detox protocol to minimize symptoms of WDL and risk of WDL seizures .

## 2017-07-03 NOTE — [facility] Suicide Risk Assessment (Signed)
[facility] INPATIENT:  Family/Significant Other Suicide Prevention Education  Suicide Prevention Education:  Education Completed; Justin May (pt's mother) [phone] has been identified by the patient as the family member/significant other with whom the patient will be residing, and identified as the person(s) who will aid the patient in the event of a mental health crisis (suicidal ideations/suicide attempt).  With written consent from the patient, the family member/significant other has been provided the following suicide prevention education, prior to the and/or following the discharge of the patient.  The suicide prevention education provided includes the following:  Suicide risk factors  Suicide prevention and interventions  National Suicide Hotline telephone number  [facility] assessment telephone number  [facility] Emergency Assistance 911  [facility] [address] and/or Residential Mobile Crisis Unit telephone number  Request made of family/significant other to:  Remove weapons (e.g., guns, rifles, knives), all items previously/currently identified as safety concern.    Remove drugs/medications (over-the-counter, prescriptions, illicit drugs), all items previously/currently identified as a safety concern.  The family member/significant other verbalizes understanding of the suicide prevention education information provided.  The family member/significant other agrees to remove the items of safety concern listed above.  Kailynn Satterly N Smart LCSW 07/03/2017, 11:18 AM

## 2017-07-04 ENCOUNTER — Ambulatory Visit: Payer: Commercial Managed Care - PPO | Admitting: Family Medicine

## 2017-07-04 MED ORDER — TRAZODONE HCL 50 MG PO TABS
50.0000 mg | ORAL_TABLET | Freq: Every day | ORAL | Status: DC
  Administered 2017-07-04 – 2017-07-05 (×2): 50 mg via ORAL
  Filled 2017-07-04 (×4): qty 1

## 2017-07-04 NOTE — Plan of Care (Signed)
Problem: Education: Goal: Utilization of techniques to improve thought processes will improve Outcome: Progressing Nurse discussed depression/anxiety/coping skills with patient.    

## 2017-07-04 NOTE — Tx Team (Signed)
Interdisciplinary Treatment and Diagnostic Plan Update  07/04/2017 Time of Session: 0830AM Justin May MRN: [id number]  Principal Diagnosis: Alcohol Induced Mood Disorder  Secondary Diagnoses: Active Problems:   Alcohol use with alcohol-induced mood disorder (HCC)   Alcohol use disorder, moderate, dependence (HCC)   Current Medications:  Current Facility-Administered Medications  Medication Dose Route Frequency Provider Last Rate Last Dose  . acetaminophen (TYLENOL) tablet 650 mg  650 mg Oral Q6H PRN Laveda Abbe, NP   650 mg at 07/03/17 1200  . alum & mag hydroxide-simeth (MAALOX/MYLANTA) 200-200-20 MG/5ML suspension 30 mL  30 mL Oral Q4H PRN Laveda Abbe, NP      . busPIRone (BUSPAR) tablet 10 mg  10 mg Oral BID Cobos, Rockey Situ, MD   10 mg at 07/04/17 0742  . chlordiazePOXIDE (LIBRIUM) capsule 25 mg  25 mg Oral Q6H PRN Cobos, Fernando A, MD      . chlordiazePOXIDE (LIBRIUM) capsule 25 mg  25 mg Oral TID Cobos, Rockey Situ, MD   25 mg at 07/04/17 0745   Followed by  . [START ON 07/05/2017] chlordiazePOXIDE (LIBRIUM) capsule 25 mg  25 mg Oral BH-qamhs Cobos, Rockey Situ, MD       Followed by  . [START ON 07/06/2017] chlordiazePOXIDE (LIBRIUM) capsule 25 mg  25 mg Oral Daily Cobos, Fernando A, MD      . escitalopram (LEXAPRO) tablet 5 mg  5 mg Oral Daily Cobos, Rockey Situ, MD   5 mg at 07/04/17 0743  . gabapentin (NEURONTIN) capsule 100 mg  100 mg Oral TID Cobos, Rockey Situ, MD   100 mg at 07/04/17 0743  . hydrOXYzine (ATARAX/VISTARIL) tablet 25 mg  25 mg Oral Q6H PRN Cobos, Rockey Situ, MD   25 mg at 07/03/17 2114  . loperamide (IMODIUM) capsule 2-4 mg  2-4 mg Oral PRN Cobos, Rockey Situ, MD      . magnesium hydroxide (MILK OF MAGNESIA) suspension 30 mL  30 mL Oral Daily PRN Laveda Abbe, NP      . multivitamin with minerals tablet 1 tablet  1 tablet Oral Daily Cobos, Rockey Situ, MD   1 tablet at 07/04/17 0742  . nicotine polacrilex (NICORETTE) gum 2 mg  2 mg  Oral PRN Cobos, Rockey Situ, MD   2 mg at 07/04/17 0746  . ondansetron (ZOFRAN-ODT) disintegrating tablet 4 mg  4 mg Oral Q6H PRN Cobos, Fernando A, MD      . potassium chloride SA (K-DUR,KLOR-CON) CR tablet 20 mEq  20 mEq Oral TID Armandina Stammer I, NP   20 mEq at 07/04/17 0744  . thiamine (VITAMIN B-1) tablet 100 mg  100 mg Oral Daily Cobos, Rockey Situ, MD   100 mg at 07/04/17 4098   PTA Medications: Prescriptions Prior to Admission  Medication Sig Dispense Refill Last Dose  . doxepin (SINEQUAN) 50 MG capsule TAKE 1-2 CAPSULES (50-100 MG TOTAL) BY MOUTH AT BEDTIME AS NEEDED (INSOMNIA). 60 capsule 0 unk  . gabapentin (NEURONTIN) 400 MG capsule Take 1 capsule (400 mg total) by mouth 3 (three) times daily. 90 capsule 0 unk  . LORazepam (ATIVAN) 2 MG tablet Take 1 tablet (2 mg total) by mouth every 8 (eight) hours as needed for anxiety. 9 tablet 0 unk  . sertraline (ZOLOFT) 50 MG tablet Take 1 tablet (50 mg total) by mouth daily. 30 tablet 3 unk    Patient Stressors: Substance abuse Other: history of abuse  Patient Strengths: Ability for insight Active sense of humor  Average or above average intelligence Capable of independent living General fund of knowledge Motivation for treatment/growth  Treatment Modalities: Medication Management, Group therapy, Case management,  1 to 1 session with clinician, Psychoeducation, Recreational therapy.   Physician Treatment Plan for Primary Diagnosis: Alcohol Induced Mood Disorder Long Term Goal(s): Improvement in symptoms so as ready for discharge Improvement in symptoms so as ready for discharge   Short Term Goals: Ability to identify changes in lifestyle to reduce recurrence of condition will improve Ability to demonstrate self-control will improve Ability to identify and develop effective coping behaviors will improve Ability to identify triggers associated with substance abuse/mental health issues will improve  Medication Management: Evaluate  patient's response, side effects, and tolerance of medication regimen.  Therapeutic Interventions: 1 to 1 sessions, Unit Group sessions and Medication administration.  Evaluation of Outcomes: Progressing  Physician Treatment Plan for Secondary Diagnosis: Active Problems:   Alcohol use with alcohol-induced mood disorder (HCC)   Alcohol use disorder, moderate, dependence (HCC)  Long Term Goal(s): Improvement in symptoms so as ready for discharge Improvement in symptoms so as ready for discharge   Short Term Goals: Ability to identify changes in lifestyle to reduce recurrence of condition will improve Ability to demonstrate self-control will improve Ability to identify and develop effective coping behaviors will improve Ability to identify triggers associated with substance abuse/mental health issues will improve     Medication Management: Evaluate patient's response, side effects, and tolerance of medication regimen.  Therapeutic Interventions: 1 to 1 sessions, Unit Group sessions and Medication administration.  Evaluation of Outcomes: Progressing   RN Treatment Plan for Primary Diagnosis: Alcohol Induced Mood Disorder Long Term Goal(s): Knowledge of disease and therapeutic regimen to maintain health will improve  Short Term Goals: Ability to demonstrate self-control, Ability to participate in decision making will improve and Ability to disclose and discuss suicidal ideas  Medication Management: RN will administer medications as ordered by provider, will assess and evaluate patient's response and provide education to patient for prescribed medication. RN will report any adverse and/or side effects to prescribing provider.  Therapeutic Interventions: 1 on 1 counseling sessions, Psychoeducation, Medication administration, Evaluate responses to treatment, Monitor vital signs and CBGs as ordered, Perform/monitor CIWA, COWS, AIMS and Fall Risk screenings as ordered, Perform wound care  treatments as ordered.  Evaluation of Outcomes: Progressing   LCSW Treatment Plan for Primary Diagnosis: Alcohol Induced Mood Disorder Long Term Goal(s): Safe transition to appropriate next level of care at discharge, Engage patient in therapeutic group addressing interpersonal concerns.  Short Term Goals: Engage patient in aftercare planning with referrals and resources, Facilitate patient progression through stages of change regarding substance use diagnoses and concerns and Identify triggers associated with mental health/substance abuse issues  Therapeutic Interventions: Assess for all discharge needs, 1 to 1 time with Social worker, Explore available resources and support systems, Assess for adequacy in community support network, Educate family and significant other(s) on suicide prevention, Complete Psychosocial Assessment, Interpersonal group therapy.  Evaluation of Outcomes: Progressing   Progress in Treatment: Attending groups: Yes. Participating in groups: Yes. Taking medication as prescribed: Yes. Toleration medication: Yes. Family/Significant other contact made: Yes, individual(s) contacted:  pt's mother Patient understands diagnosis: Yes. Discussing patient identified problems/goals with staff: Yes. Medical problems stabilized or resolved: Yes. Denies suicidal/homicidal ideation: Yes. Issues/concerns per patient self-inventory: No. Other: n/a   New problem(s) identified: No, Describe:  n/a  New Short Term/Long Term Goal(s): medication management for detox/mood stabilization, development of comprehensive mental wellness/sobriety plan.  Patient Goal: "To get detoxed and get help for my anxiety."   Discharge Plan or Barriers: CSW assessing. Pt plans to live with his mother at discharge and has follow-up with [facility] for medication management and therapy referral.   Reason for Continuation of Hospitalization: Anxiety Depression Medication stabilization Suicidal  ideation Withdrawal symptoms  Estimated Length of Stay: Friday, 07/06/17  Attendees: Patient: 07/04/2017 9:00 AM  Physician: Dr. Jama Flavors MD 07/04/2017 9:00 AM  Nursing: Meriam Sprague RN; Patrice RN 07/04/2017 9:00 AM  RN Care Manager: Onnie Boer CM 07/04/2017 9:00 AM  Social Worker: Chartered loss adjuster, LCSW 07/04/2017 9:00 AM  Recreational Therapist: x 07/04/2017 9:00 AM  Other: Hillery Jacks NP; Reola Calkins NP; Armandina Stammer NP 07/04/2017 9:00 AM  Other:  07/04/2017 9:00 AM  Other: 07/04/2017 9:00 AM    Scribe for Treatment Team: Ledell Peoples Smart, LCSW 07/04/2017 9:00 AM

## 2017-07-04 NOTE — Progress Notes (Addendum)
D:  Patient's self inventory sheet, patient has poor sleep, no sleep medication given.  Good appetite, normal energy level, poor concentration.  Rated depression and hopeless 1, anxiety 4.  Withdrawals, but did not check any withdrawals.  Physical problems, tired from lack of sleep.  Denied physical pain.  Does not take pain medications.  Goal is to go to all sessions today and come up with better coping mechanisms to deal with anxiety.  Plans to participate in all sessions.  Does have discharge plans. A:  Medications administered per MD orders.  Emotional support and encouragement given patient. R:  Denied SI and HI, contracts for safety.  Denied A/V hallucinations.  Safety maintained with 15 minute checks. Patient stated he wants to stay a few days.   Social anxiety.

## 2017-07-04 NOTE — [facility] Group Notes (Signed)
LCSW Group Therapy Note   07/04/2017 1:15pm   Type of Therapy and Topic:  Group Therapy:  Overcoming Obstacles   Participation Level:  Did Not Attend-pt invited. Chose to remain in bed.    Description of Group:    In this group patients will be encouraged to explore what they see as obstacles to their own wellness and recovery. They will be guided to discuss their thoughts, feelings, and behaviors related to these obstacles. The group will process together ways to cope with barriers, with attention given to specific choices patients can make. Each patient will be challenged to identify changes they are motivated to make in order to overcome their obstacles. This group will be process-oriented, with patients participating in exploration of their own experiences as well as giving and receiving support and challenge from other group members.   Therapeutic Goals: 1. Patient will identify personal and current obstacles as they relate to admission. 2. Patient will identify barriers that currently interfere with their wellness or overcoming obstacles.  3. Patient will identify feelings, thought process and behaviors related to these barriers. 4. Patient will identify two changes they are willing to make to overcome these obstacles:      Summary of Patient Progress      Therapeutic Modalities:   Cognitive Behavioral Therapy Solution Focused Therapy Motivational Interviewing Relapse Prevention Therapy  Ledell Peoples Smart, LCSW 07/04/2017 2:05 PM

## 2017-07-04 NOTE — Progress Notes (Addendum)
Patient ID: ZAHIR EISENHOUR, male   DOB: 1995-12-28, 21 y.o.   MRN: [id number]  Pt currently presents with a flat affect and anxious behavior. Pt reports to writer that their goal is to "get off of Ativan and go home soon." Pt reports good sleep with current medication regimen. Endorses lessening signs and symptoms of withdrawal and cravings.   Pt provided with medications per providers orders. Pt's labs and vitals were monitored throughout the night. Pt given a 1:1 about emotional and mental status. Pt supported and encouraged to express concerns and questions. Pt educated on medications.  Pt's safety ensured with 15 minute and environmental checks. Pt currently denies SI/HI and A/V hallucinations. Pt verbally agrees to seek staff if SI/HI or A/VH occurs and to consult with staff before acting on any harmful thoughts. Will continue POC.

## 2017-07-04 NOTE — Progress Notes (Signed)
Recreation Therapy Notes  Date: 07/04/17 Time: 0930 Location: [address] Dayroom  Group Topic: Stress Management  Goal Area(s) Addresses:  Patient will verbalize importance of using healthy stress management.  Patient will identify positive emotions associated with healthy stress management.   Behavioral Response: Engaged  Intervention: Stress Management  Activity :  Meditation.  LRT introduced the stress management technique of meditation.  LRT played a meditation from the Calm app that allowed patients the opportunity to take inventory of any feelings or sensations they were feeling throughout their bodies.  Patients were to listen and follow along as the meditation played.  Education:  Stress Management, Discharge Planning.   Education Outcome: Acknowledges edcuation/In group clarification offered/Needs additional education  Clinical Observations/Feedback: Pt attended group.   Caroll Rancher, LRT/CTRS        Caroll Rancher A 07/04/2017 11:26 AM

## 2017-07-04 NOTE — Progress Notes (Signed)
Patient attended NA group meeting. 

## 2017-07-04 NOTE — Progress Notes (Signed)
[facility] MD Progress Note  07/04/2017 1:28 PM Justin May  MRN:  [id number]  Subjective: Justin May reports, "It is going well today. The only problems I had last night was, I could not sleep. The Lexapro made me feel jittery. I needed  something to help me sleep. I asked for Melatonin, but was told you don't have it here. I would like to go back to Trazodone. I need it".  Objective: Justin May is seen, chart reviewed. His case is dicussed with the treatment team. He is alert & oriented x 4. He is visible on the unit, participating in the group sessions. He rates his depression at #1 & anxiety at #3 today. However, he says he did not sleep well last night. Has requested to be put back on Trazodone. He is currently tolerating his medications. Says the Lexapro makes him feel jittery, however, willing to continue on it until his body gets adjusted to it. Says, the withdrawal symptoms are tolerable at this time. He currently denies any new issues. He does not appear to be responding to any internal stimuli. Staff continues to support & provide encouragement.  Principal Problem: Alcohol use disorder, moderate, dependence, Alcohol induced mood disorder.  Diagnosis:   Patient Active Problem List   Diagnosis Date Noted  . Alcohol use disorder, moderate, dependence (HCC) [F10.20]   . Alcohol use with alcohol-induced mood disorder (HCC) [F10.94] 07/01/2017  . Adjustment disorder with mixed disturbance of emotions and conduct [F43.25] 05/01/2017  . Tobacco use disorder [F17.200] 04/14/2017  . 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 Alcohol abuse [F10.10] 04/14/2017  . Insomnia [G47.00] 04/14/2017  . Anxiety state [F41.1] 06/16/2016  . Social phobia [F40.10] 06/16/2016   Total Time spent with patient: 25 minutes  Past Psychiatric History: Alcoholism, chronic, Anxiety disorder.  Past Medical History:  Past Medical History:  Diagnosis Date  . Anxiety   . Asthma   . Seizures (HCC)    History reviewed. No pertinent surgical history.  Family History:  Family History  Problem Relation Age of Onset  . Hypertension Father    Family Psychiatric  History: See H&P  Social History:  History  Alcohol Use  . 0.6 oz/week  . 1 Cans of beer per week    Comment: pt states he drinks 3-4 days out of the week     History  Drug Use No    Social History   Social History  . Marital status: Single    Spouse name: N/A  . Number of children: N/A  . Years of education: N/A   Social History Main Topics  . Smoking status: Current Every Day Smoker    Packs/day: 1.00    Types: Cigarettes  . Smokeless tobacco: Never Used  . Alcohol use 0.6 oz/week    1 Cans of beer per week     Comment: pt states he drinks 3-4 days out of the week  . Drug use: No  . Sexual activity: Not Currently   Other Topics Concern  . None   Social History Narrative  . None   Additional Social History:   Sleep: Fair  Appetite:  Good  Current Medications: Current Facility-Administered Medications  Medication Dose Route Frequency Provider Last Rate Last Dose  . acetaminophen (TYLENOL) tablet 650 mg  650 mg Oral Q6H PRN Laveda Abbe, NP   650 mg at 07/03/17 1200  . alum & mag hydroxide-simeth (MAALOX/MYLANTA) 200-200-20 MG/5ML suspension 30 mL  30 mL Oral Q4H PRN Arville Care,  Dorise Hiss, NP      . busPIRone (BUSPAR) tablet 10 mg  10 mg Oral BID Jakylah Bassinger, Rockey Situ, MD   10 mg at 07/04/17 0742  . chlordiazePOXIDE (LIBRIUM) capsule 25 mg  25 mg Oral Q6H PRN Aydien Majette A, MD      . chlordiazePOXIDE (LIBRIUM) capsule 25 mg  25 mg Oral TID Erikson Danzy, Rockey Situ, MD   25 mg at 07/04/17 1251   Followed by  . [START ON 07/05/2017] chlordiazePOXIDE (LIBRIUM) capsule 25 mg  25 mg Oral BH-qamhs Brienna Bass, Rockey Situ, MD       Followed by  . [START ON 07/06/2017] chlordiazePOXIDE (LIBRIUM) capsule 25 mg  25 mg Oral Daily Allina Riches A, MD      . escitalopram (LEXAPRO) tablet 5 mg  5 mg Oral Daily Tavone Caesar,  Rockey Situ, MD   5 mg at 07/04/17 0743  . gabapentin (NEURONTIN) capsule 100 mg  100 mg Oral TID Theta Leaf, Rockey Situ, MD   100 mg at 07/04/17 1251  . hydrOXYzine (ATARAX/VISTARIL) tablet 25 mg  25 mg Oral Q6H PRN Kobie Matkins, Rockey Situ, MD   25 mg at 07/03/17 2114  . loperamide (IMODIUM) capsule 2-4 mg  2-4 mg Oral PRN Kameko Hukill, Rockey Situ, MD      . magnesium hydroxide (MILK OF MAGNESIA) suspension 30 mL  30 mL Oral Daily PRN Laveda Abbe, NP      . multivitamin with minerals tablet 1 tablet  1 tablet Oral Daily Gerren Hoffmeier, Rockey Situ, MD   1 tablet at 07/04/17 0742  . nicotine polacrilex (NICORETTE) gum 2 mg  2 mg Oral PRN Demarko Zeimet, Rockey Situ, MD   2 mg at 07/04/17 0746  . ondansetron (ZOFRAN-ODT) disintegrating tablet 4 mg  4 mg Oral Q6H PRN Kyrstin Campillo A, MD      . potassium chloride SA (K-DUR,KLOR-CON) CR tablet 20 mEq  20 mEq Oral TID Armandina Stammer I, NP   20 mEq at 07/04/17 1251  . thiamine (VITAMIN B-1) tablet 100 mg  100 mg Oral Daily Quinlin Conant, Rockey Situ, MD   100 mg at 07/04/17 0742  . traZODone (DESYREL) tablet 50 mg  50 mg Oral QHS Nwoko, Agnes I, NP       Lab Results: No results found for this or any previous visit (from the past 48 hour(s)).  Blood Alcohol level:  Lab Results  Component Value Date   ETH 260 (H) 07/01/2017   ETH <10 06/29/2017   Metabolic Disorder Labs: No results found for: HGBA1C, MPG No results found for: PROLACTIN No results found for: CHOL, TRIG, HDL, CHOLHDL, VLDL, LDLCALC  Physical Findings: AIMS: Facial and Oral Movements Muscles of Facial Expression: None, normal Lips and Perioral Area: None, normal Jaw: None, normal Tongue: None, normal,Extremity Movements Upper (arms, wrists, hands, fingers): None, normal Lower (legs, knees, ankles, toes): None, normal, Trunk Movements Neck, shoulders, hips: None, normal, Overall Severity Severity of abnormal movements (highest score from questions above): None, normal Incapacitation due to abnormal movements:  None, normal Patient's awareness of abnormal movements (rate only patient's report): No Awareness, Dental Status Current problems with teeth and/or dentures?: No Does patient usually wear dentures?: No  CIWA:  CIWA-Ar Total: 1 COWS:     Musculoskeletal: Strength & Muscle Tone: within normal limits Gait & Station: normal Patient leans: N/A  Psychiatric Specialty Exam: Physical Exam  Nursing note and vitals reviewed.   Review of Systems  All other systems reviewed and are negative.   Blood pressure 127/84,  pulse (!) 46, temperature 97.6 F (36.4 C), temperature source Oral, resp. rate 16, height 5' 9.5" (1.765 m), weight 68.5 kg (151 lb).Body mass index is 21.98 kg/m.  General Appearance: Well Groomed  Eye Contact:  Good  Speech:  Normal Rate  Volume:  Normal  Mood:  minimizes depression at this time  Affect:  anxious  Thought Process:  Linear and Descriptions of Associations: Intact  Orientation:  Full (Time, Place, and Person)  Thought Content:  denies hallucinations, no delusions, not internally preoccupied   Suicidal Thoughts:  No denies suicidal ideations , denies any self injurious ideations.  Homicidal Thoughts:  No  Memory:  recent and remote grossly intact   Judgement:  Fair  Insight:  Fair  Psychomotor Activity:  Normal- no current significant tremors or diaphoresis.   Concentration:  Concentration: Good and Attention Span: Good  Recall:  Good  Fund of Knowledge:  Good  Language:  Good  Akathisia:  No  Handed:  Right  AIMS (if indicated):     Assets:  Communication Skills Desire for Improvement Resilience  ADL's:  Intact  Cognition:  WNL  Sleep:  Number of Hours: 6.75     Treatment Plan Summary: Daily contact with patient to assess and evaluate symptoms and progress in treatment.  Will continue today 07/04/17 plan as below except where it is noted.  -Continue the Librium detox protocols for alcohol withdrawal symptoms. -Continue Buspar 10 mg bid for  anxiety. -Continue Lexapro 5 mg daily for depression/anxiety. -Continue Gabapentin 100 mg tid for agitation. -Re-initiate Trazodone 50 mg Q 2100 PM for insomnia.  - Continue 15 minutes observation for safety concerns - Encouraged to participate in milieu therapy and group therapy counseling sessions and also work with coping skills -  Develop treatment plan to decrease risk of relapse upon discharge and to reduce the need for readmission. -  Psycho-social education regarding relapse prevention and self care. - Health care follow up as needed for medical problems. - Restart home medications where appropriate.  Sanjuana Kava, NP, PMHNP, FNP-BC. 07/04/2017, 1:28 PM   Agree with NP Progress Note

## 2017-07-05 DIAGNOSIS — F4010 Social phobia, unspecified: Secondary | ICD-10-CM

## 2017-07-05 MED ORDER — PNEUMOCOCCAL VAC POLYVALENT 25 MCG/0.5ML IJ INJ
0.5000 mL | INJECTION | INTRAMUSCULAR | Status: AC
  Administered 2017-07-05: 0.5 mL via INTRAMUSCULAR

## 2017-07-05 MED ORDER — GABAPENTIN 100 MG PO CAPS
200.0000 mg | ORAL_CAPSULE | Freq: Three times a day (TID) | ORAL | Status: DC
  Administered 2017-07-06 (×2): 200 mg via ORAL
  Filled 2017-07-05 (×4): qty 2

## 2017-07-05 MED ORDER — ESCITALOPRAM OXALATE 10 MG PO TABS
10.0000 mg | ORAL_TABLET | Freq: Every day | ORAL | Status: DC
  Administered 2017-07-06: 10 mg via ORAL
  Filled 2017-07-05 (×2): qty 1

## 2017-07-05 NOTE — Progress Notes (Signed)
Patient ID: Justin May, male   DOB: [birth year], 21 y.o.   MRN: [id number]  Pt currently presents with a flat affect and cooperative behavior. Pt reports to writer that their goal is to "go home tomorrow." Pt reports good sleep with current medication regimen.   Pt provided with medications per providers orders. Pt's labs and vitals were monitored throughout the night. Pt given a 1:1 about emotional and mental status. Pt supported and encouraged to express concerns and questions. Pt educated on medications. EKG completed, provider Nira Conn, NP notified of results.   Pt's safety ensured with 15 minute and environmental checks. Pt currently denies SI/HI and A/V hallucinations. Pt verbally agrees to seek staff if SI/HI or A/VH occurs and to consult with staff before acting on any harmful thoughts. Will continue POC.

## 2017-07-05 NOTE — Progress Notes (Addendum)
[facility] MD Progress Note  07/05/2017 6:14 PM Justin May  MRN:  [id number]  Subjective:  Patient reports he is doing well, and currently denies significant depression. Also reports improving anxiety . Denies suicidal ideations . Denies withdrawal symptoms , and does not appear to be in any acute distress at this time .   Objective: I have discussed case with treatment team and have met with patient. Patient is 21 year old single male, employed. History of alcohol and benzodiazepine dependencies, history of recent isolated seizure , which he feels was related to BZD withdrawal. At this time patient presents with improved mood, affect is reactive, and denies any suicidal ideations. As he improves he is more future oriented, and currently more focused on disposition planning. States he plans to attend an outpatient treatment center [facility]) for ongoing treatment, and will live with parent . No current withdrawal symptoms- no tremors, no diaphoresis, no restlessness or agitation, denies visual disturbances or headache, vitals stable . Of note, presents with bradycardia- states he thinks this is related to being a runner, jogger, and being in good physical conditioning, denies any dizziness , lightheadedness or associated symptoms. Denies medication side effects. Patient reports history of social anxiety, but states he is feeling less anxious at this time, has been visible in day room and attending groups .   Principal Problem: Alcohol use disorder, moderate, dependence, Alcohol induced mood disorder.  Diagnosis:   Patient Active Problem List   Diagnosis Date Noted  . Alcohol use disorder, moderate, dependence ([place]) [F10.20]   . Alcohol use with alcohol-induced mood disorder ([place]) [F10.94] 07/01/2017  . Adjustment disorder with mixed disturbance of emotions and conduct [F43.25] 05/01/2017  . Tobacco use disorder [F17.200] 04/14/2017  . Mild benzodiazepine use disorder ([place]) [F13.10]  04/14/2017  . Alcohol abuse [F10.10] 04/14/2017  . Insomnia [G47.00] 04/14/2017  . Anxiety state [F41.1] 06/16/2016  . Social phobia [F40.10] 06/16/2016   Total Time spent with patient: 20 minutes  Past Psychiatric History: Alcoholism, chronic, Anxiety disorder.  Past Medical History:  Past Medical History:  Diagnosis Date  . Anxiety   . Asthma   . Seizures ([place])    History reviewed. No pertinent surgical history.  Family History:  Family History  Problem Relation Age of Onset  . Hypertension Father    Family Psychiatric  History: See H&P  Social History:  History  Alcohol Use  . 0.6 oz/week  . 1 Cans of beer per week    Comment: pt states he drinks 3-4 days out of the week     History  Drug Use No    Social History   Social History  . Marital status: Single    Spouse name: N/A  . Number of children: N/A  . Years of education: N/A   Social History Main Topics  . Smoking status: Current Every Day Smoker    Packs/day: 1.00    Types: Cigarettes  . Smokeless tobacco: Never Used  . Alcohol use 0.6 oz/week    1 Cans of beer per week     Comment: pt states he drinks 3-4 days out of the week  . Drug use: No  . Sexual activity: Not Currently   Other Topics Concern  . None   Social History Narrative  . None   Additional Social History:   Sleep: has been variable, slept better last night  Appetite:  Good  Current Medications: Current Facility-Administered Medications  Medication Dose Route Frequency Provider Last Rate  Last Dose  . acetaminophen (TYLENOL) tablet 650 mg  650 mg Oral Q6H PRN Ethelene Hal, NP   650 mg at 07/03/17 1200  . alum & mag hydroxide-simeth (MAALOX/MYLANTA) 200-200-20 MG/5ML suspension 30 mL  30 mL Oral Q4H PRN Ethelene Hal, NP      . busPIRone (BUSPAR) tablet 10 mg  10 mg Oral BID Cobos, Myer Peer, MD   10 mg at 07/05/17 1611  . chlordiazePOXIDE (LIBRIUM) capsule 25 mg  25 mg Oral Q6H PRN Cobos, Fernando A, MD       . chlordiazePOXIDE (LIBRIUM) capsule 25 mg  25 mg Oral BH-qamhs Cobos, Myer Peer, MD   25 mg at 07/05/17 0750   Followed by  . [START ON 07/06/2017] chlordiazePOXIDE (LIBRIUM) capsule 25 mg  25 mg Oral Daily Cobos, Fernando A, MD      . escitalopram (LEXAPRO) tablet 5 mg  5 mg Oral Daily Cobos, Myer Peer, MD   5 mg at 07/05/17 0748  . gabapentin (NEURONTIN) capsule 100 mg  100 mg Oral TID Cobos, Myer Peer, MD   100 mg at 07/05/17 1611  . hydrOXYzine (ATARAX/VISTARIL) tablet 25 mg  25 mg Oral Q6H PRN Cobos, Myer Peer, MD   25 mg at 07/05/17 1611  . loperamide (IMODIUM) capsule 2-4 mg  2-4 mg Oral PRN Cobos, Myer Peer, MD      . magnesium hydroxide (MILK OF MAGNESIA) suspension 30 mL  30 mL Oral Daily PRN Ethelene Hal, NP      . multivitamin with minerals tablet 1 tablet  1 tablet Oral Daily Cobos, Myer Peer, MD   1 tablet at 07/05/17 0747  . nicotine polacrilex (NICORETTE) gum 2 mg  2 mg Oral PRN Cobos, Myer Peer, MD   2 mg at 07/05/17 1611  . ondansetron (ZOFRAN-ODT) disintegrating tablet 4 mg  4 mg Oral Q6H PRN Cobos, Fernando A, MD      . potassium chloride SA (K-DUR,KLOR-CON) CR tablet 20 mEq  20 mEq Oral TID Lindell Spar I, NP   20 mEq at 07/05/17 1611  . thiamine (VITAMIN B-1) tablet 100 mg  100 mg Oral Daily Cobos, Myer Peer, MD   100 mg at 07/05/17 0747  . traZODone (DESYREL) tablet 50 mg  50 mg Oral QHS Lindell Spar I, NP   50 mg at 07/04/17 2138   Lab Results: No results found for this or any previous visit (from the past 48 hour(s)).  Blood Alcohol level:  Lab Results  Component Value Date   ETH 260 (H) 07/01/2017   ETH <10 62/70/3500   Metabolic Disorder Labs: No results found for: HGBA1C, MPG No results found for: PROLACTIN No results found for: CHOL, TRIG, HDL, CHOLHDL, VLDL, LDLCALC  Physical Findings: AIMS: Facial and Oral Movements Muscles of Facial Expression: None, normal Lips and Perioral Area: None, normal Jaw: None, normal Tongue: None,  normal,Extremity Movements Upper (arms, wrists, hands, fingers): None, normal Lower (legs, knees, ankles, toes): None, normal, Trunk Movements Neck, shoulders, hips: None, normal, Overall Severity Severity of abnormal movements (highest score from questions above): None, normal Incapacitation due to abnormal movements: None, normal Patient's awareness of abnormal movements (rate only patient's report): No Awareness, Dental Status Current problems with teeth and/or dentures?: No Does patient usually wear dentures?: No  CIWA:  CIWA-Ar Total: 0 COWS:  COWS Total Score: 1  Musculoskeletal: Strength & Muscle Tone: within normal limits Gait & Station: normal Patient leans: N/A  Psychiatric Specialty Exam: Physical Exam  Nursing note and vitals reviewed.   Review of Systems  All other systems reviewed and are negative. denies headache, no chest pain, no shortness of breath, no vomiting, no seizure like episodes since admission.  Blood pressure 135/79, pulse (!) 45, temperature 97.6 F (36.4 C), temperature source Oral, resp. rate 16, height 5' 9.5" (1.765 m), weight 68.5 kg (151 lb).Body mass index is 21.98 kg/m.  General Appearance: Well Groomed  Eye Contact:  Good  Speech:  Normal Rate  Volume:  Normal  Mood:  reports he is feeling better   Affect:  improving affect, reactive   Thought Process:  Linear and Descriptions of Associations: Intact  Orientation:  Full (Time, Place, and Person)  Thought Content:  denies hallucinations, no delusions, not internally preoccupied   Suicidal Thoughts:  No denies suicidal ideations , denies any self injurious ideations.  Homicidal Thoughts:  No, denies any homicidal or violent ideations  Memory:  recent and remote grossly intact   Judgement:  improving   Insight:  improving   Psychomotor Activity:  Normal- no tremors or diaphoresis. No psychomotor restlessness or agitation  Concentration:  Concentration: Good and Attention Span: Good   Recall:  Good  Fund of Knowledge:  Good  Language:  Good  Akathisia:  No  Handed:  Right  AIMS (if indicated):     Assets:  Communication Skills Desire for Improvement Resilience  ADL's:  Intact  Cognition:  WNL  Sleep:  Number of Hours: 6.75      Assessment - patient presents with improved mood and range of affect. Currently minimizes depression , affect is reactive, and denies any SI. He denies lingering or ongoing symptoms of alcohol/benzodiazepine withdrawal, and does not appear to be in any acute distress or discomfort . As he improves he is presenting future oriented, and expresses hope to discharge soon.  Treatment Plan Summary: Daily contact with patient to assess and evaluate symptoms and progress in treatment. Treatment plan reviewed as below today 10/4 Continue to encourage group and milieu participation to work on coping skills and symptom reduction Continue  Librium detox protocol  to minimize  bzd/alcohol withdrawal symptoms. Continue Buspar 10 mg bid for anxiety. Increase  Lexapro to 10 mg daily for depression/anxiety/ social phobia symptoms. Increase  Gabapentin to 200 mg tid for anxiety  Continue Trazodone 50 mg QHS PRN  for insomnia. Treatment team working on disposition planning options  Monitor EKG   Jenne Campus, MD 07/05/2017, 6:14 PM   Patient ID: Justin May, male   DOB: 13-Mar-1996, 21 y.o.   MRN: [id number]

## 2017-07-05 NOTE — Plan of Care (Addendum)
Problem: Education: Goal: Utilization of techniques to improve thought processes will improve Outcome: Progressing Patient denies SI/HI/AVH Goal: Knowledge of the prescribed therapeutic regimen will improve Outcome: Progressing Patient educated on all of his scheduled meds, and verbalized understanding of all, and took all his medications as scheduled.  Problem: Safety: Goal: Ability to disclose and discuss suicidal ideas will improve Outcome: Progressing Patient denies SI, verbally CFS on unit, and is being maintained on Q15 minute safety checks.  Comments: Patient observed to be blunted, but cooperative with care.  Patient observed to have mild tremors to b/l upper extremities, denies pain, reports that his quality if sleep has been good, reports a good appetite, and rates his emergy level as normal.  Patient denies being depressed today, reports his anxiety as "4", and being given Librium as scheduled.  `Patient listed his goal today as "attend group therapy and talk with my psychiatrist about my discharge plan".  Pt reported that what is important to him today is "to work on my discharge plan and to make it to all my classes".    Patient was calm and cooperative through all of the day, ate breakfast and lunch, and is currently eating dinner.  Patient reported feeling anxious prior to dinner and was given Atarax  for anxiety.  Q15 minute checks are in place, will continue to monitor.

## 2017-07-05 NOTE — BHH Group Notes (Signed)
LCSW Group Therapy Note  07/05/2017 1:15pm  Type of Therapy and Topic:  Group Therapy: Avoiding Self-Sabotaging and Enabling Behaviors  Participation Level:  Active   Description of Group:   In this group, patients will learn how to identify obstacles, self-sabotaging and enabling behaviors, as well as: what are they, why do we do them and what needs these behaviors meet. Discuss unhealthy relationships and how to have positive healthy boundaries with those that sabotage and enable. Explore aspects of self-sabotage and enabling in yourself and how to limit these self-destructive behaviors in everyday life.   Therapeutic Goals: 1. Patient will identify one obstacle that relates to self-sabotage and enabling behaviors 2. Patient will identify one personal self-sabotaging or enabling behavior they did prior to admission 3. Patient will state a plan to change the above identified behavior 4. Patient will demonstrate ability to communicate their needs through discussion and/or role play.   Summary of Patient Progress:  Justin May was attentive and engaged during today's processing group. He shared that he is ready for discharge in the morning and is happy with his discharge plan. Justin May continues to show progress in the group setting with improving insight.    Therapeutic Modalities:   Cognitive Behavioral Therapy Person-Centered Therapy Motivational Interviewing   [employer], LCSW 07/05/2017 1:24 PM

## 2017-07-05 NOTE — Progress Notes (Signed)
Patient attended wrap-up group and said that his day was a 9.5.  Patient said he was excited to be going home tomorrow. His coping skills were socializing, watching tv and eating.

## 2017-07-06 DIAGNOSIS — F1094 Alcohol use, unspecified with alcohol-induced mood disorder: Secondary | ICD-10-CM

## 2017-07-06 DIAGNOSIS — R001 Bradycardia, unspecified: Secondary | ICD-10-CM

## 2017-07-06 MED ORDER — ESCITALOPRAM OXALATE 5 MG PO TABS: 5.0000 mg | ORAL_TABLET | Freq: Every day | ORAL | 0 refills | Status: DC

## 2017-07-06 MED ORDER — BUSPIRONE HCL 10 MG PO TABS: 10.0000 mg | ORAL_TABLET | Freq: Two times a day (BID) | ORAL | 0 refills | Status: DC

## 2017-07-06 MED ORDER — TRAZODONE HCL 50 MG PO TABS: 50.0000 mg | ORAL_TABLET | Freq: Every day | ORAL | 0 refills | Status: DC

## 2017-07-06 MED ORDER — GABAPENTIN 100 MG PO CAPS: 200.0000 mg | ORAL_CAPSULE | Freq: Three times a day (TID) | ORAL | 0 refills | Status: DC

## 2017-07-06 MED ORDER — NICOTINE POLACRILEX 2 MG MT GUM: 2.0000 mg | CHEWING_GUM | OROMUCOSAL | 0 refills | Status: DC | PRN

## 2017-07-06 MED ORDER — ESCITALOPRAM OXALATE 5 MG PO TABS
5.0000 mg | ORAL_TABLET | Freq: Every day | ORAL | Status: DC
  Filled 2017-07-06: qty 1

## 2017-07-06 MED ORDER — HYDROXYZINE HCL 25 MG PO TABS: ORAL_TABLET | ORAL | 0 refills | Status: DC

## 2017-07-06 MED ORDER — ESCITALOPRAM OXALATE 10 MG PO TABS: 10.0000 mg | ORAL_TABLET | Freq: Every day | ORAL | 0 refills | Status: DC

## 2017-07-06 NOTE — Progress Notes (Signed)
Patient ID: Justin May, male   DOB: 03-18-1996, 21 y.o.   MRN: [id number]  Discharge Note- Belongings returned to patient at time of discharge. Discharge instructions and medications were reviewed with patient. Patient verbalized understanding of both medications and discharge instructions. Patient was discharged to lobby where his ride was waiting. He discharged in no apparent distress. Q15 minute safety checks were to be maintained until time of discharge.

## 2017-07-06 NOTE — BHH Suicide Risk Assessment (Addendum)
[facility] Discharge Suicide Risk Assessment   Principal Problem: Alcohol Use Disorder Discharge Diagnoses:  Patient Active Problem List   Diagnosis Date Noted  . Alcohol use disorder, moderate, dependence (HCC) [F10.20]   . Alcohol use with alcohol-induced mood disorder ([facility]) [F10.94] 07/01/2017  . Adjustment disorder with mixed disturbance of emotions and conduct [F43.25] 05/01/2017  . Tobacco use disorder [F17.200] 04/14/2017  . 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 Alcohol abuse [F10.10] 04/14/2017  . Insomnia [G47.00] 04/14/2017  . Anxiety state [F41.1] 06/16/2016  . Social phobia [F40.10] 06/16/2016    Total Time spent with patient: 30 minutes  Musculoskeletal: Strength & Muscle Tone: within normal limits Gait & Station: normal Patient leans: N/A  Psychiatric Specialty Exam: ROS denies headache, no chest pain, no shortness of breath, no vomiting , denies any dizziness or lightheadedness   Blood pressure 126/88, pulse (!) 51, temperature 97.9 F (36.6 C), resp. rate 16, height 5' 9.5" (1.765 m), weight 68.5 kg (151 lb).Body mass index is 21.98 kg/m.  General Appearance: Well Groomed  Eye Contact::  Good  Speech:  Normal Rate409  Volume:  Normal  Mood:  reports his mood is "OK", denies depression and presents euthymic at this time  Affect:  Appropriate and Full Range  Thought Process:  Linear and Descriptions of Associations: Intact  Orientation:  Full (Time, Place, and Person)  Thought Content:  denies hallucinations, no delusions, not internally preoccupied   Suicidal Thoughts:  No  Homicidal Thoughts:  No  Memory:  recent and remote grossly intact   Judgement:  Other:  improving  Insight:  improving   Psychomotor Activity:  Normal  Concentration:  Good  Recall:  Good  Fund of Knowledge:Good  Language: Good  Akathisia:  Negative  Handed:  Right  AIMS (if indicated):     Assets:  Communication Skills Desire for Improvement Resilience   Sleep:  Number of Hours: 6.5  Cognition: WNL  ADL's:  Intact   Mental Status Per Nursing Assessment::   On Admission:     Demographic Factors:  21 year old male , no children, lives with roommate  Loss Factors: Substance, alcohol abuse   Historical Factors: History of alcohol use disorder, history of BZD Dependence, recent isolated seizure, states was witnessed by friend, felt by patient to be related to BZD withdrawal, as had stopped prescribed Ativan a few days prior. History of anxiety.  Risk Reduction Factors:   Sense of responsibility to family, Living with another person, especially a relative and Positive coping skills or problem solving skills  Continued Clinical Symptoms:  At this time patient is alert, attentive, well related, presents calm and pleasant on approach, mood is described as improved and presents euthymic, with full range of affect, no thought disorder, no suicidal or self injurious ideations, no homicidal or violent ideations  Behavior on unit is in good control, pleasant on approach. No current symptoms of withdrawal- no tremors, no diaphoresis, no psychomotor agitation . Of note, patient has presented with bradycardia. He has reported no associated symptoms- no dizziness , no lightheadedness, no pre-syncopal symptoms. He states he is aware his pulse tends to be low, which he attributes to being young and exercising often. States he has been told several times in the past that his pulse is low, and that he has not had associated symptoms. EKG done and I have reviewed case and EKG with Cardiology Consultant, have also reviewed with pharmacist - recommendation that bradycardia is benign  and does  not require any current management nor is a barrier for discharge today . Would discontinue Trazodone, have reviewed with patient he should not resume Doxepin ( which he had taken in the past, prior to admission), and would decrease Lexapro to 5 mgrs QDAY. Patient expressed  understanding .   Cognitive Features That Contribute To Risk:  No gross cognitive deficits noted upon discharge. Is alert , attentive, and oriented x 3   Suicide Risk:  Mild:  Suicidal ideation of limited frequency, intensity, duration, and specificity.  There are no identifiable plans, no associated intent, mild dysphoria and related symptoms, good self-control (both objective and subjective assessment), few other risk factors, and identifiable protective factors, including available and accessible social support.  Follow-up Information    [facility] Follow up on 07/12/2017.   Why:  Hospital follow-up with Dr. Rene Kocher on Thursday 10/11 at 8:00AM. You will be assessed for counseling services as well if interested. Thank you.  Contact information: [address] [place], [place] [address] Phone: [phone] Fax: [phone]          Plan Of Care/Follow-up recommendations:  Activity:  as tolerated  Diet:  Regular Tests:  NA Other:  See below  Patient is expressing readiness for discharge and is leaving in good spirits  Plans to follow up as above  Importance of avoiding people, places and situations related to alcohol or drug use emphasized, in order to decrease risk of relapse 12 step program participation recommended  Patient agrees to follow up with PCP regarding bradycardia, and instructed to seek prompt care if any cardiovascular symptoms  Craige Cotta, MD 07/06/2017, 11:15 AM

## 2017-07-06 NOTE — Progress Notes (Signed)
Called by Dr. Jama Flavors regarding asymptomatic bradycardia in a young patient with recent etoh abuse. Recent tox screens are negative, Etoh level was 260 during recent ER visit. On gabapentin and SNRI. No clear etiology - reports no chest pain, fatigue or DOE. Likely related to increased vagal tone. Would advise monitoring and avoid heart rate lowering medications. No specific cardiac follow-up is necessary unless he is symptomatic with this.  Feel free to call with questions.  Chrystie Nose, MD, [facility]  [place]  [facility]  Attending Cardiologist  Direct Dial: [phone]  Fax: [phone]  Website:  [email or web address]

## 2017-07-06 NOTE — Progress Notes (Signed)
Paged by psychiatry Dr. Jama Flavors regarding asymptomatic bradycardia. Reviewed the EKG and patient is bradycardic but no findings of ST elevation or depression. Review prior EKGs and patient was not bradycardic. Patient does have history of chronic use of stimulants that could explain faster heart rate. Given patient's stability he should be safe for discharge and should follow-up with his primary care doctor for workup of his bradycardia. It is reassuring that the patient has been participating in vigorous physical activity prior to discharge with no issues.  Note: case was discussed with cardiology.  On follow-up with PCP should get an event monitor and ECHO.  Haydee Salter MD [facility]

## 2017-07-06 NOTE — Progress Notes (Signed)
Recreation Therapy Notes  Date: 07/06/17 Time: 0930 Location: [address] Dayroom  Group Topic: Stress Management  Goal Area(s) Addresses:  Patient will verbalize importance of using healthy stress management.  Patient will identify positive emotions associated with healthy stress management.   Behavioral Response: Engaged  Intervention: Stress Management  Activity :  Progressive Muscle Relaxation.  LRT introduced the stress management technique of progressive muscle relaxation.  LRT read a script to guide patients through tensing and relaxing each muscle group.  Patients were to follow along as script was read to engage in the activity.  Education:  Stress Management, Discharge Planning.   Education Outcome: Acknowledges edcuation/In group clarification offered/Needs additional education  Clinical Observations/Feedback: Pt attended group.   Caroll Rancher, LRT/CTRS        Lillia Abed, Thurmond Hildebran A 07/06/2017 11:12 AM

## 2017-07-06 NOTE — Progress Notes (Signed)
Patient ID: Justin May, male   DOB: [birth year], 21 y.o.   MRN: [id number]  DAR: Pt. Denies SI/HI and A/V Hallucinations. He reports sleep last night for him went well, his appetite is good, energy level is high, and concentration is good. He rates his depression level today 1/10, hopelessness level 0/10, and anxiety level 3/10. Patient does reports some discomfort and soreness in his right arm due to a recent vaccination he received. However, when asked if he would like an intervention he refused. Support and encouragement provided to the patient. Scheduled medications administered to patient per physician's orders. Patient is receptive and cooperative. 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 He reports that he is looking forward to discharging and hopes to, "continue to stay clean." Q15 minute checks are maintained for safety.

## 2017-07-06 NOTE — Progress Notes (Signed)
  [facility] Adult Case Management Discharge Plan :  Will you be returning to the same living situation after discharge:  Yes,  pt returning home. At discharge, do you have transportation home?: Yes,  pt's mother will transport. Do you have the ability to pay for your medications: Yes,  pt has insurance.  Release of information consent forms completed and in the chart;  Patient's signature needed at discharge.  Patient to Follow up at: Follow-up Information    [facility] Follow up on 07/12/2017.   Why:  Hospital follow-up with Dr. Rene Kocher on Thursday 10/11 at 8:00AM. You will be assessed for counseling services as well if interested. Thank you.  Contact information: [address] [place], [place] [address] Phone: [phone] Fax: [phone]          Next level of care provider has access to [facility] Link:yes  Safety Planning and Suicide Prevention discussed: Yes,  with pt and with pt's mother.  Have you used any form of tobacco in the last 30 days? (Cigarettes, Smokeless Tobacco, Cigars, and/or Pipes): Yes  Has patient been referred to the Quitline?: Patient refused referral  Patient has been referred for addiction treatment: Yes  Jonathon Jordan, MSW, LCSWA 07/06/2017, 9:34 AM

## 2017-07-06 NOTE — Discharge Summary (Signed)
Physician Discharge Summary Note  Patient:  Justin May is an 21 y.o., male MRN:  [id number] DOB:  1996/09/02 Patient phone:  [phone] (home)   Patient address:   [address] [place] [address],   Total Time spent with patient: Greater than 30 minutes  Date of Admission:  07/02/2017 Date of Discharge: 07-06-17  Reason for Admission: Substance withdrawal related seizure activities.  Principal Problem: Alcohol use disorder, moderate, dependence, Alcohol induced mood disorder.  Discharge Diagnoses: Patient Active Problem List   Diagnosis Date Noted  . Alcohol use disorder, moderate, dependence (HCC) [F10.20]   . Alcohol use with alcohol-induced mood disorder (HCC) [F10.94] 07/01/2017  . Adjustment disorder with mixed disturbance of emotions and conduct [F43.25] 05/01/2017  . Tobacco use disorder [F17.200] 04/14/2017  . 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 Alcohol abuse [F10.10] 04/14/2017  . Insomnia [G47.00] 04/14/2017  . Anxiety state [F41.1] 06/16/2016  . Social phobia [F40.10] 06/16/2016   Past Psychiatric History: Alcoholism, chronic  Past Medical History:  Past Medical History:  Diagnosis Date  . Anxiety   . Asthma   . Seizures (HCC)    History reviewed. No pertinent surgical history.  Family History:  Family History  Problem Relation Age of Onset  . Hypertension Father    Family Psychiatric  History: See H&P  Social History:  History  Alcohol Use  . 0.6 oz/week  . 1 Cans of beer per week    Comment: pt states he drinks 3-4 days out of the week     History  Drug Use No    Social History   Social History  . Marital status: Single    Spouse name: N/A  . Number of children: N/A  . Years of education: N/A   Social History Main Topics  . Smoking status: Current Every Day Smoker    Packs/day: 1.00    Types: Cigarettes  . Smokeless tobacco: Never Used  . Alcohol use 0.6 oz/week    1 Cans of beer per week      Comment: pt states he drinks 3-4 days out of the week  . Drug use: No  . Sexual activity: Not Currently   Other Topics Concern  . None   Social History Narrative  . None   Hospital Course: 21 year old single male .Employed at [employer], lives with a roommate . Reports recent seizure - 4 days ago (denies prior history of seizures or recent head trauma) . Patient states he does not remember event and states it was witnessed by his friend, who stated " I looked like I was shaking and tensing up". Went to ED , states he was worked up with head MRI which was reported to him as normal, and was discharged. Patient suspects this seizure was related to " not taking any Ativan for a week", and " having a lot of stress ".  States he had a second similar event a day later leading to going back to ED , but states " that was not a seizure, it was a black out from drinking and using Ativan". Admission BAL on 9/30 was 230, UDS was negative. In ED presented as combative, aggressive and was IVCd . States he drinks " a few times a week", states he drinks 3 beers per episode. He states he is prescribed Ativan for anxiety, which he had been taking at 2 mgrs TID . As noted, reports he had stopped it about a week ago.  Denies drug abuse. History of anxiety , which he characterizes mostly as social anxiety/phobia. No history of suicide attempts, denies history of self cutting, denies history of psychosis, denies history of mania. Denies history of severe depression. States a combination of Lexapro and Buspar in the past helped his anxiety . States Zoloft was not helpful. Had been prescribed Neurontin prior to admission.   After the above admission admission assessment, Justin May was started on the medication regimen for his presenting symptoms. He received Librium detox protocols for alcohol detox. He was also medicated & discharged on Lexapro 5 mg daily for depression, Buspar 10 mg for anxiety, Gabapentin 200 mg for  agitation/alcohol withdrawal syndrome & Nicorette gum 2 mg for smoking cessation. He was enrolled & participated in the group counseling sessions being offered & held on this unit. He learned coping skills. He presented no other significant health issues that required treatment & or monitoring.  Justin May is seen today by the attending psychiatrist. He is alert, attentive, well related, presents calm and pleasant on approach. His mood is described as improved and presents euthymic with full range of affect. There is no thought disorder, no suicidal or self injurious ideations, no homicidal or violent ideations. Behavior on the unit is in good control, pleasant on approach. No current symptoms of withdrawal- no tremors, no diaphoresis, no psychomotor agitation.  Of note, patient has presented with bradycardia. He has reported no associated symptoms - no dizziness, no lightheadedness, no pre-syncopal symptoms. He states he is aware his pulse tends to be low, which he attributes to being young and exercising often. He states he has been told several times in the past that his pulse is low, and that he has not had any associated symptoms. EKG has been done & the psychiatrist has reviewed case and EKG with Cardiology Consultant, have also reviewed with pharmacist - recommendation that bradycardia is benign  and does not require any current management nor is a barrier for discharge today . Would discontinue Trazodone, have reviewed with patient he should not resume Doxepin (which he had taken in the past, prior to admission), and would decrease Lexapro to 5 mgrs QDAY. Patient expressed understanding.  Upon discharge, he denies any SIHI, AVH, delusional thoughts or paranoia. He left [facility] with all personal belongings in no apparent distress. Transportation per mother.  Physical Findings: AIMS: Facial and Oral Movements Muscles of Facial Expression: None, normal Lips and Perioral Area: None, normal Jaw: None,  normal Tongue: None, normal,Extremity Movements Upper (arms, wrists, hands, fingers): None, normal Lower (legs, knees, ankles, toes): None, normal, Trunk Movements Neck, shoulders, hips: None, normal, Overall Severity Severity of abnormal movements (highest score from questions above): None, normal Incapacitation due to abnormal movements: None, normal Patient's awareness of abnormal movements (rate only patient's report): No Awareness, Dental Status Current problems with teeth and/or dentures?: No Does patient usually wear dentures?: No  CIWA:  CIWA-Ar Total: 0 COWS:  COWS Total Score: 1  Musculoskeletal: Strength & Muscle Tone: within normal limits Gait & Station: normal Patient leans: N/A  Psychiatric Specialty Exam: Physical Exam  Constitutional: He is oriented to person, place, and time. He appears well-developed.  HENT:  Head: Normocephalic.  Eyes: Pupils are equal, round, and reactive to light.  Neck: Normal range of motion.  Cardiovascular: Normal rate.   Respiratory: Effort normal.  GI: Soft.  Genitourinary:  Genitourinary Comments: Deferred  Musculoskeletal: Normal range of motion.  Neurological: He is alert and oriented to person, place, and time.  Skin: Skin is warm and dry.    Review of Systems  Constitutional: Negative.   HENT: Negative.   Eyes: Negative.   Respiratory: Negative.   Cardiovascular: Negative.   Gastrointestinal: Negative.   Genitourinary: Negative.   Musculoskeletal: Negative.   Skin: Negative.   Neurological: Negative.   Endo/Heme/Allergies: Negative.   Psychiatric/Behavioral: Positive for depression (Stable) and substance abuse (Hx. alcoholism, chronic.). Negative for hallucinations, memory loss and suicidal ideas. The patient has insomnia (Stable). The patient is not nervous/anxious.     Blood pressure 126/88, pulse (!) 51, temperature 97.9 F (36.6 C), resp. rate 16, height 5' 9.5" (1.765 m), weight 68.5 kg (151 lb).Body mass index is  21.98 kg/m.  See Md's SRA   Have you used any form of tobacco in the last 30 days? (Cigarettes, Smokeless Tobacco, Cigars, and/or Pipes): Yes  Has this patient used any form of tobacco in the last 30 days? (Cigarettes, Smokeless Tobacco, Cigars, and/or Pipes):Yes, recommended Nicorette gum 2 mg upon discharge to aid smoking cessation.  Blood Alcohol level:  Lab Results  Component Value Date   ETH 260 (H) 07/01/2017   ETH <10 06/29/2017   Metabolic Disorder Labs:  No results found for: HGBA1C, MPG No results found for: PROLACTIN No results found for: CHOL, TRIG, HDL, CHOLHDL, VLDL, LDLCALC  See Psychiatric Specialty Exam and Suicide Risk Assessment completed by Attending Physician prior to discharge.  Discharge destination:  Home  Is patient on multiple antipsychotic therapies at discharge:  No   Has Patient had three or more failed trials of antipsychotic monotherapy by history:  No  Recommended Plan for Multiple Antipsychotic Therapies: NA  Allergies as of 07/06/2017   No Known Allergies     Medication List    STOP taking these medications   doxepin 50 MG capsule Commonly known as:  SINEQUAN   LORazepam 2 MG tablet Commonly known as:  ATIVAN   sertraline 50 MG tablet Commonly known as:  ZOLOFT     TAKE these medications     Indication  busPIRone 10 MG tablet Commonly known as:  BUSPAR Take 1 tablet (10 mg total) by mouth 2 (two) times daily. For anxiety  Indication:  Anxiety Disorder   escitalopram 5 MG tablet Commonly known as:  LEXAPRO Take 1 tablet (5 mg total) by mouth daily. For depression  Indication:  Generalized Anxiety Disorder, Major Depressive Disorder   gabapentin 100 MG capsule Commonly known as:  NEURONTIN Take 2 capsules (200 mg total) by mouth 3 (three) times daily. For agitation What changed:  medication strength  how much to take  additional instructions  Indication:  Agitation, Alcohol Withdrawal Syndrome   hydrOXYzine 25 MG  tablet Commonly known as:  ATARAX/VISTARIL Take 1 tablet (25 mg) four times daily as needed: For anxiety  Indication:  Feeling Anxious   nicotine polacrilex 2 MG gum Commonly known as:  NICORETTE Take 1 each (2 mg total) by mouth as needed for smoking cessation. (May buy from over the counter at your local pharmacy): For smoking cessation.  Indication:  Nicotine Addiction      Follow-up Information    [facility] Follow up on 07/12/2017.   Why:  Hospital follow-up with Dr. Rene Kocher on Thursday 10/11 at 8:00AM. You will be assessed for counseling services as well if interested. Thank you.  Contact information: [address] [place], [place] [address] Phone: [phone] Fax: [phone]         Follow-up recommendations: Activity:  As  tolerated Diet: As recommended by your primary care doctor. Keep all scheduled follow-up appointments as recommended.   Comments: Patient is instructed prior to discharge to: Take all medications as prescribed by his/her mental healthcare provider. Report any adverse effects and or reactions from the medicines to his/her outpatient provider promptly. Patient has been instructed & cautioned: To not engage in alcohol and or illegal drug use while on prescription medicines. In the event of worsening symptoms, patient is instructed to call the crisis hotline, 911 and or go to the nearest ED for appropriate evaluation and treatment of symptoms. To follow-up with his/her primary care provider for your other medical issues, concerns and or health care needs.   Signed: Sanjuana Kava, NP, PMHNP, FNP-BC 07/06/2017, 2:09 PM  Patient seen, Suicide Assessment Completed.  Disposition Plan Reviewed

## 2017-07-08 NOTE — Progress Notes (Signed)
Subjective:    Patient ID: Justin May, male    DOB: 12/29/1995, 21 y.o.   MRN: [id number] Chief Complaint  Patient presents with  . Follow-up    hospital visit  . Depression    answers positive in triage    HPI   Justin May is a delightful 21 yo male who is here to f/u on his recent inpt behavioral health hosp from 10/1-10/5 after a second seizure was witnessed in sev d.  The first was 9/28 and suspected to be due to bzd w/d - reported he was out of ativan x 1 wk so started drinking and had sev EtOH beverages. His roommate witnessed 3-5 min of generalized tonic-clonic sz followed by confusion (did not loose control of bladder/bowel nor bit tongue, injury self at all during either episode) after which he was taken to the ER. I rx'ed pt #90 lorazepam  on 9/13 which apparently he used in 8 days!!!!!!!!!! Labs and head CT nml/neg. Rx'd #9 lorazepam by the ER when he was d/c'd on Sat 9/29 to get him through the wkend w/ instructions to f/u w/ me in 2d but <24 hrs later EMS brought pt back to the ER after roommate called 911 when she witnessed a second sz. That time the pt was intoxicated and agitated, belligerent, combative so an IVC was obtained. Had again been drinking EtOH. No BZD in urine despite being given some IV in the ER the prior day and d/c'd w/ a rx for them.  Psych d/c sum stated pt does not remember initial sz episode which was suspected to be due to out of bzd x 1 wk but second episode was different - due to acute intoxication with EtOH AND BZD. Pt admitted to drinking about 3 beers a few x/wk regularly so was put on librium EtOH detox protocol.  Continued to endorse overwhelming social anxiety/phobia.  Reported to inpt psych that zoloft was ineffective but prior rxs for lexapro, buspar, and neurontin did benefit anxiety. (Also failed effexor prior.  Unfortunately, pt has several severe episodes and a psych hosp while he was on lexapro prior so surprised that he endorsed prior benefit from  this.)  Therefore, pt was d'cd on lexapro 5 qd, buspar 10 bid, gabapentin 200 tid, hydroxyzine 25 qid prn anxiety, and nicorette gum  prn.  Psych did note that pt had asymptomatic sinus bradycardia and w/ pharmacy recs advised to d/c TCAs - trazodone, doxepin - and not to increase lexapro >5mg .   Pt has appt with psych this Thurs 10/11 at 8am with Dr. Rene Kocher.  +FHx of seizures in uncle.  Had never had anything like this prior. Does occasionally feel pre-syncopal when standing.  Had a HA prior which felt like a ton of pressure.  States the second time he blacked out from EtOH after he had taken 2 tabs ativan early in the day.   Past Medical History:  Diagnosis Date  . Anxiety   . Asthma   . Seizures (HCC)    No past surgical history on file. Current Outpatient Prescriptions on File Prior to Visit  Medication Sig Dispense Refill  . escitalopram (LEXAPRO) 5 MG tablet Take 1 tablet (5 mg total) by mouth daily. For depression 30 tablet 0  . gabapentin (NEURONTIN) 100 MG capsule Take 2 capsules (200 mg total) by mouth 3 (three) times daily. For agitation 180 capsule 0  . hydrOXYzine (ATARAX/VISTARIL) 25 MG tablet Take 1 tablet (25 mg) four times daily as needed:  For anxiety 60 tablet 0  . busPIRone (BUSPAR) 10 MG tablet Take 1 tablet (10 mg total) by mouth 2 (two) times daily. For anxiety (Patient not taking: Reported on 07/09/2017) 60 tablet 0  . nicotine polacrilex (NICORETTE) 2 MG gum Take 1 each (2 mg total) by mouth as needed for smoking cessation. (May buy from over the counter at your local pharmacy): For smoking cessation. (Patient not taking: Reported on 07/09/2017) 100 tablet 0   No current facility-administered medications on file prior to visit.    No Known Allergies Family History  Problem Relation Age of Onset  . Hypertension Father    Social History   Social History  . Marital status: Single    Spouse name: N/A  . Number of children: N/A  . Years of education: N/A    Social History Main Topics  . Smoking status: Current Every Day Smoker    Packs/day: 1.00    Types: Cigarettes  . Smokeless tobacco: Never Used  . Alcohol use 0.6 oz/week    1 Cans of beer per week     Comment: pt states he drinks 3-4 days out of the week  . Drug use: No  . Sexual activity: Not Currently   Other Topics Concern  . None   Social History Narrative  . None   Depression screen [facility] 2/9 07/09/2017 05/17/2017 05/17/2017 04/26/2017 04/12/2017  Decreased Interest 0 3  Down, Depressed, Hopeless 2 - 1 0 1  PHQ - 2 Score 0 4  Altered sleeping 3 - 0 - 3  Tired, decreased energy 0 - 0 - 1  Change in appetite 1 - 0 - 3  Feeling bad or failure about yourself  1 - 2 - 1  Trouble concentrating 2 - 2 - 0  Moving slowly or fidgety/restless 2 - 1 - 3  Suicidal thoughts 0 - 0 - 0  PHQ-9 Score 12 - 7 - 15  Difficult doing work/chores - - Somewhat difficult - Extremely dIfficult    Review of Systems See hpi    Objective:   Physical Exam  Constitutional: He is oriented to person, place, and time. He appears well-developed and well-nourished. No distress.  HENT:  Head: Normocephalic and atraumatic.  Eyes: Pupils are equal, round, and reactive to light. Conjunctivae are normal. No scleral icterus.  Neck: Normal range of motion. Neck supple. No thyromegaly present.  Cardiovascular: Normal rate, regular rhythm, normal heart sounds and intact distal pulses.   Pulmonary/Chest: Effort normal and breath sounds normal. No respiratory distress.  Musculoskeletal: He exhibits no edema.  Lymphadenopathy:    He has no cervical adenopathy.  Neurological: He is alert and oriented to person, place, and time.  Skin: Skin is warm and dry. He is not diaphoretic.  Psychiatric: He has a normal mood and affect. His behavior is normal.      BP 100/66 (BP Location: Right Arm, Patient Position: Sitting, Cuff Size: Normal)   Pulse (!) 42   Temp (!) 97.5 F (36.4 C) (Oral)   Resp 16    Ht 5' 9.75" (1.772 m)   Wt 156 lb 12.8 oz (71.1 kg)   SpO2 100%   BMI 22.66 kg/m   Orthostatic VS for the past 24 hrs (Last 3 readings):  BP- Lying Pulse- Lying BP- Sitting Pulse- Sitting BP- Standing at 0 minutes Pulse- Standing at 0 minutes  07/09/17 0951 119/66 (!) 40 136/78 (!) 45 128/82 62   EKG: NSR,  no acute ischemic changes noted. No significant change noted when compared to prior EKG done 07/05/2017   I have personally reviewed the EKG tracing and agree with the computer interpretation.     Assessment & Plan:  CANCEL APPT W/ ME FOR THIS THURS Consider: [facility] [address] [place], [place], [place] [address] Phone: [phone]  1. Bradycardia - suspect due to young active male, asymptomatic. Monitor.  2. Convulsions, unspecified convulsion type (HCC)   3. Social phobia   4. Anxiety state - has appt to est w/ psych Dr. Viviann [facility] tomorrow at 3pm for a SA IOP which pt would be willing to do. Also has appt with to est w/ Dr. Kizzie Ide (3d) at 8 a.m. Can see what he thinks is going to work best for him  5. Alcohol abuse - has not been honest w/ me prior about the freq of his drinking EtOH - is freq avail from his roommate despite being under age.  6. Alcohol use with alcohol-induced mood disorder (HCC)   7. Mild benzodiazepine use disorder (HCC) - appears he was using approx 10 tabs of lorazepam  daily for a week after our visit last mo if he was not diverting. . .   8. Tobacco use disorder   9. Insomnia, unspecified type - can't use TCA due to prolonged QT/bradycardia. Try low dose seroquel - could also potentially try rozerem or belsoma if needed in future.  H    Orders Placed This Encounter  Procedures  . CBC with Differential/Platelet  . Comprehensive metabolic panel  . TSH  . Orthostatic vital signs  . EKG 12-Lead    Meds ordered this encounter  Medications  . DISCONTD: QUEtiapine (SEROQUEL) 50 MG tablet    Sig: Take 1 tablet (50 mg total) by mouth  at bedtime.    Dispense:  30 tablet    Refill:  0  . QUEtiapine (SEROQUEL) 50 MG tablet    Sig: Take 1 tablet (50 mg total) by mouth at bedtime.    Dispense:  30 tablet    Refill:  0   [name], M.D.  Primary Care at [facility] [address] [place], [place] [address] [phone] phone [phone] fax  07/11/17 12:57 AM

## 2017-07-09 ENCOUNTER — Encounter: Payer: Self-pay | Admitting: Family Medicine

## 2017-07-09 ENCOUNTER — Ambulatory Visit (INDEPENDENT_AMBULATORY_CARE_PROVIDER_SITE_OTHER): Payer: Commercial Managed Care - PPO | Admitting: Family Medicine

## 2017-07-09 VITALS — BP 100/66 | HR 42 | Temp 97.5°F | Resp 16 | Ht 69.75 in | Wt 156.8 lb

## 2017-07-09 DIAGNOSIS — F1010 Alcohol abuse, uncomplicated: Secondary | ICD-10-CM

## 2017-07-09 DIAGNOSIS — F4010 Social phobia, unspecified: Secondary | ICD-10-CM

## 2017-07-09 DIAGNOSIS — G4700 Insomnia, unspecified: Secondary | ICD-10-CM | POA: Diagnosis not present

## 2017-07-09 DIAGNOSIS — F411 Generalized anxiety disorder: Secondary | ICD-10-CM

## 2017-07-09 DIAGNOSIS — R001 Bradycardia, unspecified: Secondary | ICD-10-CM | POA: Diagnosis not present

## 2017-07-09 DIAGNOSIS — R569 Unspecified convulsions: Secondary | ICD-10-CM

## 2017-07-09 DIAGNOSIS — F1310 Sedative, hypnotic or anxiolytic abuse, uncomplicated: Secondary | ICD-10-CM

## 2017-07-09 DIAGNOSIS — F17200 Nicotine dependence, unspecified, uncomplicated: Secondary | ICD-10-CM

## 2017-07-09 DIAGNOSIS — F1094 Alcohol use, unspecified with alcohol-induced mood disorder: Secondary | ICD-10-CM

## 2017-07-09 MED ORDER — QUETIAPINE FUMARATE 50 MG PO TABS: 50.0000 mg | ORAL_TABLET | Freq: Every day | ORAL | 0 refills | Status: DC

## 2017-07-09 NOTE — Patient Instructions (Addendum)
CANCEL APPT FOR THIS THURS with me. Plan to keep appt with Dr. Gaspar Skeeters for 8 am Thursday   [facility] [address], [place], [place] [address] Phone: [phone] They have an intake visit for you tomorrow at 3 pm. Dr. Riki Sheer - arrive at 2:45 w/ insurance info.  [facility] - 8 week, 4 days a week w/ medication management.  IF you received an x-ray today, you will receive an invoice from [facility]. Please contact [facility] at [phone] with questions or concerns regarding your invoice.   IF you received labwork today, you will receive an invoice from [facility]. Please contact [facility] at [phone] with questions or concerns regarding your invoice.   Our billing staff will not be able to assist you with questions regarding bills from these companies.  You will be contacted with the lab results as soon as they are available. The fastest way to get your results is to activate your My Chart account. Instructions are located on the last page of this paperwork. If you have not heard from [place] regarding the results in 2 weeks, please contact this office.     Orthostatic Hypotension Orthostatic hypotension is a sudden drop in blood pressure that happens when you quickly change positions, such as when you get up from a seated or lying position. Blood pressure is a measurement of how strongly, or weakly, your blood is pressing against the walls of your arteries. Arteries are blood vessels that carry blood from your heart throughout your body. When blood pressure is too low, you may not get enough blood to your brain or to the rest of your organs. This can cause weakness, light-headedness, rapid heartbeat, and fainting. This can last for just a few seconds or for up to a few minutes. Orthostatic hypotension is usually not a serious problem. However, if it happens frequently or gets worse, it may be a sign of something more serious. What are the causes? This condition  may be caused by:  Sudden changes in posture, such as standing up quickly after you have been sitting or lying down.  Blood loss.  Loss of body fluids (dehydration).  Heart problems.  Hormone (endocrine) problems.  Pregnancy.  Severe infection.  Lack of certain nutrients.  Severe allergic reactions (anaphylaxis).  Certain medicines, such as blood pressure medicine or medicines that make the body lose excess fluids (diuretics). Sometimes, this condition can be caused by not taking medicine as directed, such as taking too much of a certain medicine.  What increases the risk? Certain factors can make you more likely to develop orthostatic hypotension, including:  Age. Risk increases as you get older.  Conditions that affect the heart or the central nervous system.  Taking certain medicines, such as blood pressure medicine or diuretics.  Being pregnant.  What are the signs or symptoms? Symptoms of this condition may include:  Weakness.  Light-headedness.  Dizziness.  Blurred vision.  Fatigue.  Rapid heartbeat.  Fainting, in severe cases.  How is this diagnosed? This condition is diagnosed based on:  Your medical history.  Your symptoms.  Your blood pressure measurement. Your health care provider will check your blood pressure when you are: ? Lying down. ? Sitting. ? Standing.  A blood pressure reading is recorded as two numbers, such as "120 over 80" (or 120/80). The first ("top") number is called the systolic pressure. It is a measure of the pressure in your arteries as your heart beats. The second ("  bottom") number is called the diastolic pressure. It is a measure of the pressure in your arteries when your heart relaxes between beats. Blood pressure is measured in a unit called mm Hg. Healthy blood pressure for adults is 120/80. If your blood pressure is below 90/60, you may be diagnosed with hypotension. Other information or tests that may be used to  diagnose orthostatic hypotension include:  Your other vital signs, such as your heart rate and temperature.  Blood tests.  Tilt table test. For this test, you will be safely secured to a table that moves you from a lying position to an upright position. Your heart rhythm and blood pressure will be monitored during the test.  How is this treated? Treatment for this condition may include:  Changing your diet. This may involve eating more salt (sodium) or drinking more water.  Taking medicines to raise your blood pressure.  Changing the dosage of certain medicines you are taking that might be lowering your blood pressure.  Wearing compression stockings. These stockings help to prevent blood clots and reduce swelling in your legs.  In some cases, you may need to go to the hospital for:  Fluid replacement. This means you will receive fluids through an IV tube.  Blood replacement. This means you will receive donated blood through an IV tube (transfusion).  Treating an infection or heart problems, if this applies.  Monitoring. You may need to be monitored while medicines that you are taking wear off.  Follow these instructions at home: Eating and drinking   Drink enough fluid to keep your urine clear or pale yellow.  Eat a healthy diet and follow instructions from your health care provider about eating or drinking restrictions. A healthy diet includes: ? Fresh fruits and vegetables. ? Whole grains. ? Lean meats. ? Low-fat dairy products.  Eat extra salt only as directed. Do not add extra salt to your diet unless your health care provider told you to do that.  Eat frequent, small meals.  Avoid standing up suddenly after eating. Medicines  Take over-the-counter and prescription medicines only as told by your health care provider. ? Follow instructions from your health care provider about changing the dosage of your current medicines, if this applies. ? Do not stop or adjust  any of your medicines on your own. General instructions  Wear compression stockings as told by your health care provider.  Get up slowly from lying down or sitting positions. This gives your blood pressure a chance to adjust.  Avoid hot showers and excessive heat as directed by your health care provider.  Return to your normal activities as told by your health care provider. Ask your health care provider what activities are safe for you.  Do not use any products that contain nicotine or tobacco, such as cigarettes and e-cigarettes. If you need help quitting, ask your health care provider.  Keep all follow-up visits as told by your health care provider. This is important. Contact a health care provider if:  You vomit.  You have diarrhea.  You have a fever for more than 2-3 days.  You feel more thirsty than usual.  You feel weak and tired. Get help right away if:  You have chest pain.  You have a fast or irregular heartbeat.  You develop numbness in any part of your body.  You cannot move your arms or your legs.  You have trouble speaking.  You become sweaty or feel lightheaded.  You faint.  You  feel short of breath.  You have trouble staying awake.  You feel confused. This information is not intended to replace advice given to you by your health care provider. Make sure you discuss any questions you have with your health care provider. Document Released: 09/08/2002 Document Revised: 06/06/2016 Document Reviewed: 03/10/2016 Elsevier Interactive Patient Education  2018 [employer].  Bradycardia, Adult Bradycardia is a slower-than-normal heartbeat. A normal resting heart rate for an adult ranges from 60 to 100 beats per minute. With bradycardia, the resting heart rate is less than 60 beats per minute. Bradycardia can prevent enough oxygen from reaching certain areas of your body when you are active. It can be serious if it keeps enough oxygen from reaching your brain  and other parts of your body. Bradycardia is not a problem for everyone. For some healthy adults, a slow resting heart rate is normal. What are the causes? This condition may be caused by:  A problem with the heart, including: ? A problem with the heart's electrical system, such as a heart block. ? A problem with the heart's natural pacemaker (sinus node). ? Heart disease. ? A heart attack. ? Heart damage. ? A heart infection. ? A heart condition that is present at birth (congenital heart defect).  Certain medicines that treat heart conditions.  Certain conditions, such as hypothyroidism and obstructive sleep apnea.  Problems with the balance of chemicals and other substances, like potassium, in the blood.  What increases the risk? This condition is more likely to develop in adults who:  Are age 57 or older.  Have high blood pressure (hypertension), high cholesterol (hyperlipidemia), or diabetes.  Drink heavily, use tobacco or nicotine products, or use drugs.  Are stressed.  What are the signs or symptoms? Symptoms of this condition include:  Light-headedness.  Feeling faint or fainting.  Fatigue and weakness.  Shortness of breath.  Chest pain (angina).  Drowsiness.  Confusion.  Dizziness.  How is this diagnosed? This condition may be diagnosed based on:  Your symptoms.  Your medical history.  A physical exam.  During the exam, your health care provider will listen to your heartbeat and check your pulse. To confirm the diagnosis, your health care provider may order tests, such as:  Blood tests.  An electrocardiogram (ECG). This test records the heart's electrical activity. The test can show how fast your heart is beating and whether the heartbeat is steady.  A test in which you wear a portable device (event recorder or Holter monitor) to record your heart's electrical activity while you go about your day.  Anexercise test.  How is this  treated? Treatment for this condition depends on the cause of the condition and how severe your symptoms are. Treatment may involve:  Treatment of the underlying condition.  Changing your medicines or how much medicine you take.  Having a small, battery-operated device called a pacemaker implanted under the skin. When bradycardia occurs, this device can be used to increase your heart rate and help your heart to beat in a regular rhythm.  Follow these instructions at home: Lifestyle   Manage any health conditions that contribute to bradycardia as told by your health care provider.  Follow a heart-healthy diet. A nutrition specialist (dietitian) can help to educate you about healthy food options and changes.  Follow an exercise program that is approved by your health care provider.  Maintain a healthy weight.  Try to reduce or manage your stress, such as with yoga or meditation. If  you need help reducing stress, ask your health care provider.  Do not use use any products that contain nicotine or tobacco, such as cigarettes and e-cigarettes. If you need help quitting, ask your health care provider.  Do not use illegal drugs.  Limit alcohol intake to no more than 1 drink per day for nonpregnant women and 2 drinks per day for men. One drink equals 12 oz of beer, 5 oz of wine, or 1 oz of hard liquor. General instructions  Take over-the-counter and prescription medicines only as told by your health care provider.  Keep all follow-up visits as directed by your health care provider. This is important. How is this prevented? In some cases, bradycardia may be prevented by:  Treating underlying medical problems.  Stopping behaviors or medicines that can trigger the condition.  Contact a health care provider if:  You feel light-headed or dizzy.  You almost faint.  You feel weak or are easily fatigued during physical activity.  You experience confusion or have memory problems. Get  help right away if:  You faint.  You have an irregular heartbeat (palpitations).  You have chest pain.  You have trouble breathing. This information is not intended to replace advice given to you by your health care provider. Make sure you discuss any questions you have with your health care provider. Document Released: 06/10/2002 Document Revised: 05/16/2016 Document Reviewed: 03/09/2016 Elsevier Interactive Patient Education  2017 [employer].

## 2017-07-10 LAB — CBC WITH DIFFERENTIAL/PLATELET
BASOS ABS: 0 10*3/uL (ref 0.0–0.2)
Basos: 1 %
EOS (ABSOLUTE): 0.6 10*3/uL — ABNORMAL HIGH (ref 0.0–0.4)
EOS: 9 %
HEMOGLOBIN: 17.5 g/dL (ref 13.0–17.7)
Hematocrit: 49.2 % (ref 37.5–51.0)
IMMATURE GRANS (ABS): 0 10*3/uL (ref 0.0–0.1)
Immature Granulocytes: 0 %
LYMPHS: 39 %
Lymphocytes Absolute: 2.4 10*3/uL (ref 0.7–3.1)
MCH: 31.8 pg (ref 26.6–33.0)
MCHC: 35.6 g/dL (ref 31.5–35.7)
MCV: 90 fL (ref 79–97)
MONOCYTES: 10 %
Monocytes Absolute: 0.6 10*3/uL (ref 0.1–0.9)
Neutrophils Absolute: 2.5 10*3/uL (ref 1.4–7.0)
Neutrophils: 41 %
Platelets: 282 10*3/uL (ref 150–379)
RBC: 5.5 x10E6/uL (ref 4.14–5.80)
RDW: 13.6 % (ref 12.3–15.4)
WBC: 6.1 10*3/uL (ref 3.4–10.8)

## 2017-07-10 LAB — COMPREHENSIVE METABOLIC PANEL
A/G RATIO: 1.8 (ref 1.2–2.2)
ALT: 20 IU/L (ref 0–44)
AST: 26 IU/L (ref 0–40)
Albumin: 4.6 g/dL (ref 3.5–5.5)
Alkaline Phosphatase: 91 IU/L (ref 39–117)
BUN/Creatinine Ratio: 19 (ref 9–20)
BUN: 22 mg/dL — AB (ref 6–20)
Bilirubin Total: <0.2 mg/dL (ref 0.0–1.2)
CALCIUM: 9.6 mg/dL (ref 8.7–10.2)
CO2: 28 mmol/L (ref 20–29)
CREATININE: 1.18 mg/dL (ref 0.76–1.27)
Chloride: 100 mmol/L (ref 96–106)
GFR, EST AFRICAN AMERICAN: 102 mL/min/{1.73_m2} (ref >59)
GFR, EST NON AFRICAN AMERICAN: 88 mL/min/{1.73_m2} (ref >59)
GLUCOSE: 96 mg/dL (ref 65–99)
Globulin, Total: 2.5 g/dL (ref 1.5–4.5)
Potassium: 4.3 mmol/L (ref 3.5–5.2)
Sodium: 141 mmol/L (ref 134–144)
TOTAL PROTEIN: 7.1 g/dL (ref 6.0–8.5)

## 2017-07-10 LAB — TSH: TSH: 2.08 u[IU]/mL (ref 0.450–4.500)

## 2017-07-11 ENCOUNTER — Other Ambulatory Visit: Payer: Self-pay | Admitting: Family Medicine

## 2017-07-12 ENCOUNTER — Ambulatory Visit (HOSPITAL_COMMUNITY): Payer: Self-pay | Admitting: Psychiatry

## 2017-07-12 ENCOUNTER — Ambulatory Visit: Payer: Commercial Managed Care - PPO | Admitting: Family Medicine

## 2017-07-18 NOTE — Telephone Encounter (Signed)
Please advise 

## 2017-07-23 NOTE — Telephone Encounter (Addendum)
This should be managed by his psychiatrist.  Please call pt and see if he has decided to establish with Dr. Cheyenne Adasinger or Dr. Rene KocherEksir for med management so pharmacy can forward request there.

## 2017-07-26 NOTE — Telephone Encounter (Signed)
IC LMOVM for pt to advise which psych he is established with so we can forward pharmacy requests.

## 2017-07-30 ENCOUNTER — Encounter: Payer: Self-pay | Admitting: Family Medicine

## 2017-07-30 ENCOUNTER — Ambulatory Visit (INDEPENDENT_AMBULATORY_CARE_PROVIDER_SITE_OTHER): Payer: Commercial Managed Care - PPO | Admitting: Family Medicine

## 2017-07-30 VITALS — BP 130/88 | HR 54 | Temp 98.5°F | Resp 16 | Ht 69.75 in | Wt 164.2 lb

## 2017-07-30 DIAGNOSIS — F4010 Social phobia, unspecified: Secondary | ICD-10-CM | POA: Diagnosis not present

## 2017-07-30 MED ORDER — HYDROXYZINE HCL 50 MG PO TABS: 100.0000 mg | ORAL_TABLET | Freq: Four times a day (QID) | ORAL | 1 refills | Status: DC | PRN

## 2017-07-30 MED ORDER — DESVENLAFAXINE SUCCINATE ER 50 MG PO TB24: 50.0000 mg | ORAL_TABLET | Freq: Every day | ORAL | 1 refills | Status: DC

## 2017-07-30 MED ORDER — QUETIAPINE FUMARATE 50 MG PO TABS: 50.0000 mg | ORAL_TABLET | Freq: Every day | ORAL | 0 refills | Status: DC

## 2017-07-30 MED ORDER — BUSPIRONE HCL 30 MG PO TABS: 30.0000 mg | ORAL_TABLET | Freq: Two times a day (BID) | ORAL | 1 refills | Status: DC

## 2017-07-30 NOTE — Patient Instructions (Signed)
     IF you received an x-ray today, you will receive an invoice from [facility]. Please contact [facility] at [phone] with questions or concerns regarding your invoice.   IF you received labwork today, you will receive an invoice from [facility]. Please contact [facility] at [phone] with questions or concerns regarding your invoice.   Our billing staff will not be able to assist you with questions regarding bills from these companies.  You will be contacted with the lab results as soon as they are available. The fastest way to get your results is to activate your My Chart account. Instructions are located on the last page of this paperwork. If you have not heard from us regarding the results in 2 weeks, please contact this office.     

## 2017-07-30 NOTE — Progress Notes (Signed)
Subjective:    Patient ID: Justin May, male    DOB: 08/11/1996, 21 y.o.   MRN: [id number] Chief Complaint  Patient presents with  . Anxiety    and to discuss medication, referral to psychiatry     HPI He liked the PA but he felt like he was doing being a little more aggressive in attitude. He was restarted on pristiq 25 which he forgot he had already tried prior (from myself). But they wanted him to go to the group meetings - [facility] but pt felt he was unable to get transportation to the meetings that were $50/wk and doesn't have transportation due this DUI. Could only go once a week - then 2x/wk max.   He felt like the pristiq helps a little. Is still on buspar.  He is not drinking - states he has not had any EtOH since he was last here - states he is doing better on that. He is just feeling like going to bed - wants to go to sleep at 4-5 pm and then get up in the morning when time to go to work.  He took the seroquel the first few nights and wasn't working but then might have kicked in.   He is not taking this everynight or most nights and is sleeping well.   He is taking pristiq 50 that I had rx'd prior every morning.  Sxs get worsen as day progressing. Buspar 15mg  tid for about a month now - not noticing the disocialization as much but he does notice he is more angry w/o provocation.  He ran out of that recently as he was taking 4x/d. No side effects from it. Also doesn't feel like the gabapentin doesn't do anything - he is taking 200mg -400mg  tid but not sure that helps either.   Past Medical History:  Diagnosis Date  . Anxiety   . Asthma   . Seizures (HCC)    History reviewed. No pertinent surgical history. Current Outpatient Prescriptions on File Prior to Visit  Medication Sig Dispense Refill  . gabapentin (NEURONTIN) 100 MG capsule Take 2 capsules (200 mg total) by mouth 3 (three) times daily. For agitation 180 capsule 0   No current facility-administered medications on  file prior to visit.    No Known Allergies Family History  Problem Relation Age of Onset  . Hypertension Father    Social History   Social History  . Marital status: Single    Spouse name: N/A  . Number of children: N/A  . Years of education: N/A   Social History Main Topics  . Smoking status: Current Every Day Smoker    Packs/day: 1.00    Types: Cigarettes  . Smokeless tobacco: Never Used  . Alcohol use 0.6 oz/week    1 Cans of beer per week     Comment: pt states he drinks 3-4 days out of the week  . Drug use: No  . Sexual activity: Not Currently   Other Topics Concern  . None   Social History Narrative  . None   [facility] 07/30/2017 07/09/2017 05/17/2017 05/17/2017 04/26/2017  Decreased Interest 1 1 1 1  0  Down, Depressed, Hopeless 1 2 - 1 0  PHQ - 2 Score 2 3 1 2  0  Altered sleeping 2 3 - 0 -  Tired, decreased energy 2 0 - 0 -  Change in appetite 2 1 - 0 -  Feeling bad or failure about yourself  2 1 -  2 -  Trouble concentrating 3 2 - 2 -  Moving slowly or fidgety/restless 3 2 - 1 -  Suicidal thoughts 0 0 - 0 -  PHQ-9 Score 16 12 - 7 -  Difficult doing work/chores Very difficult - - Somewhat difficult -     Review of Systems  Constitutional: Positive for fatigue. Negative for activity change, appetite change, diaphoresis and unexpected weight change.  Respiratory: Negative for chest tightness and shortness of breath.   Cardiovascular: Negative for chest pain and palpitations.  Gastrointestinal: Negative for nausea and vomiting.  Neurological: Negative for dizziness, tremors, syncope and light-headedness.  Psychiatric/Behavioral: Positive for behavioral problems and decreased concentration. Negative for agitation, confusion, dysphoric mood, hallucinations, self-injury, sleep disturbance and suicidal ideas. The patient is nervous/anxious. The patient is not hyperactive.        Objective:   Physical Exam  Constitutional: He is oriented to  person, place, and time. He appears well-developed and well-nourished. No distress.  HENT:  Head: Normocephalic and atraumatic.  Eyes: Pupils are equal, round, and reactive to light. Conjunctivae are normal. No scleral icterus.  Neck: Normal range of motion. Neck supple. No thyromegaly present.  Cardiovascular: Normal rate, regular rhythm, normal heart sounds and intact distal pulses.   Pulmonary/Chest: Effort normal and breath sounds normal. No respiratory distress.  Musculoskeletal: He exhibits no edema.  Lymphadenopathy:    He has no cervical adenopathy.  Neurological: He is alert and oriented to person, place, and time.  Skin: Skin is warm and dry. He is not diaphoretic.  Psychiatric: He has a normal mood and affect. His behavior is normal.      BP 130/88   Pulse (!) 54   Temp 98.5 F (36.9 C)   Resp 16   Ht 5' 9.75" (1.772 m)   Wt 164 lb 3.2 oz (74.5 kg)   SpO2 100%   BMI 23.73 kg/m      Assessment & Plan:   1. Social anxiety disorder     Orders Placed This Encounter  Procedures  . Ambulatory referral to Psychiatry    Referral Priority:   Urgent    Referral Type:   Psychiatric    Referral Reason:   Specialty Services Required    Requested Specialty:   Psychiatry    Number of Visits Requested:   1    Meds ordered this encounter  Medications  . desvenlafaxine (PRISTIQ) 50 MG 24 hr tablet    Sig: Take 1 tablet (50 mg total) by mouth daily.    Dispense:  30 tablet    Refill:  1  . hydrOXYzine (ATARAX/VISTARIL) 50 MG tablet    Sig: Take 2 tablets (100 mg total) by mouth every 6 (six) hours as needed. For anxiety    Dispense:  120 tablet    Refill:  1  . busPIRone (BUSPAR) 30 MG tablet    Sig: Take 1 tablet (30 mg total) by mouth 2 (two) times daily. For anxiety    Dispense:  60 tablet    Refill:  1  . QUEtiapine (SEROQUEL) 50 MG tablet    Sig: Take 1 tablet (50 mg total) by mouth at bedtime.    Dispense:  30 tablet    Refill:  0    [name], M.D.    Primary Care at [facility] [address] [place], [place] [address] [phone] phone [phone] fax  08/01/17 10:40 PM

## 2017-08-09 ENCOUNTER — Telehealth: Payer: Self-pay | Admitting: Family Medicine

## 2017-08-09 NOTE — Telephone Encounter (Signed)
Copied from CRM [phone]. Topic: Quick Communication - See Telephone Encounter >> Aug 09, 2017  2:30 PM Cipriano BunkerLambe, Annette S wrote: CRM for notification. See Telephone encounter for:  He wants to see if Dr. Clelia CroftShaw would prescribe 5 or 6 tablets of Librium (5 or 10 mg) for him. He said he is getting some anxiety at work and just doesn't want to loose it. He said he does not need a lot just at times. Please call and let him know.  [facility] [address].   08/09/17.

## 2017-08-12 ENCOUNTER — Other Ambulatory Visit: Payer: Self-pay | Admitting: Family Medicine

## 2017-08-13 ENCOUNTER — Ambulatory Visit: Payer: Commercial Managed Care - PPO | Admitting: Family Medicine

## 2017-08-14 MED ORDER — CHLORDIAZEPOXIDE HCL 10 MG PO CAPS: 10.0000 mg | ORAL_CAPSULE | Freq: Every day | ORAL | 0 refills | Status: DC | PRN

## 2017-08-14 MED ORDER — GABAPENTIN 400 MG PO CAPS: 400.0000 mg | ORAL_CAPSULE | Freq: Three times a day (TID) | ORAL | 0 refills | Status: DC

## 2017-08-14 NOTE — Addendum Note (Signed)
Addended by: Sherren MochaSHAW, EVA N on: 08/14/2017 11:17 AM   Modules accepted: Orders

## 2017-08-14 NOTE — Telephone Encounter (Signed)
Please call or fax into [facility] [address].  Please also let pt know that his rx for gabapentin was [facility] (I think that was where the request was from.)  If pt prefers, ok to cancel either rx at one of the pharmacies and call into the other one so that he only has to go to one place to pick up his meds.  Needs to keep OV w/ me on 11/19 for any refills.

## 2017-08-14 NOTE — Telephone Encounter (Signed)
Pt called back asking about RX. I informed him it would be sent over to pharmacy and it is advised he keep his OV appt on11/19.

## 2017-08-15 ENCOUNTER — Telehealth: Payer: Self-pay

## 2017-08-15 NOTE — Telephone Encounter (Signed)
Pt is calling back stating his pharmacy still has not received a fax for this medication. States it has been several days since he was told the RX would be sent over.

## 2017-08-15 NOTE — Telephone Encounter (Signed)
Was it a print or was it sent over electronically? Please contact pt.

## 2017-08-15 NOTE — Telephone Encounter (Signed)
Copied from CRM #[id number]. Topic: Quick Communication - See Telephone Encounter °>> Aug 09, 2017  2:30 PM Lambe, Annette S wrote: °CRM for notification. See Telephone encounter for:  He wants to see if Dr. Shaw would prescribe 5 or 6 tablets of Librium (5 or 10 mg) for him. He said he is getting some anxiety at work and just doesn't want to loose it. He said he does not need a lot °just at times. Please call and let him know.  °[facility] [address].  ° °08/09/17. °

## 2017-08-16 ENCOUNTER — Telehealth: Payer: Self-pay | Admitting: Family Medicine

## 2017-08-16 NOTE — Telephone Encounter (Signed)
Copied from CRM [phone]. Topic: Quick Communication - See Telephone Encounter >> Aug 16, 2017 10:43 AM Cipriano BunkerLambe, Annette S wrote: CRM for notification. See Telephone encounter for:  Patient is wanting to leave message for Dr Clelia CroftShaw , [facility]. He is out of Gabapentin , can patient get prescription. Patient has appt 11/19 at 2:40 with Dr. Clelia CroftShaw [facility] [address] 08/16/17.

## 2017-08-16 NOTE — Telephone Encounter (Signed)
Librium was called in on 11/14. No answer and VM not set up.

## 2017-08-16 NOTE — Telephone Encounter (Signed)
Informed patient prescription was called into [facility] on 08-14-2017.

## 2017-08-20 ENCOUNTER — Encounter: Payer: Self-pay | Admitting: Family Medicine

## 2017-08-20 ENCOUNTER — Other Ambulatory Visit: Payer: Self-pay

## 2017-08-20 ENCOUNTER — Ambulatory Visit (INDEPENDENT_AMBULATORY_CARE_PROVIDER_SITE_OTHER): Payer: Commercial Managed Care - PPO | Admitting: Family Medicine

## 2017-08-20 VITALS — BP 122/86 | HR 66 | Temp 98.9°F | Resp 16 | Ht 69.75 in | Wt 169.6 lb

## 2017-08-20 DIAGNOSIS — F411 Generalized anxiety disorder: Secondary | ICD-10-CM | POA: Diagnosis not present

## 2017-08-20 DIAGNOSIS — F1310 Sedative, hypnotic or anxiolytic abuse, uncomplicated: Secondary | ICD-10-CM

## 2017-08-20 DIAGNOSIS — F4010 Social phobia, unspecified: Secondary | ICD-10-CM

## 2017-08-20 MED ORDER — CHLORDIAZEPOXIDE HCL 10 MG PO CAPS: 10.0000 mg | ORAL_CAPSULE | Freq: Every day | ORAL | 0 refills | Status: DC | PRN

## 2017-08-20 MED ORDER — GABAPENTIN 400 MG PO CAPS: 400.0000 mg | ORAL_CAPSULE | Freq: Four times a day (QID) | ORAL | 0 refills | Status: DC

## 2017-08-20 NOTE — Patient Instructions (Addendum)
Decrease buspar from 30mg  twice a day to 20mg  twice a day for 3 days, then down to 10mg  twice a day for 3 days, then 10mg  once a day for 3d, then stop. Stop the seroquel. If you aren't sleeping, restart it.   IF you received an x-ray today, you will receive an invoice from [facility]. Please contact [facility] at [phone] with questions or concerns regarding your invoice.   IF you received labwork today, you will receive an invoice from [facility]. Please contact [facility] at [phone] with questions or concerns regarding your invoice.   Our billing staff will not be able to assist you with questions regarding bills from these companies.  You will be contacted with the lab results as soon as they are available. The fastest way to get your results is to activate your My Chart account. Instructions are located on the last page of this paperwork. If you have not heard from us regarding the results in 2 weeks, please contact this office.     HIGHLY recommend [facility] -all their providers are great - recommend Tamela OddiJo Hughes or Ellis SavageLisa Poulos for medication management.  Hal NeerRebecca Austin is GREAT for therapy - I think you would get along really really well with any of these providers - that you would really like them.  [address],  [place], [place] [address]  Phone # [phone]

## 2017-08-20 NOTE — Progress Notes (Signed)
Subjective:    Patient ID: Justin May H Champa, male    DOB: 01/10/1996, 21 y.o.   MRN: [id number] Chief Complaint  Patient presents with  . Follow-up    anxiety , wants RX for librium    HPI Justin May is here for a 3 weeks follow up on his anxiety. We continued the Pristiq 50 and seroquel 50 qhs, changed his buspar from 15 tid-qid to 30 bid, continued gabapentin 400mg  tid and started prn hydroxyzine.    5d ago, we called in librium 10mg  prn as he was having some anxiety at work and "didn't want to loose it."  When he gets really anxious he gets a weird tick when he is under exceptional anxeity where his head tilts back.  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  He often feels weird like everyone is looking at him.  He got to the point where he felt like he was   He wants to get off the buspar and the seroquel as he is sleeping well and feels like he doesn't need it.  He is more anxious overall so feels like the buspar and the pristiq isn't working.  He wants to try prozac again. He started pristiq around 10/9.  Felt like the buspar made him different - like a zombie, not joking around as much.  So now feels like it just not doing anything.   He is not drinking anymore since his hospitalization = he wants to know if anyting would work if he does it right.   The hydroxyzine 100mg  didn't do anything for him - made him just as anxious.    He could not get an appointment with [facility] until February.  He was not on any medication about a year ago and he did have really bad anxiety - couldn't even force eye contact at that point.   Past Medical History:  Diagnosis Date  . Anxiety   . Asthma   . Seizures (HCC)    History reviewed. No pertinent surgical history. Current Outpatient Medications on File Prior to Visit  Medication Sig Dispense Refill  . desvenlafaxine (PRISTIQ) 50 MG 24 hr tablet Take 1 tablet (50 mg total) by mouth daily. 30 tablet 1   No current  facility-administered medications on file prior to visit.    No Known Allergies Family History  Problem Relation Age of Onset  . Hypertension Father    Social History   Socioeconomic History  . Marital status: Single    Spouse name: None  . Number of children: None  . Years of education: None  . Highest education level: None  Social Needs  . Financial resource strain: None  . Food insecurity - worry: None  . Food insecurity - inability: None  . Transportation needs - medical: None  . Transportation needs - non-medical: None  Occupational History  . None  Tobacco Use  . Smoking status: Current Every Day Smoker    Packs/day: 1.00    Types: Cigarettes  . Smokeless tobacco: Never Used  Substance and Sexual Activity  . Alcohol use: Yes    Alcohol/week: 0.6 oz    Types: 1 Cans of beer per week    Comment: pt states he drinks 3-4 days out of the week  . Drug use: No  . Sexual activity: Not Currently  Other Topics Concern  . None  Social History Narrative  . None   Depression screen [facility] 2/9 07/30/2017 07/09/2017 05/17/2017  05/17/2017 04/26/2017  Decreased Interest 1 1 1 1  0  Down, Depressed, Hopeless 1 2 - 1 0  PHQ - 2 Score 2 3 1 2  0  Altered sleeping 2 3 - 0 -  Tired, decreased energy 2 0 - 0 -  Change in appetite 2 1 - 0 -  Feeling bad or failure about yourself  2 1 - 2 -  Trouble concentrating 3 2 - 2 -  Moving slowly or fidgety/restless 3 2 - 1 -  Suicidal thoughts 0 0 - 0 -  PHQ-9 Score 16 12 - 7 -  Difficult doing work/chores Very difficult - - Somewhat difficult -     Review of Systems See hpi    Objective:   Physical Exam  Constitutional: He is oriented to person, place, and time. He appears well-developed and well-nourished. No distress.  HENT:  Head: Normocephalic and atraumatic.  Eyes: Conjunctivae are normal. Pupils are equal, round, and reactive to light. No scleral icterus.  Neck: Normal range of motion. Neck supple. No thyromegaly present.    Cardiovascular: Normal rate, regular rhythm, normal heart sounds and intact distal pulses.  Pulmonary/Chest: Effort normal and breath sounds normal. No respiratory distress.  Musculoskeletal: He exhibits no edema.  Lymphadenopathy:    He has no cervical adenopathy.  Neurological: He is alert and oriented to person, place, and time.  Skin: Skin is warm and dry. He is not diaphoretic.  Psychiatric: He has a normal mood and affect. His behavior is normal.         BP 122/86   Pulse 66   Temp 98.9 F (37.2 C)   Resp 16   Ht 5' 9.75" (1.772 m)   Wt 169 lb 9.6 oz (76.9 kg)   SpO2 100%   BMI 24.51 kg/m   Assessment & Plan:   1. Social anxiety disorder   2. Anxiety state   3. Mild benzodiazepine use disorder (HCC)       Meds ordered this encounter  Medications  . chlordiazePOXIDE (LIBRIUM) 10 MG capsule    Sig: Take 1 capsule (10 mg total) daily as needed by mouth for anxiety (SEVERE). **Needs office visit for any refills**    Dispense:  30 capsule    Refill:  0    Please call or fax into [facility] [phone]  . gabapentin (NEURONTIN) 400 MG capsule    Sig: Take 1 capsule (400 mg total) 4 (four) times daily by mouth. For agitation    Dispense:  120 capsule    Refill:  0      Norberto SorensonEva Jalyssa Fleisher, M.D.  Primary Care at [facility]  [place] [address] [place], [place] [address] [phone] phone [phone] fax  08/23/17 9:33 AM

## 2017-08-31 ENCOUNTER — Ambulatory Visit: Payer: Self-pay | Admitting: *Deleted

## 2017-08-31 ENCOUNTER — Encounter (HOSPITAL_COMMUNITY): Payer: Self-pay

## 2017-08-31 ENCOUNTER — Emergency Department (HOSPITAL_COMMUNITY): Payer: Commercial Managed Care - PPO

## 2017-08-31 ENCOUNTER — Emergency Department (HOSPITAL_COMMUNITY)
Admission: EM | Admit: 2017-08-31 | Discharge: 2017-08-31 | Disposition: A | Discharge status: Home / Self Care | Payer: Commercial Managed Care - PPO | Attending: Emergency Medicine | Admitting: Emergency Medicine

## 2017-08-31 ENCOUNTER — Other Ambulatory Visit: Payer: Self-pay

## 2017-08-31 DIAGNOSIS — Y939 Activity, unspecified: Secondary | ICD-10-CM | POA: Diagnosis not present

## 2017-08-31 DIAGNOSIS — J45909 Unspecified asthma, uncomplicated: Secondary | ICD-10-CM | POA: Diagnosis not present

## 2017-08-31 DIAGNOSIS — F17210 Nicotine dependence, cigarettes, uncomplicated: Secondary | ICD-10-CM | POA: Diagnosis not present

## 2017-08-31 DIAGNOSIS — S61411A Laceration without foreign body of right hand, initial encounter: Secondary | ICD-10-CM | POA: Insufficient documentation

## 2017-08-31 DIAGNOSIS — Z23 Encounter for immunization: Secondary | ICD-10-CM | POA: Diagnosis not present

## 2017-08-31 DIAGNOSIS — Y929 Unspecified place or not applicable: Secondary | ICD-10-CM | POA: Diagnosis not present

## 2017-08-31 DIAGNOSIS — W269XXA Contact with unspecified sharp object(s), initial encounter: Secondary | ICD-10-CM | POA: Diagnosis not present

## 2017-08-31 DIAGNOSIS — Y999 Unspecified external cause status: Secondary | ICD-10-CM | POA: Insufficient documentation

## 2017-08-31 DIAGNOSIS — Z79899 Other long term (current) drug therapy: Secondary | ICD-10-CM | POA: Diagnosis not present

## 2017-08-31 MED ORDER — BACITRACIN ZINC 500 UNIT/GM EX OINT: 1.0000 "application " | TOPICAL_OINTMENT | Freq: Two times a day (BID) | CUTANEOUS | 0 refills | Status: DC

## 2017-08-31 MED ORDER — IBUPROFEN 200 MG PO TABS
600.0000 mg | ORAL_TABLET | Freq: Once | ORAL | Status: AC
  Administered 2017-08-31: 600 mg via ORAL
  Filled 2017-08-31: qty 3

## 2017-08-31 MED ORDER — ACETAMINOPHEN 500 MG PO TABS
500.0000 mg | ORAL_TABLET | Freq: Once | ORAL | Status: AC
  Administered 2017-08-31: 500 mg via ORAL
  Filled 2017-08-31: qty 1

## 2017-08-31 MED ORDER — TETANUS-DIPHTH-ACELL PERTUSSIS 5-2.5-18.5 LF-MCG/0.5 IM SUSP
0.5000 mL | Freq: Once | INTRAMUSCULAR | Status: AC
  Administered 2017-08-31: 0.5 mL via INTRAMUSCULAR
  Filled 2017-08-31: qty 0.5

## 2017-08-31 MED ORDER — HYDROCODONE-ACETAMINOPHEN 5-325 MG PO TABS: 1.0000 | ORAL_TABLET | Freq: Four times a day (QID) | ORAL | 0 refills | Status: DC | PRN

## 2017-08-31 NOTE — ED Notes (Signed)
Bed: WLPT1 Expected date:  Expected time:  Means of arrival:  Comments: 
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
## 2017-08-31 NOTE — ED Notes (Signed)
Patient transported to X-ray 
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
## 2017-08-31 NOTE — Discharge Instructions (Signed)
It was my pleasure taking care of you today!   Keep wound clean and dry. Apply topical antibiotic ointment to the hand twice daily. Keep covered.   Follow up with your primary care doctor or return to ER for redness or warmth around the wound which could be a sign of infection. You may also return to ER for new or worsening symptoms, any additional concerns.
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
## 2017-08-31 NOTE — ED Notes (Signed)
Bed: WA13 Expected date:  Expected time:  Means of arrival:  Comments: Triage 1 

## 2017-08-31 NOTE — ED Notes (Signed)
ED Provider at bedside. 

## 2017-08-31 NOTE — ED Notes (Signed)
Bed: [facility]ALE Expected date:  Expected time:  Means of arrival:  Comments: 

## 2017-09-07 ENCOUNTER — Other Ambulatory Visit: Payer: Self-pay

## 2017-09-07 ENCOUNTER — Encounter: Payer: Self-pay | Admitting: Family Medicine

## 2017-09-07 ENCOUNTER — Telehealth: Payer: Self-pay | Admitting: Family Medicine

## 2017-09-07 ENCOUNTER — Ambulatory Visit (INDEPENDENT_AMBULATORY_CARE_PROVIDER_SITE_OTHER): Payer: Commercial Managed Care - PPO | Admitting: Family Medicine

## 2017-09-07 VITALS — BP 122/78 | HR 61 | Temp 98.6°F | Resp 16 | Ht 71.0 in | Wt 167.4 lb

## 2017-09-07 DIAGNOSIS — Z79899 Other long term (current) drug therapy: Secondary | ICD-10-CM | POA: Diagnosis not present

## 2017-09-07 DIAGNOSIS — Z5181 Encounter for therapeutic drug level monitoring: Secondary | ICD-10-CM

## 2017-09-07 DIAGNOSIS — F4010 Social phobia, unspecified: Secondary | ICD-10-CM

## 2017-09-07 DIAGNOSIS — S6991XA Unspecified injury of right wrist, hand and finger(s), initial encounter: Secondary | ICD-10-CM

## 2017-09-07 DIAGNOSIS — F411 Generalized anxiety disorder: Secondary | ICD-10-CM | POA: Diagnosis not present

## 2017-09-07 DIAGNOSIS — F1310 Sedative, hypnotic or anxiolytic abuse, uncomplicated: Secondary | ICD-10-CM

## 2017-09-07 DIAGNOSIS — G4700 Insomnia, unspecified: Secondary | ICD-10-CM

## 2017-09-07 MED ORDER — QUETIAPINE FUMARATE 50 MG PO TABS: 50.0000 mg | ORAL_TABLET | Freq: Two times a day (BID) | ORAL | 0 refills | Status: DC | PRN

## 2017-09-07 MED ORDER — ALPRAZOLAM 0.5 MG PO TABS: 0.5000 mg | ORAL_TABLET | Freq: Every day | ORAL | 0 refills | Status: DC | PRN

## 2017-09-07 MED ORDER — FLUOXETINE HCL 20 MG PO TABS: 20.0000 mg | ORAL_TABLET | Freq: Every day | ORAL | 0 refills | Status: DC
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
## 2017-09-07 NOTE — Telephone Encounter (Signed)
Copied from CRM [phone]. Topic: Inquiry >> Sep 07, 2017 12:03 PM Stephannie LiSimmons, Lanny Lipkin L, NT wrote: Reason for CRM: Patient wants to know if he should wean off of antidepressant  or stop altogether

## 2017-09-07 NOTE — Patient Instructions (Addendum)
If you want to continue on the alprazolam - no more than 1 tab a day - you need to bring me your current bottle of librium which should have at least 12 pills in it so that I know you are not going to be combining both.  IF you received an x-ray today, you will receive an invoice from [facility]. Please contact [facility] at [phone] with questions or concerns regarding your invoice.   IF you received labwork today, you will receive an invoice from [facility]. Please contact [facility] at [phone] with questions or concerns regarding your invoice.   Our billing staff will not be able to assist you with questions regarding bills from these companies.  You will be contacted with the lab results as soon as they are available. The fastest way to get your results is to activate your My Chart account. Instructions are located on the last page of this paperwork. If you have not heard from us regarding the results in 2 weeks, please contact this office.      Wound Care, Adult Taking care of your wound properly can help to prevent pain and infection. It can also help your wound to heal more quickly. How is this treated? Wound care  Follow instructions from your health care provider about how to take care of your wound. Make sure you: ? Wash your hands with soap and water before you change the bandage (dressing). If soap and water are not available, use hand sanitizer. ? Change your dressing as told by your health care provider. ? Leave stitches (sutures), skin glue, or adhesive strips in place. These skin closures may need to stay in place for 2 weeks or longer. If adhesive strip edges start to loosen and curl up, you may trim the loose edges. Do not remove adhesive strips completely unless your health care provider tells you to do that.  Check your wound area every day for signs of infection. Check for: ? More redness, swelling, or pain. ? More fluid or  blood. ? Warmth. ? Pus or a bad smell.  Ask your health care provider if you should clean the wound with mild soap and water. Doing this may include: ? Using a clean towel to pat the wound dry after cleaning it. Do not rub or scrub the wound. ? Applying a cream or ointment. Do this only as told by your health care provider. ? Covering the incision with a clean dressing.  Ask your health care provider when you can leave the wound uncovered. Medicines   If you were prescribed an antibiotic medicine, cream, or ointment, take or use the antibiotic as told by your health care provider. Do not stop taking or using the antibiotic even if your condition improves.  Take over-the-counter and prescription medicines only as told by your health care provider. If you were prescribed pain medicine, take it at least 30 minutes before doing any wound care or as told by your health care provider. General instructions  Return to your normal activities as told by your health care provider. Ask your health care provider what activities are safe.  Do not scratch or pick at the wound.  Keep all follow-up visits as told by your health care provider. This is important.  Eat a diet that includes protein, vitamin A, vitamin C, and other nutrient-rich foods. These help the wound heal: ? Protein-rich foods include meat, dairy, beans, nuts, and other sources. ? Vitamin A-rich foods include carrots and dark  green, leafy vegetables. ? Vitamin C-rich foods include citrus, tomatoes, and other fruits and vegetables. ? Nutrient-rich foods have protein, carbohydrates, fat, vitamins, or minerals. Eat a variety of healthy foods including vegetables, fruits, and whole grains. Contact a health care provider if:  You received a tetanus shot and you have swelling, severe pain, redness, or bleeding at the injection site.  Your pain is not controlled with medicine.  You have more redness, swelling, or pain around the  wound.  You have more fluid or blood coming from the wound.  Your wound feels warm to the touch.  You have pus or a bad smell coming from the wound.  You have a fever or chills.  You are nauseous or you vomit.  You are dizzy. Get help right away if:  You have a red streak going away from your wound.  The edges of the wound open up and separate.  Your wound is bleeding and the bleeding does not stop with gentle pressure.  You have a rash.  You faint.  You have trouble breathing. This information is not intended to replace advice given to you by your health care provider. Make sure you discuss any questions you have with your health care provider. Document Released: 06/27/2008 Document Revised: 05/17/2016 Document Reviewed: 04/04/2016 Elsevier Interactive Patient Education  2017 Elsevier Inc.  How to Change Your Dressing A dressing is a material that is placed in and over wounds. A dressing helps your wound to heal by protecting it from:  Bacteria.  Worse injury.  Being too dry or too wet.  What are the risks? The sticky (adhesive) tape that is used with a dressing may make your skin sore or irritated, or it may cause a rash. These are the most common problems. However, more serious problems can develop, such as:  Bleeding.  Infection.  How to change your dressing Getting Ready to Change Your Dressing   Take a shower before you do the first dressing change of the day. If your doctor does not want your wound to get wet and your dressing is not waterproof, you may need to put plastic leak-proof sealing wrap on your dressing to protect it.  If needed, take pain medicine as told by your doctor 30 minutes before you change your dressing.  Set up a clean station for wound care. You will need: ? A plastic trash bag that is open and ready to use. ? Hand sanitizer. ? Wound cleanser or salt-water solution (saline) as told by your doctor. ? New dressing material or  bandages. Make sure to open the dressing package so the dressing stays on the inside of the package. You may also need these supplies in your clean station:  A box of vinyl gloves.  Tape.  Skin protectant. This may be a wipe, film, or spray.  Clean or germ-free (sterile) scissors.  A cotton-tipped applicator.  Taking Off Your Old Dressing  Wash your hands with soap and water. Dry your hands with a clean towel. If you cannot use soap and water, use hand sanitizer.  If you are using gloves, put on the gloves before you take off the dressing.  Gently take off any adhesive or tape by pulling it off in the direction of your hair growth. Only touch the outside edges of the dressing.  Take off the dressing. If the dressing sticks to your skin, wet the dressing with a germ-free salt-water solution. This helps it come off more easily.  Take off  any gauze or packing in your wound.  Throw the old dressing supplies into the ready trash bag.  Take off your gloves. To take off each glove, grab the cuff with your other hand and turn the glove inside out. Put the gloves in the trash right away.  Wash your hands with soap and water. Dry your hands with a clean towel. If you cannot use soap and water, use hand sanitizer. Cleaning Your Wound  Follow instructions from your doctor about how to clean your wound. This may include using a salt-water solution or recommended wound cleanser.  Do not use over-the-counter medicated or antiseptic creams, sprays, liquids, or dressings unless your doctor tells you to do that.  Use a clean gauze pad to clean the area fully with the salt-water solution or wound cleanser that your doctor recommends.  Throw the gauze pad into the trash bag.  Wash your hands with soap and water. Dry your hands with a clean towel. If you cannot use soap and water, use hand sanitizer. Putting on the Dressing  If your doctor recommended a skin protectant, put it on the skin  around the wound.  Cover the wound with the recommended dressing, such as a nonstick gauze or bandage. Make sure to touch only the outside edges of the dressing. Do not touch the inside of the dressing.  Attach the dressing so all sides stay in place. You may do this with the attached medical adhesive, roll gauze, or tape. If you use tape, do not wrap the tape all the way around your arm or leg.  Take off your gloves. Put them in the trash bag with the old dressing. Tie the bag shut and throw it away.  Wash your hands with soap and water. Dry your hands with a clean towel. If you cannot use soap and water, use hand sanitizer. Get help if:   You have new pain.  You have irritation, a rash, or itching around the wound or dressing.  Changing your dressing is painful.  Changing your dressing causes a lot of bleeding. Get help right away if:  You have very bad pain.  You have signs of infection, such as: ? More redness, swelling, or pain. ? More fluid or blood. ? Warmth. ? Pus or a bad smell. ? Red streaks leading from wound. ? A fever. This information is not intended to replace advice given to you by your health care provider. Make sure you discuss any questions you have with your health care provider. Document Released: 12/15/2008 Document Revised: 02/24/2016 Document Reviewed: 06/24/2015 Elsevier Interactive Patient Education  [employer].

## 2017-09-13 ENCOUNTER — Other Ambulatory Visit: Payer: Self-pay | Admitting: Family Medicine

## 2017-09-13 NOTE — Telephone Encounter (Signed)
Please advise. Should she stop pristiq or taper off?

## 2017-09-14 LAB — TOXASSURE SELECT 13 (MW), URINE

## 2017-09-14 NOTE — Telephone Encounter (Signed)
Spoke with pt.  He did stop the Pristiq.  Has not started the Prozac due to being snowed in with the weather. He states he will start the Prozac.   Pt verbalized understanding.

## 2017-09-14 NOTE — Telephone Encounter (Signed)
Initial message was from a week ago so please recheck w/ pt - I imagine he has already made his own decision by now and likely this message is NA. . .  But no - since he has not really ever noticed any effect from the antidepressents and seems to be VERY med resistant (meaning they just don't have near as much effect on him as they do on most people), we were just going to try a quick swap - stop pristiq, start prozac. In most patients we would do a cross taper with decreasing pristiq to 1/2 dose for a week, then starting (adding in) 1/2 dose of the prozac for a week, then (now has been on 1/2 tab of pristiq qd x 2 wks) stopping pristiq and going up to 1 whole tab on prozac so if he is having any side effects from dose/med change, he is welcome to do the 2 week crosstaper rather than the 1 day med swap.

## 2017-09-16 ENCOUNTER — Other Ambulatory Visit: Payer: Self-pay | Admitting: Family Medicine

## 2017-09-17 NOTE — Telephone Encounter (Signed)
Spoke with pt regarding refill request for pristiq; pt states that he doesn't need that because he is taking something else; will deny refill request.

## 2017-09-20 ENCOUNTER — Ambulatory Visit: Payer: Commercial Managed Care - PPO | Admitting: Family Medicine

## 2017-09-26 ENCOUNTER — Telehealth: Payer: Self-pay | Admitting: Family Medicine

## 2017-09-26 NOTE — Telephone Encounter (Signed)
Copied from CRM [id number]. Topic: Quick Communication - See Telephone Encounter >> Sep 26, 2017 10:55 AM Louie BunPalacios Medina, Rosey Batheresa D wrote: CRM for notification. See Telephone encounter for: 09/26/17. Patient called and said that the medication ALPRAZolam (XANAX) 0.5 MG tablet did not work the first day he took it but the second and third day it did work. He wanted to let the provider know and he would like to talk to provider about this. Please call patient back,thanks.

## 2017-09-28 NOTE — Telephone Encounter (Signed)
Error

## 2017-09-28 NOTE — Telephone Encounter (Signed)
Message sent to Dr. Shaw 

## 2017-09-30 NOTE — Telephone Encounter (Signed)
Will discuss at his visit on 10/04/2016

## 2017-10-04 ENCOUNTER — Ambulatory Visit: Payer: Commercial Managed Care - PPO | Admitting: Family Medicine

## 2017-10-08 ENCOUNTER — Ambulatory Visit (INDEPENDENT_AMBULATORY_CARE_PROVIDER_SITE_OTHER): Payer: Commercial Managed Care - PPO | Admitting: Family Medicine

## 2017-10-08 ENCOUNTER — Encounter: Payer: Self-pay | Admitting: Family Medicine

## 2017-10-08 ENCOUNTER — Other Ambulatory Visit: Payer: Self-pay

## 2017-10-08 VITALS — BP 108/74 | HR 50 | Temp 98.7°F | Resp 16 | Ht 71.0 in | Wt 173.6 lb

## 2017-10-08 DIAGNOSIS — F1310 Sedative, hypnotic or anxiolytic abuse, uncomplicated: Secondary | ICD-10-CM

## 2017-10-08 DIAGNOSIS — F411 Generalized anxiety disorder: Secondary | ICD-10-CM

## 2017-10-08 DIAGNOSIS — F4010 Social phobia, unspecified: Secondary | ICD-10-CM | POA: Diagnosis not present

## 2017-10-08 DIAGNOSIS — F1094 Alcohol use, unspecified with alcohol-induced mood disorder: Secondary | ICD-10-CM

## 2017-10-08 MED ORDER — LAMOTRIGINE 25 MG PO TABS: ORAL_TABLET | ORAL | 0 refills | Status: DC

## 2017-10-08 MED ORDER — GABAPENTIN 400 MG PO CAPS: ORAL_CAPSULE | ORAL | 0 refills | Status: DC

## 2017-10-08 MED ORDER — ALPRAZOLAM 0.5 MG PO TABS: 0.5000 mg | ORAL_TABLET | Freq: Every day | ORAL | 0 refills | Status: DC

## 2017-10-08 NOTE — Patient Instructions (Signed)
     IF you received an x-ray today, you will receive an invoice from [facility]. Please contact [facility] at [phone] with questions or concerns regarding your invoice.   IF you received labwork today, you will receive an invoice from [facility]. Please contact [facility] at [phone] with questions or concerns regarding your invoice.   Our billing staff will not be able to assist you with questions regarding bills from these companies.  You will be contacted with the lab results as soon as they are available. The fastest way to get your results is to activate your My Chart account. Instructions are located on the last page of this paperwork. If you have not heard from us regarding the results in 2 weeks, please contact this office.     

## 2017-10-08 NOTE — Progress Notes (Signed)
Subjective:  By signing my name below, I, Justin May, attest that this documentation has been prepared under the direction and in the presence of Justin Sorenson, MD Electronically Signed: Charline Bills, ED Scribe 10/08/2017 at 8:56 AM.   Patient ID: Justin May, male    DOB: 01-27-1996, 21 y.o.   MRN: [id number]  Chief Complaint  Patient presents with  . Follow-up    1 month follow up    HPI Justin May is a 22 y.o. male who presents to Primary Care at [facility] for f/u. Pt filled librium 10 mg 11/19, Hydrocodone 5 #3 11/30, Alprazolam  0.5 #3 12/7. Started pt on Prozac 20 mg last visit 1 month prior. Pt has an appointment at [facility] scheduled in February but doesn't think he'll be able to afford to see them regularly. He has been off Buspar and Seroquel for ~6 wks. Encouraged pt to continue Seroquel prn for sleep.   Pt states a family member told him Prozac made them feel "weird" so he did not start medication. He did try Xanax which he states made him feel calm for up to 3 days. States he has been off of all of his medications, including Seroquel, and has been sleeping fine. He does report that he has a significant amount of stressors around him but he has been handling his stress better. 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 He has tried Lexapro, Buspar, Zoloft, Pristiq in the past but not Lamictal, Depakote, tegretol.   Past Medical History:  Diagnosis Date  . Anxiety   . Asthma   . Seizures (HCC)    Current Outpatient Medications on File Prior to Visit  Medication Sig Dispense Refill  . ALPRAZolam (XANAX) 0.5 MG tablet Take 1 tablet (0.5 mg total) by mouth daily as needed for anxiety (before work). 3 tablet 0  . FLUoxetine (PROZAC) 20 MG tablet Take 1 tablet (20 mg total) by mouth daily. 30 tablet 0  . gabapentin (NEURONTIN) 400 MG capsule TAKE ONE CAPSULE 4 TIMES A DAY FOR AGITATION 120 capsule 0  . QUEtiapine (SEROQUEL) 50 MG tablet Take 1 tablet  (50 mg total) by mouth 3 times/day as needed-between meals & bedtime (sleep). 30 tablet 0  . bacitracin ointment Apply 1 application topically 2 (two) times daily. (Patient not taking: Reported on 10/08/2017) 120 g 0   No current facility-administered medications on file prior to visit.    No Known Allergies  History reviewed. No pertinent surgical history. Family History  Problem Relation Age of Onset  . Hypertension Father    Social History   Socioeconomic History  . Marital status: Single    Spouse name: None  . Number of children: None  . Years of education: None  . Highest education level: None  Social Needs  . Financial resource strain: None  . Food insecurity - worry: None  . Food insecurity - inability: None  . Transportation needs - medical: None  . Transportation needs - non-medical: None  Occupational History  . None  Tobacco Use  . Smoking status: Current Every Day Smoker    Packs/day: 1.00    Types: Cigarettes  . Smokeless tobacco: Current User    Types: Chew  Substance and Sexual Activity  . Alcohol use: No    Alcohol/week: 0.6 oz    Types: 1 Cans of beer per week    Frequency: Never    Comment: patient states he has not drank  in 2-3 months  . Drug use: No  . Sexual activity: Not Currently  Other Topics Concern  . None  Social History Narrative  . None   Dep[facility] 10/08/2017 09/07/2017 07/30/2017 07/09/2017 05/17/2017  Decreased Interest 0 0 1 1 1   Down, Depressed, Hopeless 0 0 1 2 -  PHQ - 2 Score 0 0 2 3 1   Altered sleeping - - 2 3 -  Tired, decreased energy - - 2 0 -  Change in appetite - - 2 1 -  Feeling bad or failure about yourself  - - 2 1 -  Trouble concentrating - - 3 2 -  Moving slowly or fidgety/restless - - 3 2 -  Suicidal thoughts - - 0 0 -  PHQ-9 Score - - 16 12 -  Difficult doing work/chores - - Very difficult - -    Review of Systems  Psychiatric/Behavioral: Negative for sleep disturbance.      Objective:    Physical Exam  Constitutional: He is oriented to person, place, and time. He appears well-developed and well-nourished. No distress.  HENT:  Head: Normocephalic and atraumatic.  Eyes: Conjunctivae and EOM are normal.  Neck: Neck supple. No tracheal deviation present.  Cardiovascular: Normal rate.  Pulmonary/Chest: Effort normal. No respiratory distress.  Musculoskeletal: Normal range of motion.  Neurological: He is alert and oriented to person, place, and time.  Skin: Skin is warm and dry.  Psychiatric: He has a normal mood and affect. His behavior is normal.  Nursing note and vitals reviewed.  BP 108/74   Pulse (!) 50   Temp 98.7 F (37.1 C)   Resp 16   Ht 5\' 11"  (1.803 m)   Wt 173 lb 9.6 oz (78.7 kg)   SpO2 98%   BMI 24.21 kg/m     Assessment & Plan:   1. Anxiety state   2. Social phobia   3. Alcohol use with alcohol-induced mood disorder (HCC)   4. Mild benzodiazepine use disorder (HCC) - needs OV for any refills     Meds ordered this encounter  Medications  . lamoTRIgine (LAMICTAL) 25 MG tablet    Sig: Take 1 tab po qd x 2 wk, then 2 tabs po qd x 2wks, then 4 tabs po qd x 1 wk    Dispense:  70 tablet    Refill:  0  . gabapentin (NEURONTIN) 400 MG capsule    Sig: TAKE ONE CAPSULE 4 TIMES A DAY FOR AGITATION    Dispense:  120 capsule    Refill:  0  . ALPRAZolam (XANAX) 0.5 MG tablet    Sig: Take 1 tablet (0.5 mg total) by mouth daily.    Dispense:  30 tablet    Refill:  0    I personally performed the services described in this documentation, which was scribed in my presence. The recorded information has been reviewed and considered, and addended by me as needed.   Justin SorensonEva Shaw, M.D.  [facility]  [place] [address] [place], [place] [address] [phone] phone [phone] fax  10/29/17 12:47 AM

## 2017-11-04 ENCOUNTER — Other Ambulatory Visit: Payer: Self-pay | Admitting: Family Medicine

## 2017-11-05 NOTE — Telephone Encounter (Signed)
Lamictal refill request Last OV 10/08/17 with Dr. Clelia CroftShaw however was not clear on her refill plans. [id number] [place]

## 2017-11-07 ENCOUNTER — Other Ambulatory Visit: Payer: Self-pay | Admitting: Family Medicine

## 2017-11-07 NOTE — Telephone Encounter (Signed)
Copied from CRM [id number]. Topic: Quick Communication - Rx Refill/Question >> Nov 07, 2017 12:25 PM Waymon AmatoBurton, Justin May wrote: Medication:xanax Pt also wanted to let Justin May know that the seroquel that was started last visit is not working he gets anxious pt next appt is 11/16/17  Has the patient contacted their pharmacy? no   (Agent: If no, request that the patient contact the pharmacy for the refill.   Preferred Pharmacy (with phone number [facility] street name):[facility]    Agent: Please be advised that RX refills may take up to 3 business days. We ask that you follow-up with your pharmacy.

## 2017-11-07 NOTE — Telephone Encounter (Deleted)
Copied from CRM [id number]. Topic: Quick Communication - Rx Refill/Question >> Nov 07, 2017 12:30 PM Waymon AmatoBurton, Donna F wrote: Medication: xanax and is wanting to let shaw know that seroquel makes patient vey anxious and that his next appt is 11/16/17   Has the patient contacted their pharmacy? {yes [id number]}no    (Agent: If no, request that the patient contact the pharmacy for the refill.)   Preferred Pharmacy (with phone number or street name): [facility]    Agent: Please be advised that RX refills may take up to 3 business days. We ask that you follow-up with your pharmacy.

## 2017-11-07 NOTE — Telephone Encounter (Signed)
Xanax refill Last OV: 10/08/17 Next OV: 11/16/17 Last Refill:1/19 #30 [facility] [address]  Pt also wanted to let Dr. Clelia CroftShaw know that the Seroquel that was prescribed is making the pt very anxious.

## 2017-11-07 NOTE — Telephone Encounter (Signed)
Xanax refill Last OV: 10/08/17 Next OV: 11/16/17 Last Refill:10/08/17 #30 [facility] [address]   Pt also wants Dr. Clelia CroftShaw to know that the Seroquel that was prescribed is not working and the pt is still feeling an

## 2017-11-08 ENCOUNTER — Ambulatory Visit: Payer: Commercial Managed Care - PPO | Admitting: Family Medicine

## 2017-11-08 NOTE — Addendum Note (Signed)
Addended by: Oneida AlarPETERSON, Margarethe Virgen on: 11/08/2017 03:32 PM   Modules accepted: Orders

## 2017-11-09 MED ORDER — QUETIAPINE FUMARATE 50 MG PO TABS: 50.0000 mg | ORAL_TABLET | Freq: Every evening | ORAL | 0 refills | Status: DC | PRN

## 2017-11-09 MED ORDER — ALPRAZOLAM 0.5 MG PO TABS: 0.5000 mg | ORAL_TABLET | Freq: Every day | ORAL | 0 refills | Status: DC

## 2017-11-09 MED ORDER — LAMOTRIGINE 100 MG PO TABS: 100.0000 mg | ORAL_TABLET | Freq: Every day | ORAL | 0 refills | Status: DC

## 2017-11-09 NOTE — Telephone Encounter (Signed)
Patient is requesting a refill of the following medications: Requested Prescriptions   Pending Prescriptions Disp Refills  . ALPRAZolam (XANAX) 0.5 MG tablet 10 tablet 0    Sig: Take 1 tablet (0.5 mg total) by mouth daily.    Date of patient request: 11/07/17  Last office visit: 10/08/17 Date of last refill: 10/08/17 Last refill amount: #30 0RF Follow up time period per chart: 11/16/17     Pt also wanted to let Clelia CroftShaw know that the seroquel that was started last visit is not working he gets anxious pt next appt is 11/16/17

## 2017-11-09 NOTE — Telephone Encounter (Signed)
Pt wasn't rx'd seroquel - we used this a long time ago for sleep which was effective and he had since discontinued it.

## 2017-11-15 ENCOUNTER — Ambulatory Visit (HOSPITAL_COMMUNITY): Payer: Commercial Managed Care - PPO | Admitting: Psychiatry

## 2017-11-16 ENCOUNTER — Encounter: Payer: Self-pay | Admitting: Family Medicine

## 2017-11-16 ENCOUNTER — Ambulatory Visit (INDEPENDENT_AMBULATORY_CARE_PROVIDER_SITE_OTHER): Payer: Commercial Managed Care - PPO | Admitting: Family Medicine

## 2017-11-16 ENCOUNTER — Other Ambulatory Visit: Payer: Self-pay

## 2017-11-16 VITALS — BP 126/80 | HR 56 | Temp 97.4°F | Resp 18 | Ht 71.0 in | Wt 175.0 lb

## 2017-11-16 DIAGNOSIS — F411 Generalized anxiety disorder: Secondary | ICD-10-CM

## 2017-11-16 DIAGNOSIS — F1310 Sedative, hypnotic or anxiolytic abuse, uncomplicated: Secondary | ICD-10-CM

## 2017-11-16 DIAGNOSIS — F4010 Social phobia, unspecified: Secondary | ICD-10-CM | POA: Diagnosis not present

## 2017-11-16 DIAGNOSIS — G4700 Insomnia, unspecified: Secondary | ICD-10-CM | POA: Diagnosis not present

## 2017-11-16 DIAGNOSIS — R41840 Attention and concentration deficit: Secondary | ICD-10-CM | POA: Diagnosis not present

## 2017-11-16 DIAGNOSIS — F1910 Other psychoactive substance abuse, uncomplicated: Secondary | ICD-10-CM | POA: Diagnosis not present

## 2017-11-16 MED ORDER — ALPRAZOLAM 0.5 MG PO TABS: 0.5000 mg | ORAL_TABLET | Freq: Two times a day (BID) | ORAL | 2 refills | Status: DC | PRN

## 2017-11-16 MED ORDER — BUPROPION HCL ER (XL) 150 MG PO TB24: 150.0000 mg | ORAL_TABLET | Freq: Every day | ORAL | 1 refills | Status: DC

## 2017-11-16 NOTE — Patient Instructions (Addendum)
For ADD testing by interview only (so not a lot of electronic/vision/blood tests that you could get charged for.) [facility] [address], [address], [place], [place] [address] Phone: [phone]   IF you received an x-ray today, you will receive an invoice from [facility]. Please contact [facility] at [phone] with questions or concerns regarding your invoice.   IF you received labwork today, you will receive an invoice from [facility]. Please contact [facility] at [phone] with questions or concerns regarding your invoice.   Our billing staff will not be able to assist you with questions regarding bills from these companies.  You will be contacted with the lab results as soon as they are available. The fastest way to get your results is to activate your My Chart account. Instructions are located on the last page of this paperwork. If you have not heard from us regarding the results in 2 weeks, please contact this office.

## 2017-11-16 NOTE — Progress Notes (Signed)
Subjective:  By signing my name below, I, [name], attest that this documentation has been prepared under the direction and in the presence of [name], MD Electronically Signed: Charline Bills, ED Scribe 11/16/2017 at 9:40 AM.   Patient ID: Justin May, male    DOB: 08-Jul-1996, 22 y.o.   MRN: [id number]  Chief Complaint  Patient presents with  . Anxiety    GAD 7 score 6, pt states Lamictal made him jittery and didn't make him feel good. Pt states he increased his Xanax to 3 to 4 pills once every couple of days. Pt states he doesn't have to take this much daily.  . Follow-up   HPI Justin May is a 22 y.o. male who presents to Primary Care at [facility] for f/u. Drug database shows drug fill alprazolam of 0.5 #30 on 1/7 and 2/8. Pt states that he would take 4 tabs at a time which lasts for ~2 days. States he stopped Lamictal 3 wks in since it made him jittery. States he is doing well overall though and is not worrying about things as much and he was previously. He has cut back on alcohol consumption a lot but occasionally has a beer. Also reports occasional marijuana use. Plans to join the [employer] after March. Pt has never been tested for ADHD but his brother has ADD. Suspected he had ADHD in school and took a friend's adderall which he states calmed him down. Denies drinking a lot of caffeine but states it does not effect him when he does. Pt thinks he tried wellbutrin in the past but is unsure how he did on it. He is taking 2-3 gabapentin tabs every other day, 4 occasionally. Reports gabapentin seems to improve HAs as well. He has only used Seroquel once to assist with sleep.  Past Medical History:  Diagnosis Date  . Anxiety   . Asthma   . Seizures (HCC)    Current Outpatient Medications on File Prior to Visit  Medication Sig Dispense Refill  . ALPRAZolam (XANAX) 0.5 MG tablet Take 1 tablet (0.5 mg total) by mouth daily. 10 tablet 0  . gabapentin (NEURONTIN) 400 MG capsule TAKE  ONE CAPSULE 4 TIMES A DAY FOR AGITATION 120 capsule 0  . QUEtiapine (SEROQUEL) 50 MG tablet Take 1 tablet (50 mg total) by mouth at bedtime as needed (sleep). 30 tablet 0  . lamoTRIgine (LAMICTAL) 100 MG tablet Take 1 tablet (100 mg total) by mouth daily. Increase to 2 tabs po qd after 2 weeks (Patient not taking: Reported on 11/16/2017) 45 tablet 0   No current facility-administered medications on file prior to visit.    No Known Allergies   History reviewed. No pertinent surgical history. Family History  Problem Relation Age of Onset  . Hypertension Father    Social History   Socioeconomic History  . Marital status: Single    Spouse name: None  . Number of children: None  . Years of education: None  . Highest education level: None  Social Needs  . Financial resource strain: None  . Food insecurity - worry: None  . Food insecurity - inability: None  . Transportation needs - medical: None  . Transportation needs - non-medical: None  Occupational History  . None  Tobacco Use  . Smoking status: Current Every Day Smoker    Packs/day: 1.00    Types: Cigarettes  . Smokeless tobacco: Current User    Types: Chew  Substance and Sexual Activity  .  Alcohol use: No    Alcohol/week: 0.6 oz    Types: 1 Cans of beer per week    Frequency: Never    Comment: patient states he has not drank in 2-3 months  . Drug use: Yes    Types: Marijuana  . Sexual activity: Not Currently  Other Topics Concern  . None  Social History Narrative  . None   Depression screen [facility] 2/9 11/16/2017 10/08/2017 09/07/2017 07/30/2017 07/09/2017  Decreased Interest 0 0 0 1 1  Down, Depressed, Hopeless 0 0 0 1 2  PHQ - 2 Score 0 0 0 2 3  Altered sleeping - - - 2 3  Tired, decreased energy - - - 2 0  Change in appetite - - - 2 1  Feeling bad or failure about yourself  - - - 2 1  Trouble concentrating - - - 3 2  Moving slowly or fidgety/restless - - - 3 2  Suicidal thoughts - - - 0 0  PHQ-9 Score - - - 16 12    Difficult doing work/chores - - - Very difficult -    Review of Systems  Psychiatric/Behavioral: The patient is nervous/anxious.       Objective:   Physical Exam  Constitutional: He is oriented to person, place, and time. He appears well-developed and well-nourished. No distress.  HENT:  Head: Normocephalic and atraumatic.  Eyes: Conjunctivae and EOM are normal.  Neck: Neck supple. No tracheal deviation present.  Cardiovascular: Normal rate.  Pulmonary/Chest: Effort normal. No respiratory distress.  Musculoskeletal: Normal range of motion.  Neurological: He is alert and oriented to person, place, and time.  Skin: Skin is warm and dry. No erythema.  Psychiatric: He has a normal mood and affect. His behavior is normal.  Nursing note and vitals reviewed.  BP 126/80 (BP Location: Left Arm, Patient Position: Sitting, Cuff Size: Normal)   Pulse (!) 56   Temp (!) 97.4 F (36.3 C) (Oral)   Resp 18   Ht 5\' 11"  (1.803 m)   Wt 175 lb (79.4 kg)   SpO2 100%   BMI 24.41 kg/m     Assessment & Plan:   1. Anxiety state - doing better on prn alprazolam and gabapentin.  2. Social phobia   3. Inattention - trial of wellbutrin as wonder if pt might had ADD - brother diagnosed, pt certainly displays many sxs, could be cause of social phobia/anxiety but counselled that we would have to get him off BZD if he wanted to try stimulant therapy. Referred to [facility] for diagnostic testing for ADD - gave pt self-assessment ADD handouts to complete and RTC.  4. Insomnia, unspecified type - rare prn seroquel  5. Mild benzodiazepine use disorder (HCC) - watch use carefully - gets #40/mo only of alprazolam. Needs OV for any refills.   6. Substance abuse, binge pattern (HCC) - due for UDS at f/u    Meds ordered this encounter  Medications  . ALPRAZolam (XANAX) 0.5 MG tablet    Sig: Take 1 tablet (0.5 mg total) by mouth 2 (two) times daily as needed for anxiety.    Dispense:  40 tablet     Refill:  2    Do not fill more frequently than every 28 days  . buPROPion (WELLBUTRIN XL) 150 MG 24 hr tablet    Sig: Take 1 tablet (150 mg total) by mouth daily. Increase to 2 tabs a day after 2 weeks.    Dispense:  60 tablet  Refill:  1    I personally performed the services described in this documentation, which was scribed in my presence. The recorded information has been reviewed and considered, and addended by me as needed.   [name], M.D.  Primary Care at [facility] [address] [place], [place] [address] [phone] phone [phone] fax  11/19/17 12:30 AM

## 2017-11-19 ENCOUNTER — Other Ambulatory Visit: Payer: Self-pay | Admitting: Family Medicine

## 2017-11-19 DIAGNOSIS — F1910 Other psychoactive substance abuse, uncomplicated: Secondary | ICD-10-CM | POA: Insufficient documentation
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
## 2017-11-19 NOTE — Telephone Encounter (Signed)
Patient is calling back in regards to this. Please advise.  [phone]

## 2017-11-20 NOTE — Telephone Encounter (Signed)
Please Advise
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
## 2017-11-21 MED ORDER — ALPRAZOLAM 0.5 MG PO TABS: 0.5000 mg | ORAL_TABLET | Freq: Two times a day (BID) | ORAL | 0 refills | Status: DC | PRN

## 2017-12-07 ENCOUNTER — Other Ambulatory Visit: Payer: Self-pay | Admitting: Family Medicine

## 2017-12-07 NOTE — Telephone Encounter (Signed)
LOV: 11/16/17  Dr. Clelia CroftShaw  [facility] [address]

## 2017-12-21 ENCOUNTER — Other Ambulatory Visit: Payer: Self-pay | Admitting: Family Medicine

## 2017-12-23 ENCOUNTER — Encounter: Payer: Self-pay | Admitting: Family Medicine
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
## 2017-12-24 ENCOUNTER — Telehealth: Payer: Self-pay | Admitting: Family Medicine

## 2017-12-24 NOTE — Telephone Encounter (Signed)
Copied from CRM [phone]. Topic: Quick Communication - See Telephone Encounter >> Dec 24, 2017  2:23 PM Landry MellowFoltz, Melissa J wrote: CRM for notification. See Telephone encounter for: 12/24/17. Pt is asking to see if his medication can be increased to 1 mg once a day. He said it is starting to not work now.  Cb is [phone] [facility] [address]   This is for the alprazolam

## 2017-12-25 NOTE — Telephone Encounter (Signed)
No - pt aware that we have been down the road of his rapidly escalating tolerance before with scary outcomes, and I just increased the quantity/mo at his visit ~5 wks ago so that he can take 1mg  qd for 10d out of the month.  When we started this med several mos ago, he anticipated needing only a dose once every few days so we cannot continue to escalate doses so quickly.  Will be happy to discuss alternative meds at his next visit but STRONGLY suggest pt make appt with psychiatrist as they are often comfortable using medications at much higher doses since they have more experience and detailed knowledge of "off-label" doses and meds.  [facility] [address] [place], [place] [address] [phone]  [facility] [address] [place], [place] [address] [phone]  [facility] [address] [place], [place] [address] [phone]  [facility] (make sure to request MD for meds) [address] [place], [place] [address] [phone]   [facility] [address], [address], [place], [id number] Phone: [phone]

## 2017-12-25 NOTE — Telephone Encounter (Signed)
Please advise 

## 2017-12-25 NOTE — Telephone Encounter (Signed)
Patient is wanting to see if his dosing of Xanax can be increased to 1 mg. He states he has been doing well on the medication- but at times he feels he may need more to control his anxiety state. He is grateful that Dr Clelia CroftShaw is trying to help him and he knows this is a process. Reminded patient he may need to come in to discuss his request/need. He does have an up coming appointment 01/14/18.he will await response.

## 2017-12-25 NOTE — Telephone Encounter (Signed)
Phone call to patient. Relayed message from provider, he verbalizes understanding. Mychart message sent to patient with list of recommended psychiatrists.

## 2018-01-14 ENCOUNTER — Ambulatory Visit: Payer: Commercial Managed Care - PPO | Admitting: Family Medicine

## 2018-02-08 ENCOUNTER — Other Ambulatory Visit: Payer: Self-pay | Admitting: Family Medicine

## 2018-02-11 ENCOUNTER — Other Ambulatory Visit: Payer: Self-pay | Admitting: Family Medicine

## 2018-02-11 NOTE — Telephone Encounter (Signed)
Xanax refill request  LOV 11/16/17 with Dr. Clelia Croft  Last refill:  11/21/17  #[facility]  [id number] [place] [place] - [address]

## 2018-02-11 NOTE — Telephone Encounter (Signed)
Patient requesting refill, please advise. Thank you 

## 2018-02-11 NOTE — Telephone Encounter (Signed)
Needs office visit.

## 2018-02-11 NOTE — Telephone Encounter (Signed)
LOV 11/16/2017 with Dr. Clelia Croft / Refill request for xanax / Last fill 11/21/2017 #40, 0 refills /

## 2018-02-15 ENCOUNTER — Encounter: Payer: Self-pay | Admitting: Family Medicine

## 2018-02-15 ENCOUNTER — Ambulatory Visit: Payer: Self-pay

## 2018-02-15 ENCOUNTER — Ambulatory Visit (INDEPENDENT_AMBULATORY_CARE_PROVIDER_SITE_OTHER): Payer: Commercial Managed Care - PPO | Admitting: Family Medicine

## 2018-02-15 VITALS — BP 118/78 | HR 64 | Temp 98.2°F | Resp 17 | Ht 70.5 in | Wt 160.0 lb

## 2018-02-15 DIAGNOSIS — F1910 Other psychoactive substance abuse, uncomplicated: Secondary | ICD-10-CM

## 2018-02-15 DIAGNOSIS — F4010 Social phobia, unspecified: Secondary | ICD-10-CM

## 2018-02-15 DIAGNOSIS — R05 Cough: Secondary | ICD-10-CM | POA: Diagnosis not present

## 2018-02-15 DIAGNOSIS — F1094 Alcohol use, unspecified with alcohol-induced mood disorder: Secondary | ICD-10-CM

## 2018-02-15 DIAGNOSIS — R030 Elevated blood-pressure reading, without diagnosis of hypertension: Secondary | ICD-10-CM | POA: Diagnosis not present

## 2018-02-15 DIAGNOSIS — R059 Cough, unspecified: Secondary | ICD-10-CM

## 2018-02-15 DIAGNOSIS — Z79899 Other long term (current) drug therapy: Secondary | ICD-10-CM

## 2018-02-15 DIAGNOSIS — F17200 Nicotine dependence, unspecified, uncomplicated: Secondary | ICD-10-CM | POA: Diagnosis not present

## 2018-02-15 DIAGNOSIS — R634 Abnormal weight loss: Secondary | ICD-10-CM

## 2018-02-15 DIAGNOSIS — R0981 Nasal congestion: Secondary | ICD-10-CM

## 2018-02-15 DIAGNOSIS — G4700 Insomnia, unspecified: Secondary | ICD-10-CM

## 2018-02-15 DIAGNOSIS — F1310 Sedative, hypnotic or anxiolytic abuse, uncomplicated: Secondary | ICD-10-CM

## 2018-02-15 LAB — POCT URINALYSIS DIP (MANUAL ENTRY)
BILIRUBIN UA: NEGATIVE mg/dL
Bilirubin, UA: NEGATIVE
Blood, UA: NEGATIVE
GLUCOSE UA: NEGATIVE mg/dL
LEUKOCYTES UA: NEGATIVE
Nitrite, UA: NEGATIVE
Protein Ur, POC: NEGATIVE mg/dL
Spec Grav, UA: 1.02 (ref 1.010–1.025)
UROBILINOGEN UA: 1 U/dL
pH, UA: 7.5 (ref 5.0–8.0)

## 2018-02-15 LAB — POCT CBC
GRANULOCYTE PERCENT: 56.2 % (ref 37–80)
HCT, POC: 53.8 % — AB (ref 43.5–53.7)
Hemoglobin: 17.9 g/dL (ref 14.1–18.1)
LYMPH, POC: 2 (ref 0.6–3.4)
MCH, POC: 29.9 pg (ref 27–31.2)
MCHC: 33.3 g/dL (ref 31.8–35.4)
MCV: 89.7 fL (ref 80–97)
MID (CBC): 0.3 (ref 0–0.9)
MPV: 7.8 fL (ref 0–99.8)
POC Granulocyte: 3 (ref 2–6.9)
POC LYMPH %: 37.4 % (ref 10–50)
POC MID %: 6.4 %M (ref 0–12)
Platelet Count, POC: 315 10*3/uL (ref 142–424)
RBC: 6 M/uL (ref 4.69–6.13)
RDW, POC: 13.4 %
WBC: 5.3 10*3/uL (ref 4.6–10.2)

## 2018-02-15 MED ORDER — BUPROPION HCL ER (XL) 150 MG PO TB24: 150.0000 mg | ORAL_TABLET | Freq: Every day | ORAL | 1 refills | Status: DC

## 2018-02-15 MED ORDER — ALPRAZOLAM 0.25 MG PO TABS: 0.2500 mg | ORAL_TABLET | Freq: Every evening | ORAL | 0 refills | Status: DC | PRN

## 2018-02-15 MED ORDER — TRAZODONE HCL 50 MG PO TABS: 50.0000 mg | ORAL_TABLET | Freq: Every day | ORAL | 1 refills | Status: DC

## 2018-02-15 MED ORDER — TRIAMCINOLONE ACETONIDE 55 MCG/ACT NA AERO: 2.0000 | INHALATION_SPRAY | Freq: Every day | NASAL | 12 refills | Status: DC

## 2018-02-15 MED ORDER — BENZONATATE 200 MG PO CAPS: 200.0000 mg | ORAL_CAPSULE | Freq: Three times a day (TID) | ORAL | 0 refills | Status: DC | PRN

## 2018-02-15 MED ORDER — CETIRIZINE-PSEUDOEPHEDRINE ER 5-120 MG PO TB12
1.0000 | ORAL_TABLET | Freq: Two times a day (BID) | ORAL | 1 refills | Status: DC
Start: 2018-02-15 — End: 2018-10-31

## 2018-02-15 NOTE — Patient Instructions (Addendum)
If the trazodone isn't working for sleep, than I think we should retry the seroquel - ok to call for this. Ok to call for a refill of the alprazolam when needed as long as you are continuing to do ok. I think the wellbutrin will help you stop smoking, help your ADD, and support you in your sobriety but increasing the "happy" dopamine neurotransmitter in your brain.  But if your mood symptoms are worsening with more anxiety, please stop the wellbutrin and come back in to see me immediately.   IF you received an x-ray today, you will receive an invoice from [facility]. Please contact [facility] at [phone] with questions or concerns regarding your invoice.   IF you received labwork today, you will receive an invoice from [facility]. Please contact [facility] at [phone] with questions or concerns regarding your invoice.   Our billing staff will not be able to assist you with questions regarding bills from these companies.  You will be contacted with the lab results as soon as they are available. The fastest way to get your results is to activate your My Chart account. Instructions are located on the last page of this paperwork. If you have not heard from [place] regarding the results in 2 weeks, please contact this office.    Coping with Quitting Smoking Quitting smoking is a physical and mental challenge. You will face cravings, withdrawal symptoms, and temptation. Before quitting, work with your health care provider to make a plan that can help you cope. Preparation can help you quit and keep you from giving in. How can I cope with cravings? Cravings usually last for 5-10 minutes. If you get through it, the craving will pass. Consider taking the following actions to help you cope with cravings:  Keep your mouth busy: ? Chew sugar-free gum. ? Suck on hard candies or a straw. ? Brush your teeth.  Keep your hands and body busy: ? Immediately change to a different activity  when you feel a craving. ? Squeeze or play with a ball. ? Do an activity or a hobby, like making bead jewelry, practicing needlepoint, or working with wood. ? Mix up your normal routine. ? Take a short exercise break. Go for a quick walk or run up and down stairs. ? Spend time in public places where smoking is not allowed.  Focus on doing something kind or helpful for someone else.  Call a friend or family member to talk during a craving.  Join a support group.  Call a quit line, such as 1-800-QUIT-NOW.  Talk with your health care provider about medicines that might help you cope with cravings and make quitting easier for you.  How can I deal with withdrawal symptoms? Your body may experience negative effects as it tries to get used to not having nicotine in the system. These effects are called withdrawal symptoms. They may include:  Feeling hungrier than normal.  Trouble concentrating.  Irritability.  Trouble sleeping.  Feeling depressed.  Restlessness and agitation.  Craving a cigarette.  To manage withdrawal symptoms:  Avoid places, people, and activities that trigger your cravings.  Remember why you want to quit.  Get plenty of sleep.  Avoid coffee and other caffeinated drinks. These may worsen some of your symptoms.  How can I handle social situations? Social situations can be difficult when you are quitting smoking, especially in the first few weeks. To manage this, you can:  Avoid parties, bars, and other social situations where people  might be smoking.  Avoid alcohol.  Leave right away if you have the urge to smoke.  Explain to your family and friends that you are quitting smoking. Ask for understanding and support.  Plan activities with friends or family where smoking is not an option.  What are some ways I can cope with stress? Wanting to smoke may cause stress, and stress can make you want to smoke. Find ways to manage your stress. Relaxation  techniques can help. For example:  Breathe slowly and deeply, in through your nose and out through your mouth.  Listen to soothing, relaxing music.  Talk with a family member or friend about your stress.  Light a candle.  Soak in a bath or take a shower.  Think about a peaceful place.  What are some ways I can prevent weight gain? Be aware that many people gain weight after they quit smoking. However, not everyone does. To keep from gaining weight, have a plan in place before you quit and stick to the plan after you quit. Your plan should include:  Having healthy snacks. When you have a craving, it may help to: ? Eat plain popcorn, crunchy carrots, celery, or other cut vegetables. ? Chew sugar-free gum.  Changing how you eat: ? Eat small portion sizes at meals. ? Eat 4-6 small meals throughout the day instead of 1-2 large meals a day. ? Be mindful when you eat. Do not watch television or do other things that might distract you as you eat.  Exercising regularly: ? Make time to exercise each day. If you do not have time for a long workout, do short bouts of exercise for 5-10 minutes several times a day. ? Do some form of strengthening exercise, like weight lifting, and some form of aerobic exercise, like running or swimming.  Drinking plenty of water or other low-calorie or no-calorie drinks. Drink 6-8 glasses of water daily, or as much as instructed by your health care provider.  Summary  Quitting smoking is a physical and mental challenge. You will face cravings, withdrawal symptoms, and temptation to smoke again. Preparation can help you as you go through these challenges.  You can cope with cravings by keeping your mouth busy (such as by chewing gum), keeping your body and hands busy, and making calls to family, friends, or a helpline for people who want to quit smoking.  You can cope with withdrawal symptoms by avoiding places where people smoke, avoiding drinks with  caffeine, and getting plenty of rest.  Ask your health care provider about the different ways to prevent weight gain, avoid stress, and handle social situations. This information is not intended to replace advice given to you by your health care provider. Make sure you discuss any questions you have with your health care provider. Document Released: 09/15/2016 Document Revised: 09/15/2016 Document Reviewed: 09/15/2016 Elsevier Interactive Patient Education  2018 [employer].   Substance Use Disorder and Mental Illness Substance use disorder is a condition in which a person is dependent on a substance, such as drugs or alcohol. A mental illness is a condition that occurs when someone experiences changes in mood, behavior, or thinking. Sometimes, these two conditions can occur at the same time (co-occurring disorders) and may be diagnosed together (dual diagnosis). What is the relationship between substance use disorder and mental illness? Substance use disorder and mental illness can share symptoms and can have similar causes, such as exposure to stress or changes in brain chemicals. The risk for  developing both of these conditions can be passed from parent to child (inherited). People with mental illnesses sometimes use drugs to try and relieve symptoms, and this can lead to substance use disorder. Substance use disorders occur more often in people who have depression, schizophrenia, anxiety, or personality disorders. When some drugs are used regularly, they can cause people to have symptoms of mental illness. What are the signs or symptoms? Symptoms vary widely for substance use disorder and mental illness, especially because these conditions can be present at the same time. Signs of a substance use disorder may include:  Failure to meet responsibilities at home, work, or school.  Using substances in risky situation, such as while driving or using machinery.  Taking serious risks to get drugs  or alcohol.  Spending less time on activities or hobbies that used to be important.  Changes in personality or attitude for no reason, such as angry outbursts, symptoms of anxiety, or unusual giddiness.  Trying to hide the amount of drugs or alcohol used.  Increased substance use over time, or needing to use more of a substance to feel the same effects (developing a tolerance).  Sudden weight loss or gain.  Sleeping too much or too little.  Uncontrolled trembling or shaking (tremors), slurred speech, or lack of coordination.  Continuing to use alcohol or a drug even though using it has led to bad outcomes or consequences, such as losing a job or ending a relationship.  Signs of mental illness may include:  Extreme mood changes (mood swings).  Sleeping too much or too little.  Weight loss or weight gain.  Being easily distracted.  Confused thinking or trouble concentrating.  Expressing thoughts of suicide.  Withdrawing from friends and family, or sudden changes in social behaviors or hobbies.  Aggression toward people and animals.  Repeatedly breaking serious rules or breaking the law.  Having persistent thoughts or urges that are unpleasant or feel out of control (involuntary). The person may feel the need to act on the urges in order to reduce anxiety.  Persistent feelings of sadness or hopelessness.  False beliefs (delusions).  Seeing, hearing, tasting, smelling, or feeling things that are not real (hallucinations).  How is this diagnosed? Substance use disorder and mental illnesses can be difficult to diagnose at the same time because of how varied and complex the symptoms are. Because these conditions can interact, one condition may be missed. This is why it is important to be completely honest with your health care provider about substance use and your other symptoms. Diagnosing your condition may include:  A physical exam.  A review of your medical history  and your symptoms.  Your health care provider may refer you to a mental health professional for a psychiatric evaluation. This may include assessments of:  Your use of substances.  Your risk of suicide.  Your risk of aggressive behaviors.  Your lifestyle, environment, and social situations.  Your medical health.  Your mental health and behavioral history.  How is this treated? It is best to treat substance use disorder and mental illness at the same time (integrated treatment approach). Treatment usually involves more than one of the following methods:  Detox. This refers to stopping substance abuse while being monitored by trained medical staff. This is usually the first step in treatment. Detox can last for up to 7 days.  Rehabilitation. This involves staying in a treatment center where you can have medical and mental health support all the time.  Medicines  to relieve symptoms of mental illness and to control symptoms that are caused by stopping substance abuse (withdrawal symptoms).  Support groups. These groups encourage you to talk about your fears, frustrations, and anxieties with others who have the same condition.  Talk therapy. This is one-on-one therapy that can help you learn about your illness and learn ways to cope with symptoms or side effects.  Follow these instructions at home: Lifestyle  Exercise regularly. Aim for 150 minutes of moderate exercise (such as walking or biking) or 75 minutes of vigorous exercise (such as running) each week.  Eat a healthy diet with plenty of fruits and vegetables, whole grains, and lean proteins.  Avoid caffeine and tobacco. These can worsen symptoms and anxiety.  Do not drink alcohol or use drugs.  Try to get 7-9 hours of sleep each night. To do this: ? Keep your bedroom cool and dark. ? Do not eat a heavy meal during the hour before you go to bed. ? Do not have caffeine before bedtime. ? Avoid screen time during the few  hours before bedtime. This means not watching TV and not using a computer, cell phone, or tablet. Medicines  Take over-the-counter and prescription medicines only as told by your health care provider.  Do not stop taking medicines unless you ask your health care provider if it is safe to do that.  Tell your health care provider about any medicine side effects that you experience. General instructions  Follow your treatment plan as directed. Work with your health care provider to adjust your treatment plan as needed.  Attend support group or therapy sessions as directed.  Explain your diagnosis to your friends and family. Let them know what your symptoms are and what things cause your symptoms to start (triggers).  Avoid triggers or stressors that may worsen your symptoms or cause you to use alcohol or drugs. Spend time with family and friends who do not use substances.  Make time to relax and do self-soothing activities, such as meditating or listening to music.  Keep all follow-up visits as told by your health care provider and therapist. This is important. Where to find support: You may find support for coping with substance use disorder and mental illness from:  Your health care providers or your therapist. These providers can treat you or they can help you find services to treat your condition.  Local support groups for people with your condition. Your health care provider or therapist may be able to recommend a support group. This may be a hospital support group, a [employer] on Mental Illness (NAMI) support group, or a 12-step group such as Alcoholics Anonymous (AA) or Narcotics Anonymous (NA).  Family and friends. Let them know what they can do to best support you through your recovery process.  Where to find more information: You may find more information about substance use disorder and mental illness from:  Substance Abuse and Mental Health Services Administration  [facility]): ? Online: SkateOasis.com.pt ? [employer], to talk with a person who can help you find information about treatment services in your area: ([phone] 417-487-3630)  [place] Department of Health and Health and safety inspector mental health services: https://www.vaughan-marshall.com/  National Alliance on Mental Illness (NAMI): www.nami.org  [employer] on Alcoholism and Drug Dependence: www.ncadd.org  Contact a health care provider if:  Your symptoms get worse or they do not get better.  You have negative side effects from taking medicines.  You want to discuss stopping medicines or treatment.  You start using a substance again (have a relapse). Get help right away if:  You have thoughts about harming yourself or others. If you ever feel like you may hurt yourself or others, or have thoughts about taking your own life, get help right away. You can go to your nearest emergency department or call:  Your local emergency services (911 in the [place]  A suicide crisis helpline, such as the National Suicide Prevention Lifeline at 386-698-1094. This is open 24 hours a day.  Summary  Substance use disorder and mental illness can occur at the same time (co-occurring disorders), and they may be diagnosed together (dual diagnosis).  These conditions can be difficult to diagnose at the same time. It is important to be completely honest with your health care provider about your substance use and your other symptoms.  It is best to treat substance use disorder and mental illness at the same time (integrated treatment approach).  Identifying new ways to deal with triggers and difficult situations is an important part of recovery.  Keep all follow-up visits as told by your health care provider and therapist. Be consistent when attending support groups or treatment programs. This information is not intended to replace advice given to you by your health care provider. Make sure you discuss any  questions you have with your health care provider. Document Released: 02/02/2017 Document Revised: 02/02/2017 Document Reviewed: 02/02/2017 Elsevier Interactive Patient Education  2018 [employer].

## 2018-02-15 NOTE — Telephone Encounter (Signed)
Pt called to notify that after taking the 0.25 mg Xanax- it made him feels nauseated and "a little out of it."  Pt is asking if he can possibly stay off it or take it as needed twice a day. Pt states that he was off Xanax for a month and he did not want to restart it.

## 2018-02-15 NOTE — Progress Notes (Signed)
Subjective:  By signing my name below, I, [name], attest that this documentation has been prepared under the direction and in the presence of [name], MD Electronically Signed: Charline Bills, ED Scribe 02/15/2018 at 11:55 AM.   Patient ID: Justin May, male    DOB: 04/04/1996, 22 y.o.   MRN: [id number]  Chief Complaint  Patient presents with  . Medication Refill   HPI Justin May is a 22 y.o. male who presents to Primary Care at [facility] for medication refill. Last seen 3 months prior. Increased alprazolam to 0.5 #40/month. He was given a 3 month supply. Pt brought up concern of possible untreated ADD, given self-assessment handouts which was on him to complete and referred to [facility] for testing. Anxiety had improved on regimen of alprazolam and gabapentin with rare prn Seroquel for sleep. Alprazolam filled 2/20, 3/23 and 4/21. - Pt states that he would like to lower the dosage of Alprazolam. He would like to eventually come off of the meds but reports that he took more than prescribed last visit since he ran out 1-2 days prior to sobriety. States he doesn't plan to use Xanax daily but would like to have it on hand as a "rescue" medication if he ever needed it. States he is better to handle his anxiety now.  Smoking Cessation Pt wants to discuss smoking cessation.  Sobriety Pt decided to restart this journey 2 wks ago when he drunk an entire bottle of Darrel Reach, missed dinner with his family, got into an argument with Amy which resulted in police involvement and his father coming over to speak to him about his poor decisions. Pt also taking 30 hours of drug classes.  Weight Loss Pt attributes weight loss to being sober for the past 2 wks; states his appetite has decreased. Wt Readings from Last 3 Encounters:  02/15/18 160 lb (72.6 kg)  11/16/17 175 lb (79.4 kg)  10/08/17 173 lb 9.6 oz (78.7 kg)   URI Pt presents with chills, productive cough with green  sputum, intermittent bilateral stinging ear pain and frontal sinus pain for several days. States symptoms have improved some overall. Pt tried Mucinex initially with some relief.  Difficulty Sleeping Pt reports difficulty sleeping since being sober for the past 2 wks. States the Seroquel worked but made him groggy. Pt states the hydroxyzine did not work well for him.  Past Medical History:  Diagnosis Date  . Anxiety   . Asthma   . Seizures (HCC)    Current Outpatient Medications on File Prior to Visit  Medication Sig Dispense Refill  . ALPRAZolam (XANAX) 0.5 MG tablet Take 1 tablet (0.5 mg total) by mouth 2 (two) times daily as needed for anxiety. 40 tablet 0   No current facility-administered medications on file prior to visit.     No past surgical history on file.  No Known Allergies Family History  Problem Relation Age of Onset  . Hypertension Father    Social History   Socioeconomic History  . Marital status: Single    Spouse name: Not on file  . Number of children: Not on file  . Years of education: Not on file  . Highest education level: Not on file  Occupational History  . Not on file  Social Needs  . Financial resource strain: Not on file  . Food insecurity:    Worry: Not on file    Inability: Not on file  . Transportation needs:    Medical:  Not on file    Non-medical: Not on file  Tobacco Use  . Smoking status: Current Every Day Smoker    Packs/day: 1.00    Types: Cigarettes  . Smokeless tobacco: Current User    Types: Chew  Substance and Sexual Activity  . Alcohol use: No    Alcohol/week: 0.6 oz    Types: 1 Cans of beer per week    Frequency: Never    Comment: patient states he has not drank in 2-3 months  . Drug use: Yes    Types: Marijuana  . Sexual activity: Not Currently  Lifestyle  . Physical activity:    Days per week: Not on file    Minutes per session: Not on file  . Stress: Not on file  Relationships  . Social connections:    Talks  on phone: Not on file    Gets together: Not on file    Attends religious service: Not on file    Active member of club or organization: Not on file    Attends meetings of clubs or organizations: Not on file    Relationship status: Not on file  Other Topics Concern  . Not on file  Social History Narrative  . Not on file   Depression screen [facility] 2/9 02/15/2018 11/16/2017 10/08/2017 09/07/2017 07/30/2017  Decreased Interest 0 0 0 0 1  Down, Depressed, Hopeless 0 0 0 0 1  PHQ - 2 Score 0 0 0 0 2  Altered sleeping - - - - 2  Tired, decreased energy - - - - 2  Change in appetite - - - - 2  Feeling bad or failure about yourself  - - - - 2  Trouble concentrating - - - - 3  Moving slowly or fidgety/restless - - - - 3  Suicidal thoughts - - - - 0  PHQ-9 Score - - - - 16  Difficult doing work/chores - - - - Very difficult     Review of Systems  Constitutional: Positive for appetite change and chills.  HENT: Positive for ear pain and sinus pain.   Respiratory: Positive for cough.   Psychiatric/Behavioral: Positive for sleep disturbance.      Objective:   Physical Exam  Constitutional: He is oriented to person, place, and time. He appears well-developed and well-nourished. No distress.  HENT:  Head: Normocephalic and atraumatic.  Right Ear: Tympanic membrane is scarred and retracted.  Left Ear: Tympanic membrane is scarred.  Nose: Rhinorrhea present.  Nose: Erythema Mouth/Throat:Erythema and copious postnasal drip  Eyes: Conjunctivae and EOM are normal.  Neck: Neck supple. No tracheal deviation present.  Cardiovascular: Normal rate, regular rhythm, S1 normal, S2 normal and normal heart sounds.  Pulmonary/Chest: Effort normal and breath sounds normal. No respiratory distress.  Lungs clear. Good air movement.  Musculoskeletal: Normal range of motion.  Lymphadenopathy:       Head (right side): Tonsillar (minimal) adenopathy present.       Head (left side): Tonsillar (minimal) adenopathy  present.    He has no cervical adenopathy.       Right cervical: No posterior cervical adenopathy present.      Left cervical: No posterior cervical adenopathy present.  Neurological: He is alert and oriented to person, place, and time.  Skin: Skin is warm and dry.  Psychiatric: He has a normal mood and affect. His behavior is normal.  Nursing note and vitals reviewed.   BP 118/78   Pulse 64   Temp 98.2 F (  36.8 C) (Oral)   Resp 17   Ht 5' 10.5" (1.791 m)   Wt 160 lb (72.6 kg)   SpO2 98%   BMI 22.63 kg/m      Assessment & Plan:   1. Cough   2. Tobacco use disorder - start trial of wellbutrin but stop it immed if any mood sxs worsen.  3. Sinus congestion   4. Elevated blood pressure reading   5. Loss of weight   6. High risk medication use   7. Insomnia, unspecified type - doing well on trazodone currently - prev seemed to do very well on seroquel for sleep but pt reluctant to use but if trazodone stops being effective, would encourage him to retry seroquel  8. Mild benzodiazepine use disorder (HCC) - doing MUCH better and does seem to be able to use low dose alprazolam very sparingly - will need to cont to show stability in all areas of life to get refills.  9. Substance abuse, binge pattern (HCC)   10. Alcohol use with alcohol-induced mood disorder (HCC)   11. Social anxiety disorder - I do suspect pt has a component of undiagnosed ADHD - encouraged him to get psychological testing for this as it run in his family (his brother) and would explain why he can't hold a job and has so much social anxiety - is always in his head about what others are thinking about him - try wellbutrin as may help ADD type sxs as well as help him stop smoking but warned of potential to worsen anxiety - if any mood sxs worsen, stop wellbutrin immed and RTC.     Orders Placed This Encounter  Procedures  . ToxASSURE Select 13 (MW), Urine  . Comprehensive metabolic panel  . TSH+T4F+T3Free  . POCT CBC    . POCT urinalysis dipstick    Meds ordered this encounter  Medications  . traZODone (DESYREL) 50 MG tablet    Sig: Take 1-2 tablets (50-100 mg total) by mouth at bedtime.    Dispense:  90 tablet    Refill:  1  . cetirizine-pseudoephedrine (ZYRTEC-D) 5-120 MG tablet    Sig: Take 1 tablet by mouth 2 (two) times daily.    Dispense:  28 tablet    Refill:  1  . ALPRAZolam (XANAX) 0.25 MG tablet    Sig: Take 1 tablet (0.25 mg total) by mouth at bedtime as needed for anxiety.    Dispense:  60 tablet    Refill:  0  . triamcinolone (NASACORT) 55 MCG/ACT AERO nasal inhaler    Sig: Place 2 sprays into the nose daily.    Dispense:  1 Inhaler    Refill:  12  . benzonatate (TESSALON) 200 MG capsule    Sig: Take 1 capsule (200 mg total) by mouth 3 (three) times daily as needed for cough.    Dispense:  30 capsule    Refill:  0  . buPROPion (WELLBUTRIN XL) 150 MG 24 hr tablet    Sig: Take 1 tablet (150 mg total) by mouth daily. Increase to 2 tabs a day after 2 weeks.    Dispense:  60 tablet    Refill:  1    I personally performed the services described in this documentation, which was scribed in my presence. The recorded information has been reviewed and considered, and addended by me as needed.   [name], M.D.  Primary Care at [facility] [address] [place], [place] [address] [phone]  phone [phone] fax  05/05/18 11:08 PM

## 2018-02-16 LAB — COMPREHENSIVE METABOLIC PANEL
ALT: 13 IU/L (ref 0–44)
AST: 15 IU/L (ref 0–40)
Albumin/Globulin Ratio: 2.3 — ABNORMAL HIGH (ref 1.2–2.2)
Albumin: 5.8 g/dL — ABNORMAL HIGH (ref 3.5–5.5)
Alkaline Phosphatase: 93 IU/L (ref 39–117)
BILIRUBIN TOTAL: 0.4 mg/dL (ref 0.0–1.2)
BUN / CREAT RATIO: 8 — AB (ref 9–20)
BUN: 8 mg/dL (ref 6–20)
CO2: 19 mmol/L — AB (ref 20–29)
CREATININE: 1.05 mg/dL (ref 0.76–1.27)
Calcium: 10.6 mg/dL — ABNORMAL HIGH (ref 8.7–10.2)
Chloride: 100 mmol/L (ref 96–106)
GFR calc non Af Amer: 101 mL/min/{1.73_m2} (ref >59)
GFR, EST AFRICAN AMERICAN: 117 mL/min/{1.73_m2} (ref >59)
GLUCOSE: 91 mg/dL (ref 65–99)
Globulin, Total: 2.5 g/dL (ref 1.5–4.5)
Potassium: 4 mmol/L (ref 3.5–5.2)
Sodium: 141 mmol/L (ref 134–144)
TOTAL PROTEIN: 8.3 g/dL (ref 6.0–8.5)

## 2018-02-16 LAB — TSH+T4F+T3FREE
Free T4: 1.74 ng/dL (ref 0.82–1.77)
T3, Free: 3.6 pg/mL (ref 2.0–4.4)
TSH: 3.97 u[IU]/mL (ref 0.450–4.500)

## 2018-02-18 NOTE — Telephone Encounter (Signed)
Absolutely stay off of it. We must have had a misunderstanding - I thought that that was what he was planning to do - yes, he should definitely stay off of it - that was just the "rescue" medication in case he got so anxious or mad or when something traumatic happens znc hd hix having difficulty talking himself down - he has something to take to calm him down so he can think a little more rationally and hopefully doesn't end up drinking again or other poor choices to deal.

## 2018-02-18 NOTE — Telephone Encounter (Signed)
See message below. Unable to leave message mailbox is full

## 2018-02-23 LAB — TOXASSURE SELECT 13 (MW), URINE

## 2018-04-08 ENCOUNTER — Telehealth: Payer: Self-pay | Admitting: Family Medicine

## 2018-04-08 ENCOUNTER — Other Ambulatory Visit: Payer: Self-pay | Admitting: Family Medicine

## 2018-04-08 ENCOUNTER — Telehealth: Payer: Self-pay

## 2018-04-08 DIAGNOSIS — F411 Generalized anxiety disorder: Secondary | ICD-10-CM

## 2018-04-08 DIAGNOSIS — F1910 Other psychoactive substance abuse, uncomplicated: Secondary | ICD-10-CM

## 2018-04-08 DIAGNOSIS — F4010 Social phobia, unspecified: Secondary | ICD-10-CM

## 2018-04-08 NOTE — Telephone Encounter (Signed)
Copied from CRM [id number]. Topic: Quick Communication - Rx Refill/Question >> Apr 08, 2018 10:38 AM Zada GirtLander, Justin L wrote: Medication: alprazolam .5mg  instead of .25mg  (Patient would like to increase dose on Alprazolam, mild seizures from stress)  Has the patient contacted their pharmacy? Yes.   (Agent: If no, request that the patient contact the pharmacy for the refill.) (Agent: If yes, when and what did the pharmacy advise?)  Preferred Pharmacy (with phone number or street name): [id number] [place], [place] - [id number] [place] [id number] Phone: [phone] Fax: [phone]  Agent: Please be advised that RX refills may take up to 3 business days. We ask that you follow-up with your pharmacy.  Please call patient if the dose can't be increased.

## 2018-04-08 NOTE — Telephone Encounter (Signed)
Copied from CRM [id number]. Topic: Quick Communication - See Telephone Encounter >> Apr 08, 2018  4:48 PM Floria RavelingStovall, Shana A wrote: CRM for notification. See Telephone encounter for: 04/08/18.   Pt would like to know or a suggestion from provider where he could go to see psychiatrist that takes medicaid?  He stated he was referred to one one time before?    Please advise -message sent to Dr Clelia CroftShaw

## 2018-04-08 NOTE — Telephone Encounter (Addendum)
Patient called, unable to leave VM, mailbox full. Attempted to call patient to discuss his need to increase dose of Xanax from 0.25 mg to 0.5 mg due to mild seizures from stress.

## 2018-04-10 ENCOUNTER — Ambulatory Visit: Payer: Commercial Managed Care - PPO | Admitting: Emergency Medicine

## 2018-04-13 NOTE — Telephone Encounter (Signed)
Pt message - req increase of Xanax dose. Having seizures from stress.  Unable to reach per [facility]

## 2018-04-18 ENCOUNTER — Telehealth: Payer: Self-pay

## 2018-04-18 ENCOUNTER — Other Ambulatory Visit: Payer: Self-pay | Admitting: Family Medicine

## 2018-04-18 NOTE — Telephone Encounter (Signed)
Copied from CRM [id number]. Topic: Quick Communication - Rx Refill/Question >> Apr 18, 2018 10:29 AM Arlyss Gandyichardson, Arpan Eskelson N, NT wrote: Medication: ALPRAZolam Prudy Feeler(XANAX) 0.25 MG tablet  Has the patient contacted their pharmacy? Yes.   (Agent: If no, request that the patient contact the pharmacy for the refill.) (Agent: If yes, when and what did the pharmacy advise?)  Preferred Pharmacy (with phone number or street name): [id number] [place], [place] - [address] [phone] (Phone) [phone] (Fax)      Agent: Please be advised that RX refills may take up to 3 business days. We ask that you follow-up with your pharmacy.

## 2018-04-18 NOTE — Telephone Encounter (Signed)
mychart message sent to pt with Dr. Leandro ReasonerShaws message

## 2018-04-18 NOTE — Telephone Encounter (Signed)
Xanax refill Last Refill:02/15/18 #60 Last OV: 02/15/18 PCP: Dr. Clelia CroftShaw [facility]  [address]

## 2018-04-18 NOTE — Telephone Encounter (Signed)
Sent Dr.Shaws message via Mychart

## 2018-04-18 NOTE — Telephone Encounter (Signed)
Needs OV to address this.

## 2018-04-18 NOTE — Telephone Encounter (Signed)
I [facility] put in a [facility] for [facility] - they take medicare. Other alternatives are to call the HelpLine - they might be able to tell him about other options.  He can also google it.  Or can call [facility] for appointment or go their walk-in "crisis" clinic.  You can call the 24-hour [facility] HelpLine at [phone] or [phone] for immediate assistance. Among several different types of services, they offer an Intensive oupatient program for mood disorders - which is a group type setting Monday-Friday 9-noon.  You can schedule an assessment by calling the above numbers during which the costs for the program and insurance benefits will be reviewed.    [facility] [address] [place], [place] [address] [phone]  Hour of operations: Monday-Friday, 8:30-5 p.m.  They take medicare/medicaid and the patient can contact [facility] referral department at [phone] or [email or web address] for more information.

## 2018-04-18 NOTE — Addendum Note (Signed)
Addended by: Sherren MochaSHAW, Meghana Tullo N on: 04/18/2018 12:45 PM   Modules accepted: Orders

## 2018-05-01 ENCOUNTER — Other Ambulatory Visit: Payer: Self-pay | Admitting: Family Medicine

## 2018-05-01 NOTE — Telephone Encounter (Signed)
LOV  02/15/18 Dr. Clelia CroftShaw Last refill 02/15/18 # 60 with 0 refill

## 2018-05-01 NOTE — Telephone Encounter (Signed)
Copied from CRM [id number]. Topic: Quick Communication - Rx Refill/Question >> May 01, 2018  9:32 AM Stephannie LiSimmons, Terek Bee L, NT wrote: Medication:ALPRAZolam Prudy Feeler(XANAX) 0.25 MG tablet  Has the patient contacted their pharmacy? no (Agent: If no, request that the patient contact the pharmacy for the refill (Agent: If yes, when and what did the pharmacy advise?  Preferred Pharmacy (with phone number or street name [id number] - [place], [place] - [address] [phone] (Phone) [phone] (Fax)      Agent: Please be advised that RX refills may take up to 3 business days. We ask that you follow-up with your pharmacy.

## 2018-05-08 ENCOUNTER — Other Ambulatory Visit: Payer: Self-pay

## 2018-05-08 ENCOUNTER — Encounter: Payer: Self-pay | Admitting: Family Medicine

## 2018-05-08 ENCOUNTER — Ambulatory Visit (INDEPENDENT_AMBULATORY_CARE_PROVIDER_SITE_OTHER): Payer: Commercial Managed Care - PPO | Admitting: Family Medicine

## 2018-05-08 VITALS — BP 102/72 | HR 51 | Temp 98.0°F | Resp 16 | Ht 70.08 in | Wt 154.0 lb

## 2018-05-08 DIAGNOSIS — Z79899 Other long term (current) drug therapy: Secondary | ICD-10-CM | POA: Diagnosis not present

## 2018-05-08 DIAGNOSIS — F411 Generalized anxiety disorder: Secondary | ICD-10-CM | POA: Diagnosis not present

## 2018-05-08 DIAGNOSIS — F1310 Sedative, hypnotic or anxiolytic abuse, uncomplicated: Secondary | ICD-10-CM

## 2018-05-08 DIAGNOSIS — F4010 Social phobia, unspecified: Secondary | ICD-10-CM | POA: Diagnosis not present

## 2018-05-08 DIAGNOSIS — F1910 Other psychoactive substance abuse, uncomplicated: Secondary | ICD-10-CM

## 2018-05-08 MED ORDER — ALPRAZOLAM 0.5 MG PO TABS: 0.5000 mg | ORAL_TABLET | Freq: Every day | ORAL | 5 refills | Status: DC | PRN

## 2018-05-08 NOTE — Patient Instructions (Addendum)
   IF you received an x-ray today, you will receive an invoice from [facility]. Please contact [facility] at [phone] with questions or concerns regarding your invoice.   IF you received labwork today, you will receive an invoice from [facility]. Please contact [facility] at [phone] with questions or concerns regarding your invoice.   Our billing staff will not be able to assist you with questions regarding bills from these companies.  You will be contacted with the lab results as soon as they are available. The fastest way to get your results is to activate your My Chart account. Instructions are located on the last page of this paperwork. If you have not heard from us regarding the results in 2 weeks, please contact this office.     Living With Anxiety After being diagnosed with an anxiety disorder, you may be relieved to know why you have felt or behaved a certain way. It is natural to also feel overwhelmed about the treatment ahead and what it will mean for your life. With care and support, you can manage this condition and recover from it. How to cope with anxiety Dealing with stress Stress is your body's reaction to life changes and events, both good and bad. Stress can last just a few hours or it can be ongoing. Stress can play a major role in anxiety, so it is important to learn both how to cope with stress and how to think about it differently. Talk with your health care provider or a counselor to learn more about stress reduction. He or she may suggest some stress reduction techniques, such as:  Music therapy. This can include creating or listening to music that you enjoy and that inspires you.  Mindfulness-based meditation. This involves being aware of your normal breaths, rather than trying to control your breathing. It can be done while sitting or walking.  Centering prayer. This is a kind of meditation that involves focusing on a word, phrase, or  sacred image that is meaningful to you and that brings you peace.  Deep breathing. To do this, expand your stomach and inhale slowly through your nose. Hold your breath for 3-5 seconds. Then exhale slowly, allowing your stomach muscles to relax.  Self-talk. This is a skill where you identify thought patterns that lead to anxiety reactions and correct those thoughts.  Muscle relaxation. This involves tensing muscles then relaxing them.  Choose a stress reduction technique that fits your lifestyle and personality. Stress reduction techniques take time and practice. Set aside 5-15 minutes a day to do them. Therapists can offer training in these techniques. The training may be covered by some insurance plans. Other things you can do to manage stress include:  Keeping a stress diary. This can help you learn what triggers your stress and ways to control your response.  Thinking about how you respond to certain situations. You may not be able to control everything, but you can control your reaction.  Making time for activities that help you relax, and not feeling guilty about spending your time in this way.  Therapy combined with coping and stress-reduction skills provides the best chance for successful treatment. Medicines Medicines can help ease symptoms. Medicines for anxiety include:  Anti-anxiety drugs.  Antidepressants.  Beta-blockers.  Medicines may be used as the main treatment for anxiety disorder, along with therapy, or if other treatments are not working. Medicines should be prescribed by a health care provider. Relationships Relationships can play a big part in   helping you recover. Try to spend more time connecting with trusted friends and family members. Consider going to couples counseling, taking family education classes, or going to family therapy. Therapy can help you and others better understand the condition. How to recognize changes in your condition Everyone has a  different response to treatment for anxiety. Recovery from anxiety happens when symptoms decrease and stop interfering with your daily activities at home or work. This may mean that you will start to:  Have better concentration and focus.  Sleep better.  Be less irritable.  Have more energy.  Have improved memory.  It is important to recognize when your condition is getting worse. Contact your health care provider if your symptoms interfere with home or work and you do not feel like your condition is improving. Where to find help and support: You can get help and support from these sources:  Self-help groups.  Online and community organizations.  A trusted spiritual leader.  Couples counseling.  Family education classes.  Family therapy.  Follow these instructions at home:  Eat a healthy diet that includes plenty of vegetables, fruits, whole grains, low-fat dairy products, and lean protein. Do not eat a lot of foods that are high in solid fats, added sugars, or salt.  Exercise. Most adults should do the following: ? Exercise for at least 150 minutes each week. The exercise should increase your heart rate and make you sweat (moderate-intensity exercise). ? Strengthening exercises at least twice a week.  Cut down on caffeine, tobacco, alcohol, and other potentially harmful substances.  Get the right amount and quality of sleep. Most adults need 7-9 hours of sleep each night.  Make choices that simplify your life.  Take over-the-counter and prescription medicines only as told by your health care provider.  Avoid caffeine, alcohol, and certain over-the-counter cold medicines. These may make you feel worse. Ask your pharmacist which medicines to avoid.  Keep all follow-up visits as told by your health care provider. This is important. Questions to ask your health care provider  Would I benefit from therapy?  How often should I follow up with a health care  provider?  How long do I need to take medicine?  Are there any long-term side effects of my medicine?  Are there any alternatives to taking medicine? Contact a health care provider if:  You have a hard time staying focused or finishing daily tasks.  You spend many hours a day feeling worried about everyday life.  You become exhausted by worry.  You start to have headaches, feel tense, or have nausea.  You urinate more than normal.  You have diarrhea. Get help right away if:  You have a racing heart and shortness of breath.  You have thoughts of hurting yourself or others. If you ever feel like you may hurt yourself or others, or have thoughts about taking your own life, get help right away. You can go to your nearest emergency department or call:  Your local emergency services (911 in the [place]  A suicide crisis helpline, such as the National Suicide Prevention Lifeline at 1-800-273-8255. This is open 24-hours a day.  Summary  Taking steps to deal with stress can help calm you.  Medicines cannot cure anxiety disorders, but they can help ease symptoms.  Family, friends, and partners can play a big part in helping you recover from an anxiety disorder. This information is not intended to replace advice given to you by your health care provider. Make   sure you discuss any questions you have with your health care provider. Document Released: 09/12/2016 Document Revised: 09/12/2016 Document Reviewed: 09/12/2016 Elsevier Interactive Patient Education  2018 [employer]  

## 2018-05-08 NOTE — Progress Notes (Signed)
Subjective:    Patient ID: Justin May, male    DOB: 12/23/1995, 22 y.o.   MRN: [id number] Chief Complaint  Patient presents with  . 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  Past few d has felt depressed, anxiety comes in spurts, some days just down, some days not able to manage it.  He starts to notice problems with eye contact - to much or not enough. But mood improved today.  Tried the bupropion and it made him less him - increased anxiety after a few days so just stopped.   Not drinking, not smoking - stopped yesterday - did have a sangria slushie a few days ago.  He would take 1mg  prior to work an doesn't want to do that forever but has more anxiety trying to advance his job, trying to get into [employer] so then transfer [employer], having more anxiety with upcoming court dates.   Past Medical History:  Diagnosis Date  . Anxiety   . Asthma   . Seizures (HCC)    History reviewed. No pertinent surgical history. Current Outpatient Medications on File Prior to Visit  Medication Sig Dispense Refill  . ALPRAZolam (XANAX) 0.25 MG tablet Take 1 tablet (0.25 mg total) by mouth at bedtime as needed for anxiety. 60 tablet 0  . buPROPion (WELLBUTRIN XL) 150 MG 24 hr tablet Take 1 tablet (150 mg total) by mouth daily. Increase to 2 tabs a day after 2 weeks. 60 tablet 1  . cetirizine-pseudoephedrine (ZYRTEC-D) 5-120 MG tablet Take 1 tablet by mouth 2 (two) times daily. 28 tablet 1  . traZODone (DESYREL) 50 MG tablet Take 1-2 tablets (50-100 mg total) by mouth at bedtime. 90 tablet 1  . triamcinolone (NASACORT) 55 MCG/ACT AERO nasal inhaler Place 2 sprays into the nose daily. 1 Inhaler 12   No current facility-administered medications on file prior to visit.    No Known Allergies Family History  Problem Relation Age of Onset  . Hypertension Father      Social History   Socioeconomic History  . Marital status: Single    Spouse name: Not on file  . Number of children: Not on file  . Years of education: Not on file  . Highest education level: Not on file  Occupational History  . Not on file  Social Needs  . Financial resource strain: Not on file  . Food insecurity:    Worry: Not on file    Inability: Not on file  . Transportation needs:    Medical: Not on file    Non-medical: Not on file  Tobacco Use  . Smoking status: Current Every Day Smoker    Packs/day: 1.00    Types: Cigarettes  . Smokeless tobacco: Current User    Types: Chew  Substance and Sexual Activity  . Alcohol use: No    Alcohol/week: 0.6 oz    Types: 1 Cans of beer per week    Frequency: Never    Comment: patient states he has not drank in 2-3 months  . Drug use: Yes    Types: Marijuana  . Sexual activity: Not Currently  Lifestyle  . Physical activity:    Days per week: Not on file    Minutes per session: Not on file  . Stress: Not on file  Relationships  . Social connections:    Talks on phone: Not on file    Gets together: Not on file    Attends religious service: Not on file    Active member of club or organization: Not on file    Attends meetings of clubs or organizations: Not on file    Relationship status: Not on file  Other Topics Concern  . Not on file  Social History Narrative  . Not on file   Depression screen [facility] 2/9 05/08/2018 02/15/2018 11/16/2017 10/08/2017 09/07/2017  Decreased Interest 1 0 0 0 0  Down, Depressed, Hopeless 1 0 0 0 0  PHQ - 2 Score 2 0 0 0 0  Altered sleeping 0 - - - -  Tired, decreased energy 0 - - - -  Change in appetite 0 - - - -  Feeling bad or failure about yourself  1 - - - -  Trouble concentrating 0 - - - -  Moving slowly or fidgety/restless 0 - - - -  Suicidal thoughts 0 - - - -  PHQ-9 Score 3 - - - -  Difficult doing work/chores - - - - -    Review of Systems See hpi    Objective:   Physical Exam   Constitutional: He is oriented to person, place, and time. He appears well-developed and well-nourished. No distress.  HENT:  Head: Normocephalic and atraumatic.  Eyes: Pupils are equal, round, and reactive to light. Conjunctivae are normal. No scleral icterus.  Neck: Normal range of motion. Neck supple. No thyromegaly present.  Cardiovascular: Normal rate, regular rhythm, normal heart sounds and intact distal pulses.  Pulmonary/Chest: Effort normal and breath sounds normal. No respiratory distress.  Musculoskeletal: He exhibits no edema.  Lymphadenopathy:    He has no cervical adenopathy.  Neurological: He is alert and oriented to person, place, and time.  Skin: Skin is warm and dry. He is not diaphoretic.  Psychiatric: He has a normal mood and affect. His behavior is normal.      BP 102/72   Pulse (!) 51   Temp 98 F (36.7 C) (Oral)   Resp 16   Ht 5' 10.08" (1.78 m)   Wt 154 lb (69.9 kg)   SpO2 98%   BMI 22.05 kg/m      Assessment & Plan:   1. Anxiety state   2. Encounter for long-term (current) use of high-risk medication   3. Social phobia   4. Mild benzodiazepine use disorder (HCC)   5. Substance abuse, binge pattern (HCC)     Orders Placed This Encounter  Procedures  . ToxASSURE Select 13 (MW), Urine    Meds ordered this encounter  Medications  . ALPRAZolam (XANAX) 0.5 MG tablet    Sig: Take 1 tablet (0.5 mg total) by mouth daily as needed for anxiety.    Dispense:  30 tablet    Refill:  5    DO NOT REFILL SOONER THAN EVERY 28 DAYS    [name], MD, MPH Primary Care at [facility] [address] [place], [place]  [address] [phone] Office phone  [phone] Office fax   07/21/18 12:09 PM

## 2018-05-13 ENCOUNTER — Telehealth: Payer: Self-pay | Admitting: Family Medicine

## 2018-05-13 NOTE — Telephone Encounter (Signed)
Called pt. To reschedule appt. On 09/09/18. Neither number worked.

## 2018-05-14 LAB — TOXASSURE SELECT 13 (MW), URINE

## 2018-05-20 ENCOUNTER — Ambulatory Visit: Payer: Commercial Managed Care - PPO | Admitting: Family Medicine

## 2018-09-09 ENCOUNTER — Ambulatory Visit: Payer: Commercial Managed Care - PPO | Admitting: Family Medicine

## 2018-09-19 ENCOUNTER — Ambulatory Visit: Payer: Commercial Managed Care - PPO | Admitting: Family Medicine

## 2018-10-09 ENCOUNTER — Telehealth: Payer: Self-pay | Admitting: Family Medicine

## 2018-10-09 NOTE — Telephone Encounter (Signed)
MyChart message sent to patient about their appointment on 10/23/18 with Dr Clelia Croft

## 2018-10-14 ENCOUNTER — Ambulatory Visit: Payer: Commercial Managed Care - PPO | Admitting: Family Medicine

## 2018-10-23 ENCOUNTER — Ambulatory Visit: Payer: Commercial Managed Care - PPO | Admitting: Family Medicine

## 2018-10-31 ENCOUNTER — Encounter: Payer: Self-pay | Admitting: Family Medicine

## 2018-10-31 ENCOUNTER — Ambulatory Visit: Payer: Commercial Managed Care - PPO | Admitting: Family Medicine

## 2018-10-31 ENCOUNTER — Other Ambulatory Visit: Payer: Self-pay

## 2018-10-31 VITALS — BP 137/81 | HR 54 | Temp 98.5°F | Ht 70.5 in | Wt 146.2 lb

## 2018-10-31 DIAGNOSIS — F411 Generalized anxiety disorder: Secondary | ICD-10-CM | POA: Diagnosis not present

## 2018-10-31 DIAGNOSIS — Z79899 Other long term (current) drug therapy: Secondary | ICD-10-CM | POA: Diagnosis not present

## 2018-10-31 DIAGNOSIS — L42 Pityriasis rosea: Secondary | ICD-10-CM | POA: Diagnosis not present

## 2018-10-31 NOTE — Patient Instructions (Addendum)
Rash appears to be pityriasis rosea which is not concerning.  If it does itch or cause any irritation, you can use over-the-counter antihistamine such as Allegra, Claritin or Zyrtec.  I expect that rash to improve its own without any other treatment. Return to the clinic or go to the nearest emergency room if any of your symptoms worsen or new symptoms occur.   Regarding alprazolam, it appears you are slightly early for refill at this time, so should not be out just quite yet.  Additionally to change of that medication, I would recommend further discussion with Dr. Clelia CroftShaw and will send her a message.  Thank you for coming in today.  Pityriasis Rosea Pityriasis rosea is a rash that usually appears on the chest, abdomen, and back. It may also appear on the upper arms and upper legs. It usually begins as a single patch, and then more patches start to develop. The rash may cause mild itching, but it normally does not cause other problems. It usually goes away without treatment. However, it may take weeks or months for the rash to go away completely. What are the causes? The cause of this condition is not known. The condition does not spread from person to person (is not contagious). What increases the risk? This condition is more likely to develop in:  Persons aged 10-35 years.  Pregnant women. It is more common in the spring and fall seasons. What are the signs or symptoms? The main symptom of this condition is a rash.  The rash usually begins with a single oval patch that is larger than the ones that follow. This is called a herald patch. It generally appears a week or more before the rest of the rash appears.  When more patches start to develop, they spread quickly on the chest, abdomen, back, arms, and legs. These patches are smaller than the first one.  The patches that make up the rash are usually oval-shaped and pink or red in color. They are usually flat but may sometimes be raised so that  they can be felt with a finger. They may also be finely crinkled and have a scaly ring around the edge. Some people may have mild itching and nonspecific symptoms, such as:  Nausea.  Loss of appetite.  Difficulty concentrating.  Headache.  Irritability.  Sore throat.  Mild fever. How is this diagnosed? This condition may be diagnosed based on:  Your medical history and a physical exam.  Tests to rule out other causes. This may include blood tests or a test in which a small sample of skin is removed from the rash (biopsy) and checked in a lab. How is this treated?     Treatment is not usually needed for this condition. The rash will often go away on its own in 4-8 weeks. In some cases, a health care provider may recommend or prescribe medicine to reduce itching. Follow these instructions at home:  Take or apply over-the-counter and prescription medicines only as told by your health care provider.  Avoid scratching the affected areas of skin.  Do not take hot baths or use a sauna. Use only warm water when bathing or showering. Heat can increase itching. Adding cornstarch to your bath may help to relieve the itching.  Avoid exposure to the sun and other sources of UV light, such as tanning beds, as told by your health care provider. UV light may help the rash go away but may cause unwanted changes in skin color.  Keep all follow-up visits as told by your health care provider. This is important. Contact a health care provider if:  Your rash does not go away in 8 weeks.  Your rash gets much worse.  You have a fever.  You have swelling or pain in the rash area.  You have fluid, blood, or pus coming from the rash area. Summary  Pityriasis rosea is a rash that usually appears on the trunk of the body. It can also appear on the upper arms and upper legs.  The rash usually begins with a single oval patch (herald patch) that appears a week or more before the rest of the rash  appears. The herald patch is larger than the ones that follow.  The rash may cause mild itching, but it usually does not cause other problems. It usually goes away without treatment in 4-8 weeks.  In some cases, a health care provider may recommend or prescribe medicine to reduce itching. This information is not intended to replace advice given to you by your health care provider. Make sure you discuss any questions you have with your health care provider. Document Released: 10/25/2001 Document Revised: 09/17/2017 Document Reviewed: 09/17/2017 Elsevier Interactive Patient Education  [employer]2019 [employer]   If you have lab work done today you will be contacted with your lab results within the next 2 weeks.  If you have not heard from us then please contact us. The fastest way to get your results is to register for My Chart.   IF you received an x-ray today, you will receive an invoice from [facility]. Please contact [facility] at [phone] with questions or concerns regarding your invoice.   IF you received labwork today, you will receive an invoice from [facility]. Please contact [facility] at [phone] with questions or concerns regarding your invoice.   Our billing staff will not be able to assist you with questions regarding bills from these companies.  You will be contacted with the lab results as soon as they are available. The fastest way to get your results is to activate your My Chart account. Instructions are located on the last page of this paperwork. If you have not heard from us regarding the results in 2 weeks, please contact this office.

## 2018-10-31 NOTE — Progress Notes (Signed)
Subjective:    Patient ID: Justin May, male    DOB: [birth year], 23 y.o.   MRN: [id number]  HPI Justin May is a 23 y.o. male Presents today for: Chief Complaint  Patient presents with  . MEDICATION change request    would like to try the Klonopin insted of Xanax  . Rash    all over   pcp is Shawnee Knapp, MD  Rash: Noticed about 6 weeks ago on back. Girlfriend noticed on back initially - dry skin. Did not bother him too much. Moved form back to arms, to legs..  No pain/itching. No fever. Feels well otherwise.  Left chest tattoo has been peeling past few weeks. Had placed 1 mo ago.  2 other recent tattoos not bothering.  Tx: lotion. No medicated treatments.   History of anxiety.  Per last note with primary care provider in August 2019 Wellbutrin caused increased anxiety and made him feel less like himself.  Stopped it after a few days.  He denies drinking or smoking at that time.  Had some increased anxiety with upcoming court dates as well as planning on school changes, advancing his job.  History of substance abuse, social phobia, mild benzodiazepine use disorder noted at that visit.  He was continued on alprazolam 0.5 mg daily as needed for anxiety with #30, 5 refills.  Urine drug screen performed on August 7, no drugs detected.  Previous urine drug screen on May 17 positive for carboxy THC.  Controlled substance database (PDMP) reviewed as below.  It does appear he was consistently receiving prescriptions from July 2 through most recently on January 9 for #30 of alprazolam 0.5 mg.   Reports that he last filled meds in December. Thinks had filled in December or January? He has been out of xanax for about a month.   Ran out of meds about a month ago.  Reports taking 1 alprazolam every other day, sometimes 2 at a time. Would like to switch to Klonopin as may work longer as girlfriend on klonopin. Has tried multiple SSRI's, they make symptoms worse. Has not met with psychiatry -  concerned about cost.   No IDU, rare CBD oil vaping.   10/10/2018  1   05/08/2018  Alprazolam 0.5 MG Tablet  30.00 30 Ev Sha   [id number]   Wal [phone])   1  1.00 LME  Private Pay   [place]  09/07/2018  1   05/08/2018  Alprazolam 0.5 MG Tablet  30.00 30 Ev Sha   [id number]   Wal [phone])   0  1.00 LME  Private Pay   [place]  08/08/2018  1   05/08/2018  Alprazolam 0.5 MG Tablet  30.00 30 Ev Sha   [id number]   [place] ([id number])   3  1.00 LME  Comm Ins   [place]  07/08/2018  1   05/08/2018  Alprazolam 0.5 MG Tablet  30.00 30 Ev Sha   [id number]   [place] ([id number])   2  1.00 LME  Comm Ins   [place]  06/08/2018  1   05/08/2018  Alprazolam 0.5 MG Tablet  30.00 30 Ev Sha   [id number]   [place] ([id number])   1  1.00 LME  Comm Ins   [place]  05/08/2018  1   05/08/2018  Alprazolam 0.5 MG Tablet  30.00 30 Ev Sha   [id number]   [place] ([id number])   0  1.00 LME  Comm Ins   [place]  04/02/2018  1  02/15/2018  Alprazolam 0.25 MG Tablet  30.00 30 Ev Sha   [id number]   [place] ([id number])   1  0.50 LME  Comm Ins   [place]  02/15/2018  1                Patient Active Problem List   Diagnosis Date Noted  . Substance abuse, binge pattern ([place]) 11/19/2017  . Alcohol use disorder, moderate, dependence ([place])   . Alcohol use with alcohol-induced mood disorder ([place]) 07/01/2017  . 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 Tobacco use disorder 04/14/2017  . Mild benzodiazepine use disorder ([place]) 04/14/2017  . Alcohol abuse 04/14/2017  . Insomnia 04/14/2017  . Anxiety state 06/16/2016  . Social phobia 06/16/2016   Past Medical History:  Diagnosis Date  . Anxiety   . Asthma   . Seizures ([place])    No past surgical history on file. No Known Allergies Prior to Admission medications   Medication Sig Start Date End Date Taking? Authorizing Provider  ALPRAZolam Duanne Moron) 0.5 MG tablet Take 1 tablet (0.5 mg total) by mouth daily as needed for anxiety. 05/08/18  Yes Shawnee Knapp, MD   Social History   Socioeconomic History  . Marital status: Single    Spouse name: Not on  file  . Number of children: Not on file  . Years of education: Not on file  . Highest education level: Not on file  Occupational History  . Not on file  Social Needs  . Financial resource strain: Not on file  . Food insecurity:    Worry: Not on file    Inability: Not on file  . Transportation needs:    Medical: Not on file    Non-medical: Not on file  Tobacco Use  . Smoking status: Former Smoker    Packs/day: 1.00    Types: Cigarettes    Last attempt to quit: 09/30/2018    Years since quitting: 0.0  . Smokeless tobacco: Current User  Substance and Sexual Activity  . Alcohol use: No    Alcohol/week: 1.0 standard drinks    Types: 1 Cans of beer per week    Frequency: Never    Comment: patient states he has not drank in 2-3 months  . Drug use: Yes    Types: Marijuana  . Sexual activity: Not Currently  Lifestyle  . Physical activity:    Days per week: Not on file    Minutes per session: Not on file  . Stress: Not on file  Relationships  . Social connections:    Talks on phone: Not on file    Gets together: Not on file    Attends religious service: Not on file    Active member of club or organization: Not on file    Attends meetings of clubs or organizations: Not on file    Relationship status: Not on file  . Intimate partner violence:    Fear of current or ex partner: Not on file    Emotionally abused: Not on file    Physically abused: Not on file    Forced sexual activity: Not on file  Other Topics Concern  . Not on file  Social History Narrative  . Not on file    Review of Systems     Objective:   Physical Exam Constitutional:      General: He is not in acute distress.    Appearance: He is well-developed.  HENT:  Head: Normocephalic and atraumatic.  Cardiovascular:     Rate and Rhythm: Normal rate.  Pulmonary:     Effort: Pulmonary effort is normal.  Skin:    Comments: Multiple scattered oval/rounded lesions across back, upper arms, thighs.  Few  on abdomen.  No petechiae/purpura.  No hand lesions.  No oral mucosal lesions.  Neurological:     Mental Status: He is alert and oriented to person, place, and time.    Vitals:   10/31/18 1450  BP: 137/81  Pulse: (!) 54  Temp: 98.5 F (36.9 C)  TempSrc: Oral  SpO2: 99%  Weight: 146 lb 3.2 oz (66.3 kg)  Height: 5' 10.5" (1.791 m)       Assessment & Plan:   Justin May is a 23 y.o. male Chronic use of benzodiazepine for therapeutic purpose - Plan: ToxASSURE Select 14 (MW), Urine Generalized anxiety disorder - Plan: ToxASSURE Select 13 (MW), Urine  -Somewhat inconsistent history, as reported persistent use of benzodiazepine in the past, but last U tox assures did not indicate benzodiazepine detection.  Additionally he states he has run out of medication for a while, but did have filled January 9, and would be early for refill at this point.  Reports inability to take any SSRI, and has not followed up with psychiatry.  -Urine drug testing performed again, then will defer refills and further management to his primary care provider.  Additionally discussion regarding changed to Klonopin can also be discussed with his primary care provider.  Pityriasis rosea  -Diffuse rash consistent with pityriasis.  No red flags on exam/history, symptomatic care discussed if needed, but overall benign nature of rash was discussed with RTC precautions given  No orders of the defined types were placed in this encounter.  Patient Instructions   Rash appears to be pityriasis rosea which is not concerning.  If it does itch or cause any irritation, you can use over-the-counter antihistamine such as Allegra, Claritin or Zyrtec.  I expect that rash to improve its own without any other treatment. Return to the clinic or go to the nearest emergency room if any of your symptoms worsen or new symptoms occur.   Regarding alprazolam, it appears you are slightly early for refill at this time, so should not be  out just quite yet.  Additionally to change of that medication, I would recommend further discussion with Dr. Brigitte Pulse and will send her a message.  Thank you for coming in today.  Pityriasis Rosea Pityriasis rosea is a rash that usually appears on the chest, abdomen, and back. It may also appear on the upper arms and upper legs. It usually begins as a single patch, and then more patches start to develop. The rash may cause mild itching, but it normally does not cause other problems. It usually goes away without treatment. However, it may take weeks or months for the rash to go away completely. What are the causes? The cause of this condition is not known. The condition does not spread from person to person (is not contagious). What increases the risk? This condition is more likely to develop in:  Persons aged 10-35 years.  Pregnant women. It is more common in the spring and fall seasons. What are the signs or symptoms? The main symptom of this condition is a rash.  The rash usually begins with a single oval patch that is larger than the ones that follow. This is called a herald patch. It generally appears a week or more  before the rest of the rash appears.  When more patches start to develop, they spread quickly on the chest, abdomen, back, arms, and legs. These patches are smaller than the first one.  The patches that make up the rash are usually oval-shaped and pink or red in color. They are usually flat but may sometimes be raised so that they can be felt with a finger. They may also be finely crinkled and have a scaly ring around the edge. Some people may have mild itching and nonspecific symptoms, such as:  Nausea.  Loss of appetite.  Difficulty concentrating.  Headache.  Irritability.  Sore throat.  Mild fever. How is this diagnosed? This condition may be diagnosed based on:  Your medical history and a physical exam.  Tests to rule out other causes. This may include  blood tests or a test in which a small sample of skin is removed from the rash (biopsy) and checked in a lab. How is this treated?     Treatment is not usually needed for this condition. The rash will often go away on its own in 4-8 weeks. In some cases, a health care provider may recommend or prescribe medicine to reduce itching. Follow these instructions at home:  Take or apply over-the-counter and prescription medicines only as told by your health care provider.  Avoid scratching the affected areas of skin.  Do not take hot baths or use a sauna. Use only warm water when bathing or showering. Heat can increase itching. Adding cornstarch to your bath may help to relieve the itching.  Avoid exposure to the sun and other sources of UV light, such as tanning beds, as told by your health care provider. UV light may help the rash go away but may cause unwanted changes in skin color.  Keep all follow-up visits as told by your health care provider. This is important. Contact a health care provider if:  Your rash does not go away in 8 weeks.  Your rash gets much worse.  You have a fever.  You have swelling or pain in the rash area.  You have fluid, blood, or pus coming from the rash area. Summary  Pityriasis rosea is a rash that usually appears on the trunk of the body. It can also appear on the upper arms and upper legs.  The rash usually begins with a single oval patch (herald patch) that appears a week or more before the rest of the rash appears. The herald patch is larger than the ones that follow.  The rash may cause mild itching, but it usually does not cause other problems. It usually goes away without treatment in 4-8 weeks.  In some cases, a health care provider may recommend or prescribe medicine to reduce itching. This information is not intended to replace advice given to you by your health care provider. Make sure you discuss any questions you have with your health care  provider. Document Released: 10/25/2001 Document Revised: 09/17/2017 Document Reviewed: 09/17/2017 Elsevier Interactive Patient Education  [employer].   If you have lab work done today you will be contacted with your lab results within the next 2 weeks.  If you have not heard from [place] then please contact us. The fastest way to get your results is to register for My Chart.   IF you received an x-ray today, you will receive an invoice from [facility]. Please contact [facility] at [phone] with questions or concerns regarding your invoice.  IF you received labwork today, you will receive an invoice from [facility]. Please contact [facility] at [phone] with questions or concerns regarding your invoice.   Our billing staff will not be able to assist you with questions regarding bills from these companies.  You will be contacted with the lab results as soon as they are available. The fastest way to get your results is to activate your My Chart account. Instructions are located on the last page of this paperwork. If you have not heard from [place] regarding the results in 2 weeks, please contact this office.       Signed,   Merri Ray, MD [facility].  11/04/18 8:39 AM

## 2018-11-04 ENCOUNTER — Encounter: Payer: Self-pay | Admitting: Family Medicine

## 2018-11-07 LAB — TOXASSURE SELECT 13 (MW), URINE

## 2018-11-08 MED ORDER — CLONAZEPAM 0.5 MG PO TABS: 0.5000 mg | ORAL_TABLET | Freq: Two times a day (BID) | ORAL | 1 refills | Status: DC | PRN

## 2019-04-15 ENCOUNTER — Ambulatory Visit (INDEPENDENT_AMBULATORY_CARE_PROVIDER_SITE_OTHER): Payer: Commercial Managed Care - PPO | Admitting: Family Medicine

## 2019-04-15 ENCOUNTER — Encounter: Payer: Self-pay | Admitting: Family Medicine

## 2019-04-15 ENCOUNTER — Other Ambulatory Visit (INDEPENDENT_AMBULATORY_CARE_PROVIDER_SITE_OTHER): Payer: Self-pay | Admitting: Family Medicine

## 2019-04-15 ENCOUNTER — Other Ambulatory Visit: Payer: Self-pay

## 2019-04-15 VITALS — BP 116/61 | HR 40 | Temp 97.5°F | Ht 69.25 in | Wt 143.2 lb

## 2019-04-15 DIAGNOSIS — R21 Rash and other nonspecific skin eruption: Secondary | ICD-10-CM

## 2019-04-15 MED ORDER — DOXYCYCLINE HYCLATE 100 MG PO CAPS: 100.0000 mg | ORAL_CAPSULE | Freq: Two times a day (BID) | ORAL | 0 refills | Status: DC

## 2019-04-15 MED ORDER — HYDROCORTISONE 2.5 % EX CREA: TOPICAL_CREAM | Freq: Two times a day (BID) | CUTANEOUS | 0 refills | Status: DC | PRN

## 2019-04-15 NOTE — Progress Notes (Signed)
Office Visit Note   Patient: Justin May           Date of Gaylyn LambertBirth: 10/08/1995           MRN: [id number] Visit Date: 04/15/2019 Requested by: Sherren MochaShaw, Eva N, MD No address on file PCP: Sherren MochaShaw, Eva N, MD  Subjective: Chief Complaint  Patient presents with  . rash on genitals (PCP has retired or moved)    HPI: He is here to establish care.  Several of his family members are patients of our clinic.  His previous PCP is no longer at the same practice.  Patient is mainly here for a rash on his genitalia.  For about a week he has had a scaly, itchy rash on the end of his penis.  Since he was a teenager he has had this happen multiple times.  Sometimes he uses oral antibiotics and sometimes topical steroid.  He is sometimes sexually active with his girlfriend and she had some sort of bacterial infection a few weeks ago but they have not had intercourse recently.  Patient does have a little bit of burning with urination due to the irritation of his rash.  He gets a similar rash on his hands sometimes.               ROS: Denies fevers or chills.  Denies purulent penile discharge.  Denies any history of previous STD.  All other systems were reviewed and are negative.  Objective: Vital Signs: BP 116/61 (BP Location: Left Arm, Patient Position: Sitting, Cuff Size: Normal)   Pulse (!) 40   Temp (!) 97.5 F (36.4 C)   Ht 5' 9.25" (1.759 m)   Wt 143 lb 4 oz (65 kg)   BMI 21.00 kg/m   Physical Exam:  General:  Alert and oriented, in no acute distress. Pulm:  Breathing unlabored. Psy:  Normal mood, congruent affect. Skin: On the tip of his penis he has a macerated rash with no sign of cellulitis.  It is slightly scaly.  He has no penile discharge. GU: He has slightly enlarged inguinal lymph nodes bilaterally.  No testicular mass, no epididymis tenderness.  Imaging: None today.  Assessment & Plan: 1.  Penile rash, etiology uncertain.  Question eczema.  Cannot rule out STD. -We will refer  him for STD screening.  Doxycycline empirically.  Short-term use of hydrocortisone 2.5%.  Follow-up as needed for this.     Procedures: No procedures performed  No notes on file     PMFS History: Patient Active Problem List   Diagnosis Date Noted  . Substance abuse, binge pattern ([facility]) 11/19/2017  . Alcohol use disorder, moderate, dependence ([facility])   . Alcohol use with alcohol-induced mood disorder ([facility]) 07/01/2017  . 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 Tobacco use disorder 04/14/2017  . Mild benzodiazepine use disorder ([facility]) 04/14/2017  . Alcohol abuse 04/14/2017  . Insomnia 04/14/2017  . Anxiety state 06/16/2016  . Social phobia 06/16/2016   Past Medical History:  Diagnosis Date  . Anxiety   . Asthma   . Seizures ([facility])     Family History  Problem Relation Age of Onset  . Hypertension Father     History reviewed. No pertinent surgical history. Social History   Occupational History  . Not on file  Tobacco Use  . Smoking status: Former Smoker    Packs/day: 1.00    Types: Cigarettes    Quit date: 09/30/2018  Years since quitting: 0.5  . Smokeless tobacco: Current User  Substance and Sexual Activity  . Alcohol use: No    Alcohol/week: 1.0 standard drinks    Types: 1 Cans of beer per week    Frequency: Never    Comment: patient states he has not drank in 2-3 months  . Drug use: Yes    Types: Marijuana  . Sexual activity: Not Currently

## 2019-04-16 LAB — C. TRACHOMATIS/N. GONORRHOEAE RNA
C. trachomatis RNA, TMA: NOT DETECTED
N. gonorrhoeae RNA, TMA: NOT DETECTED

## 2019-04-16 LAB — HIV ANTIBODY (ROUTINE TESTING W REFLEX): HIV 1&2 Ab, 4th Generation: NONREACTIVE

## 2019-04-16 LAB — RPR: RPR Ser Ql: NONREACTIVE

## 2019-04-17 ENCOUNTER — Telehealth: Payer: Self-pay | Admitting: Family Medicine

## 2019-04-17 NOTE — Telephone Encounter (Signed)
STD labs were negative/normal.

## 2019-07-05 ENCOUNTER — Ambulatory Visit (HOSPITAL_COMMUNITY)
Admission: RE | Admit: 2019-07-05 | Discharge: 2019-07-05 | Disposition: A | Discharge status: Home / Self Care | Payer: Commercial Managed Care - PPO | Attending: Psychiatry | Admitting: Psychiatry

## 2019-07-05 DIAGNOSIS — F411 Generalized anxiety disorder: Secondary | ICD-10-CM | POA: Insufficient documentation

## 2019-07-05 DIAGNOSIS — Z87891 Personal history of nicotine dependence: Secondary | ICD-10-CM | POA: Diagnosis not present

## 2019-07-05 DIAGNOSIS — J45909 Unspecified asthma, uncomplicated: Secondary | ICD-10-CM | POA: Insufficient documentation

## 2019-07-05 DIAGNOSIS — F329 Major depressive disorder, single episode, unspecified: Secondary | ICD-10-CM | POA: Diagnosis not present
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
## 2019-07-10 ENCOUNTER — Emergency Department (HOSPITAL_COMMUNITY): Payer: Commercial Managed Care - PPO

## 2019-07-10 ENCOUNTER — Other Ambulatory Visit: Payer: Self-pay

## 2019-07-10 ENCOUNTER — Encounter (HOSPITAL_COMMUNITY): Payer: Self-pay | Admitting: Emergency Medicine

## 2019-07-10 ENCOUNTER — Emergency Department (HOSPITAL_COMMUNITY)
Admission: EM | Admit: 2019-07-10 | Discharge: 2019-07-10 | Disposition: A | Discharge status: Home / Self Care | Payer: Commercial Managed Care - PPO | Attending: Emergency Medicine | Admitting: Emergency Medicine

## 2019-07-10 DIAGNOSIS — F17290 Nicotine dependence, other tobacco product, uncomplicated: Secondary | ICD-10-CM | POA: Diagnosis not present

## 2019-07-10 DIAGNOSIS — R42 Dizziness and giddiness: Secondary | ICD-10-CM | POA: Insufficient documentation

## 2019-07-10 DIAGNOSIS — R569 Unspecified convulsions: Secondary | ICD-10-CM

## 2019-07-10 DIAGNOSIS — R197 Diarrhea, unspecified: Secondary | ICD-10-CM | POA: Insufficient documentation

## 2019-07-10 DIAGNOSIS — J45909 Unspecified asthma, uncomplicated: Secondary | ICD-10-CM | POA: Insufficient documentation

## 2019-07-10 DIAGNOSIS — Z79899 Other long term (current) drug therapy: Secondary | ICD-10-CM | POA: Diagnosis not present

## 2019-07-10 LAB — URINALYSIS, ROUTINE W REFLEX MICROSCOPIC
Bacteria, UA: NONE SEEN
Bilirubin Urine: NEGATIVE
Glucose, UA: NEGATIVE mg/dL
Hgb urine dipstick: NEGATIVE
Ketones, ur: 5 mg/dL — AB
Leukocytes,Ua: NEGATIVE
Nitrite: NEGATIVE
Protein, ur: 30 mg/dL — AB
Specific Gravity, Urine: 1.017 (ref 1.005–1.030)
pH: 6 (ref 5.0–8.0)

## 2019-07-10 LAB — COMPREHENSIVE METABOLIC PANEL
ALT: 23 U/L (ref 0–44)
AST: 29 U/L (ref 15–41)
Albumin: 4.5 g/dL (ref 3.5–5.0)
Alkaline Phosphatase: 65 U/L (ref 38–126)
Anion gap: 12 (ref 5–15)
BUN: 9 mg/dL (ref 6–20)
CO2: 27 mmol/L (ref 22–32)
Calcium: 9.4 mg/dL (ref 8.9–10.3)
Chloride: 100 mmol/L (ref 98–111)
Creatinine, Ser: 1.15 mg/dL (ref 0.61–1.24)
GFR calc Af Amer: >60 mL/min (ref >60)
GFR calc non Af Amer: >60 mL/min (ref >60)
Glucose, Bld: 83 mg/dL (ref 70–99)
Potassium: 3.1 mmol/L — ABNORMAL LOW (ref 3.5–5.1)
Sodium: 139 mmol/L (ref 135–145)
Total Bilirubin: 0.9 mg/dL (ref 0.3–1.2)
Total Protein: 6.8 g/dL (ref 6.5–8.1)

## 2019-07-10 LAB — CBC WITH DIFFERENTIAL/PLATELET
Abs Immature Granulocytes: 0.02 10*3/uL (ref 0.00–0.07)
Basophils Absolute: 0 10*3/uL (ref 0.0–0.1)
Basophils Relative: 1 %
Eosinophils Absolute: 0.3 10*3/uL (ref 0.0–0.5)
Eosinophils Relative: 5 %
HCT: 45.3 % (ref 39.0–52.0)
Hemoglobin: 15.4 g/dL (ref 13.0–17.0)
Immature Granulocytes: 0 %
Lymphocytes Relative: 35 %
Lymphs Abs: 2.2 10*3/uL (ref 0.7–4.0)
MCH: 30.4 pg (ref 26.0–34.0)
MCHC: 34 g/dL (ref 30.0–36.0)
MCV: 89.5 fL (ref 80.0–100.0)
Monocytes Absolute: 0.5 10*3/uL (ref 0.1–1.0)
Monocytes Relative: 7 %
Neutro Abs: 3.2 10*3/uL (ref 1.7–7.7)
Neutrophils Relative %: 52 %
Platelets: 225 10*3/uL (ref 150–400)
RBC: 5.06 MIL/uL (ref 4.22–5.81)
RDW: 12.8 % (ref 11.5–15.5)
WBC: 6.3 10*3/uL (ref 4.0–10.5)
nRBC: 0 % (ref 0.0–0.2)

## 2019-07-10 LAB — RAPID URINE DRUG SCREEN, HOSP PERFORMED
Amphetamines: NOT DETECTED
Barbiturates: NOT DETECTED
Benzodiazepines: POSITIVE — AB
Cocaine: NOT DETECTED
Opiates: NOT DETECTED
Tetrahydrocannabinol: POSITIVE — AB

## 2019-07-10 LAB — ETHANOL: Alcohol, Ethyl (B): <10 mg/dL (ref <10)

## 2019-07-10 LAB — CBG MONITORING, ED: Glucose-Capillary: 79 mg/dL (ref 70–99)

## 2019-07-10 MED ORDER — POTASSIUM CHLORIDE CRYS ER 20 MEQ PO TBCR
40.0000 meq | EXTENDED_RELEASE_TABLET | Freq: Once | ORAL | Status: AC
  Administered 2019-07-10: 40 meq via ORAL
  Filled 2019-07-10: qty 2

## 2019-07-10 MED ORDER — LEVETIRACETAM 500 MG PO TABS: 500.0000 mg | ORAL_TABLET | Freq: Two times a day (BID) | ORAL | 1 refills | Status: DC

## 2019-07-10 MED ORDER — LEVETIRACETAM IN NACL 1000 MG/100ML IV SOLN
1000.0000 mg | Freq: Once | INTRAVENOUS | Status: AC
  Administered 2019-07-10: 1000 mg via INTRAVENOUS
  Filled 2019-07-10: qty 100

## 2019-07-10 NOTE — Discharge Instructions (Addendum)
You cannot drive for at least 6 months or until you have been cleared by a neurologist.  Please do not perform any activities that may be dangerous to yourself or others if you were to have another seizure.

## 2019-07-10 NOTE — ED Triage Notes (Signed)
  Patient BIB EMS for seizure that occurred around 2330.  Family states patient was laying on couch and had tonic/clonic seizure lasting about 2 mins.  Patient has hx of seizures but is not on medication.  Patient states he was told his seizures were from lack of sleep and stress.  Patient is A&O x4.  Patient was ambulatory from stretcher to bed on arrival.  CBG 100.

## 2019-07-10 NOTE — ED Provider Notes (Signed)
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  Spoke to the grandmother by phone who states that the patient had a tonic-clonic seizure where he had movement of all extremities and stiffening of his extremities intermittently for approximately 4 to 5 minutes.  Was postictal afterwards.  No incontinence or tongue biting.  Patient states before this happened he felt very hot and dizzy.  He has had 1 previous seizure in 2018 that was thought secondary to Ativan withdrawal.  He is on Xanax 0.5 mg and states he takes no more than 2 a day.  This medication was refilled on 07/02/2019 he states he still has plenty of it.  He has been taking it regularly and has not had any days when he has missed a dose of his Xanax.  No head injury, headache, numbness, tingling or weakness.  No recent fevers, cough, nausea or vomiting.  States he has had some mild diarrhea.  Has never seen a neurologist.  Is not on antiepileptics.  ROS: See HPI Constitutional: no fever  Eyes: no drainage  ENT: no runny nose   Cardiovascular:  no chest pain  Resp: no SOB  GI: no vomiting GU: no dysuria Integumentary: no rash  Allergy: no hives  Musculoskeletal: no leg swelling  Neurological: no slurred speech ROS otherwise negative  PAST MEDICAL HISTORY/PAST SURGICAL HISTORY:  Past Medical History:  Diagnosis Date  . Anxiety   . Asthma   . Seizures ([place])     MEDICATIONS:  Prior to Admission medications   Medication Sig Start Date End Date Taking? Authorizing Provider  clonazePAM (KLONOPIN) 1 MG tablet TK 1 T PO BID 04/11/19   [provider]  doxycycline (VIBRAMYCIN) 100 MG capsule Take 1 capsule (100 mg  total) by mouth 2 (two) times daily. 04/15/19   Hilts, Legrand Como, MD  hydrocortisone 2.5 % cream Apply topically 2 (two) times daily as needed. 04/15/19   Hilts, Legrand Como, MD  mirtazapine (REMERON) 30 MG tablet TK 1 T PO HS 02/25/19   [provider]    ALLERGIES:  No Known Allergies  SOCIAL HISTORY:  Social History   Tobacco Use  . Smoking status: Former Smoker    Packs/day: 1.00    Types: Cigarettes    Quit date: 09/30/2018    Years since quitting: 0.7  . Smokeless tobacco: Current User  Substance Use Topics  . Alcohol use: No    Alcohol/week: 1.0 standard drinks    Types: 1 Cans of beer per week    Frequency: Never    Comment: patient states he has not drank in 2-3 months    FAMILY HISTORY: Family History  Problem Relation Age of Onset  . Hypertension Father     EXAM: BP (!) 157/89 (BP Location: Left Arm)   Pulse (!) 59   Temp 98.3 F (36.8 C) (Oral)   Resp 20   Ht 5\' 10"  (1.778 m)   Wt 65.8 kg   SpO2 100%   BMI 20.81 kg/m  CONSTITUTIONAL: Alert and oriented x 3 and responds appropriately to questions. Well-appearing; well-nourished; GCS 15 HEAD: Normocephalic; atraumatic EYES: Conjunctivae clear, PERRL, EOMI ENT: normal nose; no rhinorrhea; moist mucous membranes; pharynx without lesions noted; no dental injury;  no septal hematoma NECK: Supple, no meningismus, no LAD; no midline spinal tenderness, step-off or deformity; trachea midline CARD: RRR; S1 and S2 appreciated; no murmurs, no clicks, no rubs, no gallops RESP: Normal chest excursion without splinting or tachypnea; breath sounds clear and equal bilaterally; no wheezes, no rhonchi, no rales; no hypoxia or respiratory distress CHEST:  chest wall stable, no crepitus or ecchymosis or deformity, nontender to palpation; no flail chest ABD/GI: Normal bowel sounds; non-distended; soft, non-tender, no rebound, no guarding; no ecchymosis or other lesions noted PELVIS:  stable, nontender to palpation BACK:  The  back appears normal and is non-tender to palpation, there is no CVA tenderness; no midline spinal tenderness, step-off or deformity EXT: Normal ROM in all joints; non-tender to palpation; no edema; normal capillary refill; no cyanosis, no bony tenderness or bony deformity of patient's extremities, no joint effusion, compartments are soft, extremities are warm and well-perfused, no ecchymosis SKIN: Normal color for age and race; warm NEURO: Moves all extremities equally, sensation to light touch intact diffusely, cranial nerves II through XII intact, normal speech, strength 5/5 in all 4 extremities PSYCH: The patient's mood and manner are appropriate. Grooming and personal hygiene are appropriate.  MEDICAL DECISION MAKING: Patient here with seizure.  It does not seem that this was related to benzodiazepine withdrawal.  He does not currently take antiepileptics or see a neurologist.  Will obtain labs, urine, head CT.  Blood glucose, EKG normal.  Anticipate discussion with neurology for recommendations and outpatient follow-up as long as patient remains neurologically intact with no repeat seizure-like activity.  ED PROGRESS: Labs unremarkable other than potassium level of 3.1.  His heart rate is intermittently in the low 40s which he reports is normal for him.  Drug screen positive for benzodiazepines and THC.  Discussed patient's case with Dr. Otelia Limes on-call for neurology.  He agrees with outpatient neurology follow-up and starting the patient on Keppra 500 mg twice daily with 1000 mg IV load here in the emergency department.  Updated patient and mother at bedside and they are comfortable with this plan.  We have discussed at length that he should not drive for at least 6 months.   At this time, I do not feel there is any life-threatening condition present. I have reviewed and discussed all results (EKG, imaging, lab, urine as appropriate) and exam findings with patient/family. I have reviewed nursing  notes and appropriate previous records.  I feel the patient is safe to be discharged home without further emergent workup and can continue workup as an outpatient as needed. Discussed usual and customary return precautions. Patient/family verbalize understanding and are comfortable with this plan.  Outpatient follow-up has been provided as needed. All questions have been answered.     EKG Interpretation  Date/Time:  Thursday July 10 2019 00:32:40 EDT Ventricular Rate:  58 PR Interval:    QRS Duration: 112 QT Interval:  431 QTC Calculation: 424 R Axis:   86 Text Interpretation:  Sinus rhythm Borderline intraventricular conduction delay No significant change since last tracing in October 2018 Confirmed by Rochele Raring [id number]) on 07/10/2019 12:47:18 AM         EKG Interpretation  Date/Time:  Thursday July 10 2019 03:07:28 EDT Ventricular Rate:  40 PR Interval:    QRS Duration: 116 QT Interval:  474 QTC Calculation: 387 R Axis:   94 Text Interpretation:  Sinus bradycardia Nonspecific intraventricular conduction delay No significant change since last tracing Confirmed by Rochele Raring [id number]) on 07/10/2019  3:17:08 AM         Gaylyn LambertAdam H Crute was evaluated in Emergency Department on 07/10/2019 for the symptoms described in the history of present illness. He was evaluated in the context of the global COVID-19 pandemic, which necessitated consideration that the patient might be at risk for infection with the SARS-CoV-2 virus that causes COVID-19. Institutional protocols and algorithms that pertain to the evaluation of patients at risk for COVID-19 are in a state of rapid change based on information released by regulatory bodies including the CDC and federal and state organizations. These policies and algorithms were followed during the patient's care in the ED.     Nate Common, Layla MawKristen N, DO 07/10/19 [phone]

## 2019-07-24 ENCOUNTER — Other Ambulatory Visit: Payer: Self-pay

## 2019-07-24 ENCOUNTER — Encounter: Payer: Self-pay | Admitting: Neurology

## 2019-07-24 ENCOUNTER — Ambulatory Visit: Payer: Commercial Managed Care - PPO | Admitting: Neurology

## 2019-07-24 DIAGNOSIS — G40909 Epilepsy, unspecified, not intractable, without status epilepticus: Secondary | ICD-10-CM | POA: Insufficient documentation

## 2019-07-24 HISTORY — DX: Epilepsy, unspecified, not intractable, without status epilepticus: G40.909
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
## 2019-07-30 ENCOUNTER — Telehealth: Payer: Self-pay | Admitting: Neurology

## 2019-07-30 NOTE — Telephone Encounter (Signed)
LVM for pt to call back about scheduling mri  [facility] Auth: [id number] (exp. 07/29/19 to 08/27/19)

## 2019-08-04 ENCOUNTER — Other Ambulatory Visit: Payer: Self-pay

## 2019-08-04 ENCOUNTER — Encounter: Payer: Self-pay | Admitting: Registered Nurse

## 2019-08-04 ENCOUNTER — Ambulatory Visit: Payer: Commercial Managed Care - PPO | Admitting: Registered Nurse

## 2019-08-04 VITALS — BP 130/84 | HR 60 | Temp 98.4°F | Resp 16 | Ht 70.0 in | Wt 143.0 lb

## 2019-08-04 DIAGNOSIS — S46911A Strain of unspecified muscle, fascia and tendon at shoulder and upper arm level, right arm, initial encounter: Secondary | ICD-10-CM

## 2019-08-04 MED ORDER — METHOCARBAMOL 750 MG PO TABS: 750.0000 mg | ORAL_TABLET | Freq: Three times a day (TID) | ORAL | 2 refills | Status: DC

## 2019-08-04 MED ORDER — MELOXICAM 15 MG PO TABS: 15.0000 mg | ORAL_TABLET | Freq: Every day | ORAL | 1 refills | Status: DC

## 2019-08-04 NOTE — Progress Notes (Signed)
Established Patient Office Visit  Subjective:  Patient ID: Justin May, male    DOB: Oct 31, 1995  Age: 23 y.o. MRN: [id number]  CC:  Chief Complaint  Patient presents with  . 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 Denies alleviating factors, notes it is relieved by rest and stretching Has not tried treatment.  Past Medical History:  Diagnosis Date  . ADHD   . Anxiety   . Asthma   . Seizure disorder (HCC) 07/24/2019  . Seizures (HCC)     No past surgical history on file.  Family History  Problem Relation Age of Onset  . Hypertension Father     Social History   Socioeconomic History  . Marital status: Single    Spouse name: Not on file  . Number of children: Not on file  . Years of education: Not on file  . Highest education level: Not on file  Occupational History  . Not on file  Social Needs  . Financial resource strain: Not on file  . Food insecurity    Worry: Not on file    Inability: Not on file  . Transportation needs    Medical: Not on file    Non-medical: Not on file  Tobacco Use  . Smoking status: Light Tobacco Smoker    Packs/day: 1.00    Types: Cigarettes    Last attempt to quit: 09/30/2018    Years since quitting: 0.8  . Smokeless tobacco: Current User  Substance and Sexual Activity  . Alcohol use: No    Alcohol/week: 1.0 standard drinks    Types: 1 Cans of beer per week    Frequency: Never    Comment: patient states he has not drank in 2-3 months  . Drug use: Yes    Types: Marijuana  . Sexual activity: Not Currently  Lifestyle  . Physical activity    Days per week: Not on  file    Minutes per session: Not on file  . Stress: Not on file  Relationships  . Social Musician on phone: Not on file    Gets together: Not on file    Attends religious service: Not on file    Active member of club or organization: Not on file    Attends meetings of clubs or organizations: Not on file    Relationship status: Not on file  . Intimate partner violence    Fear of current or ex partner: Not on file    Emotionally abused: Not on file    Physically abused: Not on file    Forced sexual activity: Not on file  Other Topics Concern  . Not on file  Social History Narrative  . Not on file    Outpatient Medications Prior to Visit  Medication Sig Dispense Refill  . amphetamine-dextroamphetamine (ADDERALL XR) 5 MG 24 hr capsule     . chlordiazePOXIDE (LIBRIUM) 25 MG capsule Take 25 mg by mouth daily.    . clonazePAM (KLONOPIN) 1 MG tablet 2 (two) times daily as needed.     . levETIRAcetam (  KEPPRA) 500 MG tablet Take 1 tablet (500 mg total) by mouth 2 (two) times daily. 60 tablet 1   No facility-administered medications prior to visit.     No Known Allergies  ROS Review of Systems Per hpi   Objective:    Physical Exam  Constitutional: He is oriented to person, place, and time. He appears well-developed and well-nourished. No distress.  Cardiovascular: Normal rate and regular rhythm.  Pulmonary/Chest: Effort normal. No respiratory distress.  Musculoskeletal: Normal range of motion.        General: Tenderness (posterior aspect of R shoulder) and edema (mild, posterior aspect of R shoulder) present. No deformity.  Neurological: He is alert and oriented to person, place, and time.  Skin: Skin is warm and dry. No rash noted. He is not diaphoretic. No erythema. No pallor.  Psychiatric: He has a normal mood and affect. His behavior is normal. Judgment and thought content normal.  Nursing note and vitals reviewed.   BP 130/84   Pulse 60   Temp 98.4 F (36.9  C) (Oral)   Resp 16   Ht 5\' 10"  (1.778 m)   Wt 143 lb (64.9 kg)   SpO2 100%   BMI 20.52 kg/m  Wt Readings from Last 3 Encounters:  08/04/19 143 lb (64.9 kg)  07/24/19 144 lb (65.3 kg)  07/10/19 145 lb (65.8 kg)     Health Maintenance Due  Topic Date Due  . INFLUENZA VACCINE  05/03/2019    There are no preventive care reminders to display for this patient.  Lab Results  Component Value Date   TSH 3.970 02/15/2018   Lab Results  Component Value Date   WBC 6.3 07/10/2019   HGB 15.4 07/10/2019   HCT 45.3 07/10/2019   MCV 89.5 07/10/2019   PLT 225 07/10/2019   Lab Results  Component Value Date   NA 139 07/10/2019   K 3.1 (L) 07/10/2019   CO2 27 07/10/2019   GLUCOSE 83 07/10/2019   BUN 9 07/10/2019   CREATININE 1.15 07/10/2019   BILITOT 0.9 07/10/2019   ALKPHOS 65 07/10/2019   AST 29 07/10/2019   ALT 23 07/10/2019   PROT 6.8 07/10/2019   ALBUMIN 4.5 07/10/2019   CALCIUM 9.4 07/10/2019   ANIONGAP 12 07/10/2019   No results found for: CHOL No results found for: HDL No results found for: LDLCALC No results found for: TRIG No results found for: CHOLHDL No results found for: [id number]    Assessment & Plan:   Problem List Items Addressed This Visit    None    Visit Diagnoses    Strain of right shoulder, initial encounter    -  Primary   Relevant Medications   meloxicam (MOBIC) 15 MG tablet   methocarbamol (ROBAXIN) 750 MG tablet   Other Relevant Orders   Ambulatory referral to Physical Therapy      Meds ordered this encounter  Medications  . meloxicam (MOBIC) 15 MG tablet    Sig: Take 1 tablet (15 mg total) by mouth daily.    Dispense:  30 tablet    Refill:  1    Order Specific Question:   Supervising Provider    Answer:   Collie SiadSTALLINGS, ZOE A [id number]]  . methocarbamol (ROBAXIN) 750 MG tablet    Sig: Take 1 tablet (750 mg total) by mouth 3 (three) times daily.    Dispense:  30 tablet    Refill:  2    Order Specific Question:   Supervising Provider  AnswerForrest Moron [[id number]]    Follow-up: No follow-ups on file.   PLAN  Meloxicam 15mg  PO qd, preferably take before work to avoid inflammation while working  Methocarbamol 750mg  PO tid PRN, preferably take after work  RICE method discussed. Patient demonstrates understanding  PT referral sent: Pt would benefit from PT workup  Patient encouraged to call clinic with any questions, comments, or concerns.   Maximiano Coss, NP

## 2019-08-04 NOTE — Patient Instructions (Signed)
° ° ° °  If you have lab work done today you will be contacted with your lab results within the next 2 weeks.  If you have not heard from us then please contact us. The fastest way to get your results is to register for My Chart. ° ° °IF you received an x-ray today, you will receive an invoice from [facility]. Please contact [facility] at [phone] with questions or concerns regarding your invoice.  ° °IF you received labwork today, you will receive an invoice from [facility]. Please contact [facility] at [phone] with questions or concerns regarding your invoice.  ° °Our billing staff will not be able to assist you with questions regarding bills from these companies. ° °You will be contacted with the lab results as soon as they are available. The fastest way to get your results is to activate your My Chart account. Instructions are located on the last page of this paperwork. If you have not heard from us regarding the results in 2 weeks, please contact this office. °  ° ° ° °

## 2019-08-13 ENCOUNTER — Other Ambulatory Visit: Payer: Commercial Managed Care - PPO

## 2019-08-26 ENCOUNTER — Other Ambulatory Visit: Payer: Self-pay

## 2019-08-26 ENCOUNTER — Telehealth (INDEPENDENT_AMBULATORY_CARE_PROVIDER_SITE_OTHER): Payer: Commercial Managed Care - PPO | Admitting: Registered Nurse

## 2019-08-26 ENCOUNTER — Ambulatory Visit: Payer: Self-pay | Admitting: *Deleted

## 2019-08-26 DIAGNOSIS — Z20822 Contact with and (suspected) exposure to covid-19: Secondary | ICD-10-CM

## 2019-08-26 DIAGNOSIS — Z20828 Contact with and (suspected) exposure to other viral communicable diseases: Secondary | ICD-10-CM | POA: Diagnosis not present

## 2019-08-26 NOTE — Progress Notes (Signed)
CC-Possible positive for covid- been around someone whom has covid. Past few days been tired, fatigue,sob,headache,chills, throat sore in the am. Think he may have covid and want to be tested for covid.
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
## 2019-08-26 NOTE — Telephone Encounter (Signed)
Copied from [place] [id number]. Topic: General - Inquiry >> Aug 26, 2019  2:25 PM Oneta Rack wrote: Patient experiencing lost of taste, shortness of breath and slurred speech transfer patient to Nurse Triage. Patient requesting Dr. Note excusing him from work today, please upload to My Chart

## 2019-08-26 NOTE — Progress Notes (Signed)
Telemedicine Encounter- SOAP NOTE Established Patient  This telephone encounter was conducted with the patient's (or proxy's) verbal consent via audio telecommunications: yes  Patient was instructed to have this encounter in a suitably private space; and to only have persons present to whom they give permission to participate. In addition, patient identity was confirmed by use of name plus two identifiers (DOB and address).  I discussed the limitations, risks, security and privacy concerns of performing an evaluation and management service by telephone and the availability of in person appointments. I also discussed with the patient that there may be a patient responsible charge related to this service. The patient expressed understanding and agreed to proceed.  I spent a total of 14 minutes  talking with the patient or their proxy.  No chief complaint on file.   Subjective   Justin May is a 23 y.o. established patient. Telephone visit today for COVID symptoms  HPI Notes known exposure Has been 2-3 days of fatigue, shob, headache, chills, sore throat in AM. No fever to his knowledge, no nvd.  Notes that he called out of work today due to his symptoms. Needs a work note.   Patient Active Problem List   Diagnosis Date Noted  . Seizure disorder ([place]) 07/24/2019  . Substance abuse, binge pattern ([place]) 11/19/2017  . Alcohol use disorder, moderate, dependence ([place])   . Alcohol use with alcohol-induced mood disorder ([place]) 07/01/2017  . 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 Tobacco use disorder 04/14/2017  . 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 Alcohol abuse 04/14/2017  . Insomnia 04/14/2017  . Anxiety state 06/16/2016  . Social phobia 06/16/2016    Past Medical History:  Diagnosis Date  . ADHD   . Anxiety   . Asthma   . Seizure disorder ([place]) 07/24/2019  . Seizures ([place])     Current Outpatient Medications   Medication Sig Dispense Refill  . chlordiazePOXIDE (LIBRIUM) 25 MG capsule Take 25 mg by mouth daily.    . clonazePAM (KLONOPIN) 1 MG tablet 2 (two) times daily as needed.     . levETIRAcetam (KEPPRA) 500 MG tablet Take 1 tablet (500 mg total) by mouth 2 (two) times daily. 60 tablet 1  . meloxicam (MOBIC) 15 MG tablet Take 1 tablet (15 mg total) by mouth daily. 30 tablet 1  . amphetamine-dextroamphetamine (ADDERALL XR) 5 MG 24 hr capsule      No current facility-administered medications for this visit.     No Known Allergies  Social History   Socioeconomic History  . Marital status: Single    Spouse name: Not on file  . Number of children: Not on file  . Years of education: Not on file  . Highest education level: Not on file  Occupational History  . Not on file  Social Needs  . Financial resource strain: Not on file  . Food insecurity    Worry: Not on file    Inability: Not on file  . Transportation needs    Medical: Not on file    Non-medical: Not on file  Tobacco Use  . Smoking status: Light Tobacco Smoker    Packs/day: 1.00    Types: Cigarettes    Last attempt to quit: 09/30/2018    Years since quitting: 0.9  . Smokeless tobacco: Current User  Substance and Sexual Activity  . Alcohol use: No    Alcohol/week: 1.0 standard drinks    Types: 1 Cans  of beer per week    Frequency: Never    Comment: patient states he has not drank in 2-3 months  . Drug use: Yes    Types: Marijuana  . Sexual activity: Not Currently  Lifestyle  . Physical activity    Days per week: Not on file    Minutes per session: Not on file  . Stress: Not on file  Relationships  . Social Musician on phone: Not on file    Gets together: Not on file    Attends religious service: Not on file    Active member of club or organization: Not on file    Attends meetings of clubs or organizations: Not on file    Relationship status: Not on file  . Intimate partner violence    Fear of  current or ex partner: Not on file    Emotionally abused: Not on file    Physically abused: Not on file    Forced sexual activity: Not on file  Other Topics Concern  . Not on file  Social History Narrative  . Not on file    ROS  Objective   Vitals as reported by the patient: There were no vitals filed for this visit.  Diagnoses and all orders for this visit:  Suspected COVID-19 virus infection   PLAN  Reviewed drive thru testing protocol  Will provide work note  Reviewed supportive care and self-isolation  Patient encouraged to call clinic with any questions, comments, or concerns.    I discussed the assessment and treatment plan with the patient. The patient was provided an opportunity to ask questions and all were answered. The patient agreed with the plan and demonstrated an understanding of the instructions.   The patient was advised to call back or seek an in-person evaluation if the symptoms worsen or if the condition fails to improve as anticipated.  I provided 14 minutes of non-face-to-face time during this encounter.  Janeece Agee, NP  Primary Care at [facility]

## 2019-08-27 ENCOUNTER — Other Ambulatory Visit: Payer: Self-pay

## 2019-08-27 ENCOUNTER — Encounter: Payer: Self-pay | Admitting: Registered Nurse

## 2019-08-27 ENCOUNTER — Telehealth: Payer: Self-pay | Admitting: Registered Nurse

## 2019-08-27 DIAGNOSIS — Z20822 Contact with and (suspected) exposure to covid-19: Secondary | ICD-10-CM

## 2019-08-27 NOTE — Telephone Encounter (Signed)
Copied from [place] [phone]. Topic: General - Inquiry >> Aug 26, 2019  2:25 PM Oneta Rack wrote: Patient experiencing lost of taste, shortness of breath and slurred speech transfer patient to Nurse Triage. Patient requesting Dr. Radene Ou excusing him from work today, please upload to My Chart >> Aug 27, 2019 12:38 PM Percell Belt A wrote: Pt called in and stated that he is needing a note for work that states he is being tested for covid.  He is going to call back with a fax number for his employer

## 2019-08-28 LAB — NOVEL CORONAVIRUS, NAA: SARS-CoV-2, NAA: NOT DETECTED

## 2019-09-10 NOTE — Telephone Encounter (Signed)
Request for work note was completed on 08/27/19.

## 2019-12-02 ENCOUNTER — Ambulatory Visit: Payer: Commercial Managed Care - PPO | Admitting: Neurology

## 2020-01-09 ENCOUNTER — Ambulatory Visit (HOSPITAL_COMMUNITY)
Admission: EM | Admit: 2020-01-09 | Discharge: 2020-01-09 | Disposition: A | Discharge status: Home / Self Care | Payer: Managed Care, Other (non HMO)

## 2020-01-09 ENCOUNTER — Encounter (HOSPITAL_COMMUNITY): Payer: Self-pay

## 2020-01-09 ENCOUNTER — Other Ambulatory Visit: Payer: Self-pay

## 2020-01-09 ENCOUNTER — Ambulatory Visit (INDEPENDENT_AMBULATORY_CARE_PROVIDER_SITE_OTHER): Payer: Managed Care, Other (non HMO)

## 2020-01-09 DIAGNOSIS — L089 Local infection of the skin and subcutaneous tissue, unspecified: Secondary | ICD-10-CM

## 2020-01-09 DIAGNOSIS — S62632A Displaced fracture of distal phalanx of right middle finger, initial encounter for closed fracture: Secondary | ICD-10-CM

## 2020-01-09 DIAGNOSIS — L03113 Cellulitis of right upper limb: Secondary | ICD-10-CM | POA: Diagnosis not present

## 2020-01-09 DIAGNOSIS — S6991XA Unspecified injury of right wrist, hand and finger(s), initial encounter: Secondary | ICD-10-CM

## 2020-01-09 DIAGNOSIS — S62662A Nondisplaced fracture of distal phalanx of right middle finger, initial encounter for closed fracture: Secondary | ICD-10-CM

## 2020-01-09 DIAGNOSIS — M79641 Pain in right hand: Secondary | ICD-10-CM

## 2020-01-09 MED ORDER — AMOXICILLIN-POT CLAVULANATE 875-125 MG PO TABS: 1.0000 | ORAL_TABLET | Freq: Two times a day (BID) | ORAL | 0 refills | Status: DC

## 2020-01-09 MED ORDER — NAPROXEN 500 MG PO TABS: 500.0000 mg | ORAL_TABLET | Freq: Two times a day (BID) | ORAL | 0 refills | Status: DC
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
## 2020-01-30 ENCOUNTER — Other Ambulatory Visit: Payer: Self-pay

## 2020-01-30 ENCOUNTER — Ambulatory Visit (INDEPENDENT_AMBULATORY_CARE_PROVIDER_SITE_OTHER): Payer: Self-pay | Admitting: Registered Nurse

## 2020-01-30 ENCOUNTER — Encounter: Payer: Self-pay | Admitting: Registered Nurse

## 2020-01-30 VITALS — BP 134/75 | HR 62 | Temp 97.4°F | Resp 16 | Ht 71.0 in | Wt 148.2 lb

## 2020-01-30 DIAGNOSIS — S60942A Unspecified superficial injury of right middle finger, initial encounter: Secondary | ICD-10-CM

## 2020-01-30 DIAGNOSIS — L089 Local infection of the skin and subcutaneous tissue, unspecified: Secondary | ICD-10-CM

## 2020-01-30 MED ORDER — SULFAMETHOXAZOLE-TRIMETHOPRIM 800-160 MG PO TABS: 1.0000 | ORAL_TABLET | Freq: Two times a day (BID) | ORAL | 0 refills | Status: DC

## 2020-01-30 MED ORDER — MUPIROCIN 2 % EX OINT: 1.0000 "application " | TOPICAL_OINTMENT | Freq: Two times a day (BID) | CUTANEOUS | 0 refills | Status: DC
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
## 2020-01-30 NOTE — Patient Instructions (Signed)
° ° ° °  If you have lab work done today you will be contacted with your lab results within the next 2 weeks.  If you have not heard from us then please contact us. The fastest way to get your results is to register for My Chart. ° ° °IF you received an x-ray today, you will receive an invoice from [facility]. Please contact [facility] at [phone] with questions or concerns regarding your invoice.  ° °IF you received labwork today, you will receive an invoice from [facility]. Please contact [facility] at [phone] with questions or concerns regarding your invoice.  ° °Our billing staff will not be able to assist you with questions regarding bills from these companies. ° °You will be contacted with the lab results as soon as they are available. The fastest way to get your results is to activate your My Chart account. Instructions are located on the last page of this paperwork. If you have not heard from us regarding the results in 2 weeks, please contact this office. °  ° ° ° °

## 2020-02-02 ENCOUNTER — Encounter: Payer: Self-pay | Admitting: Registered Nurse

## 2020-02-02 LAB — WOUND CULTURE: Organism ID, Bacteria: NONE SEEN

## 2020-04-13 ENCOUNTER — Ambulatory Visit (HOSPITAL_COMMUNITY)
Admission: RE | Admit: 2020-04-13 | Discharge: 2020-04-13 | Disposition: A | Discharge status: Home / Self Care | Payer: 59 | Attending: Psychiatry | Admitting: Psychiatry

## 2020-04-13 DIAGNOSIS — F1024 Alcohol dependence with alcohol-induced mood disorder: Secondary | ICD-10-CM

## 2020-04-13 NOTE — BH Assessment (Signed)
Comprehensive Clinical Assessment (CCA) Screening, Triage and Referral Note  04/14/2020 Justin May [id number]  Visit Diagnosis: F10.24, Alcohol-induced depressive disorder, With moderate or severe use disorder   Justin May is a 24 year old patient who voluntarily presents to [facility] due to increasing guilt, worthlessness, and irritability/anger. Pt shares he has been drinking approximately 3-4 8-ounce beers on a daily basis, though he's not sure for how long. Pt shares he was admitted to [facility] two years ago under IVC by either his mother or by the police and that, prior to that, he was hospitalized inpatient at a facility in [place]; he states that, at the time of those hospitalizations, he was experiencing much more anger than he is experiencing now and that he's found a psychiatrist who had assisted in prescribing medication to help him manage his angry outbursts.  Pt denies SI, HI, AVH, NSSIB, access to guns/knives, or engagement with the legal system. He denies the use of any substances, with the exception of EtOH. Pt states he is prescribed Librium and Clonazepam, which he believes are helpful to him; he states he also knows not to take his medication on the same nights in which he's going to engage in the use of EtOH. Pt expressed an understanding that his medication will only work if he takes it on a regular basis.  Pt's protective factors include stable housing and a lack of SI, HI, and AVH.  Pt declined to provide verbal consent for clinician to contact a friend/family member for collateral information.  Pt is oriented x5. His recent and remote memory is intact. Pt was polite and cooperative throughout the assessment process. Pt's insight, judgement, and impulse control is fair at this time.   Patient Reported Information How did you hear about [place]? Self   Referral name: No data recorded  Referral phone number: No data recorded Whom do you see for routine medical problems?  Primary Care   Practice/Facility Name: No data recorded  Practice/Facility Phone Number: No data recorded  Name of Contact: No data recorded  Contact Number: No data recorded  Contact Fax Number: No data recorded  Prescriber Name: No data recorded  Prescriber Address (if known): No data recorded What Is the Reason for Your Visit/Call Today? No data recorded How Long Has This Been Causing You Problems? No data recorded Have You Recently Been in Any Inpatient Treatment (Hospital/Detox/Crisis Center/28-Day Program)? No data recorded  Name/Location of Program/Hospital:No data recorded  How Long Were You There? No data recorded  When Were You Discharged? No data recorded Have You Ever Received Services From [facility] Before? No data recorded  Who Do You See at [facility]? No data recorded Have You Recently Had Any Thoughts About Hurting Yourself? No data recorded  Are You Planning to Commit Suicide/Harm Yourself At This time?  No data recorded Have you Recently Had Thoughts About Hurting Someone [name]? No data recorded  Explanation: No data recorded Have You Used Any Alcohol or Drugs in the Past 24 Hours? No data recorded  How Long Ago Did You Use Drugs or Alcohol?  No data recorded  What Did You Use and How Much? No data recorded What Do You Feel Would Help You the Most Today? No data recorded Do You Currently Have a Therapist/Psychiatrist? No data recorded  Name of Therapist/Psychiatrist: No data recorded  Have You Been Recently Discharged From Any Office Practice or Programs? No data recorded  Explanation of Discharge From Practice/Program:  No data recorded  CCA Screening Triage Referral Assessment Type of Contact: No data recorded  Is this Initial or Reassessment? No data recorded  Date Telepsych consult ordered in [facility]:  No data recorded  Time Telepsych consult ordered in [facility]:  No data recorded Patient Reported Information Reviewed? No data recorded  Patient Left Without  Being Seen? No data recorded  Reason for Not Completing Assessment: No data recorded Collateral Involvement: No data recorded Does Patient Have a Court Appointed Legal Guardian? No data recorded  Name and Contact of Legal Guardian:  Self  If Minor and Not Living with Parent(s), Who has Custody? N/A  Is CPS involved or ever been involved? No data recorded Is APS involved or ever been involved? No data recorded Patient Determined To Be At Risk for Harm To Self or Others Based on Review of Patient Reported Information or Presenting Complaint? No data recorded  Method: No data recorded  Availability of Means: No data recorded  Intent: No data recorded  Notification Required: No data recorded  Additional Information for Danger to Others Potential:  No data recorded  Additional Comments for Danger to Others Potential:  No data recorded  Are There Guns or Other Weapons in Your Home?  No data recorded   Types of Guns/Weapons: No data recorded   Are These Weapons Safely Secured?                              No data recorded   Who Could Verify You Are Able To Have These Secured:    No data recorded Do You Have any Outstanding Charges, Pending Court Dates, Parole/Probation? No data recorded Contacted To Inform of Risk of Harm To Self or Others: No data recorded Location of Assessment: [facility]  Does Patient Present under Involuntary Commitment? No data recorded  IVC Papers Initial File Date: No data recorded  [place] of Residence: No data recorded Patient Currently Receiving the Following Services: No data recorded  Determination of Need: No data recorded  Options For Referral: No data recorded  Adaku Anike, NP, reviewed pt's chart and information and determined pt can be psych cleared. Pt was provided information for otpt services, including information for the [facility] and when open hours are available on Fridays from 1300 - 1700. Pt was  encouraged to return to [facility] in the future should he have an concerns or increased symptoms; pt expressed an understanding and asked no questions.  Ralph Dowdy, LMFT

## 2020-04-14 ENCOUNTER — Ambulatory Visit (HOSPITAL_COMMUNITY)
Admission: RE | Admit: 2020-04-14 | Discharge: 2020-04-14 | Disposition: A | Discharge status: Home / Self Care | Payer: 59 | Attending: Psychiatry | Admitting: Psychiatry
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
## 2020-04-14 NOTE — H&P (Signed)
Behavioral Health Medical Screening Exam  Justin May is a 24 y.o. male who presents to [facility] voluntarily as a walk-in unaccompanied. Pt reports he is feeling worthless and has increased anxiety due to loosing his job yesterday because of his drinking. Pt reports his stressors are bad relationships and loosing several jobs. Pt reports he drinks 3-4 8 oz beers daily but only had 2 margaritas tonight. Pt states he is on librium and clonazepam but does not take as prescribed due his drinking. Pt denies SI, HI, SH, AVH, paranoia and prior SA. Pt states he sees no therapist but but has a psychiatrist. Pt has had 2 inpatient psychiatric hospitalizations. Pt lives with his grandmother and brother. Pt states he is able to contract for safety.  During evaluation pt is sitting; he is alert/oriented x 4; ooperative; and mood is depressed and anxious congruent with affect. Pt is speaking in a clear tone at moderate volume, and normal pace; with fair eye contact. His thought process is coherent and relevant; There is no indication that he is currently responding to internal/external stimuli or experiencing delusional thought content. Pt's insight and  judgement is fair, impulse control is in intact.  Total Time spent with patient: 30 minutes  Psychiatric Specialty Exam  Psychiatric Specialty Exam: Physical Exam  Review of Systems  Blood pressure (!) 136/91, pulse (!) 111, temperature 98.2 F (36.8 C), temperature source Oral, resp. rate 20, SpO2 99 %.There is no height or weight on file to calculate BMI.  General Appearance: Casual  Eye Contact:  Fair  Speech:  Normal Rate  Volume:  Normal  Mood:  Anxious and Depressed  Affect:  Congruent and Depressed  Thought Process:  Coherent and Descriptions of Associations: Intact  Orientation:  Full (Time, Place, and Person)  Thought Content:  Logical  Suicidal Thoughts:  No  Homicidal Thoughts:  No  Memory:  Recent;   Fair  Judgement:  Fair  Insight:  Fair   Psychomotor Activity:  Normal  Concentration:  Concentration: Good  Recall:  Fair  Fund of Knowledge:  Good  Language:  Good  Akathisia:  No  Handed:  Right  AIMS (if indicated):     Assets:  Communication Skills Desire for Improvement Financial Resources/Insurance Housing Resilience Talents/Skills  ADL's:  Intact  Cognition:  WNL  Sleep:      Physical Exam: Physical Exam HENT:     Head: Normocephalic.  Eyes:     Pupils: Pupils are equal, round, and reactive to light.  Pulmonary:     Effort: Pulmonary effort is normal.  Musculoskeletal:        General: Normal range of motion.     Cervical back: Normal range of motion.  Skin:    General: Skin is warm and dry.  Neurological:     Mental Status: He is alert and oriented to person, place, and time.  Psychiatric:        Attention and Perception: Attention normal.        Mood and Affect: Mood is anxious and depressed.        Speech: Speech normal.        Behavior: Behavior normal.        Thought Content: Thought content normal.        Cognition and Memory: Cognition normal.        Judgment: Judgment normal.    Review of Systems  Psychiatric/Behavioral: Positive for depression and substance abuse. Negative for hallucinations, memory loss and suicidal ideas. The  patient is nervous/anxious. The patient does not have insomnia.   All other systems reviewed and are negative.   Musculoskeletal: Strength & Muscle Tone: within normal limits Gait & Station: normal Patient leans: N/A   Recommendations:  Based on my evaluation the patient does not appear to have an emergency medical condition.   Disposition: No evidence of imminent risk to self or others at present.   Patient does not meet criteria for psychiatric inpatient admission. Supportive therapy provided about ongoing stressors. Discussed crisis plan, support from social network, calling 911, coming to the Emergency Department, and calling Suicide  Hotline.  Wandra Arthurs, NP 04/14/2020, 1:09 AM

## 2020-04-14 NOTE — H&P (Signed)
Behavioral Health Medical Screening Exam   Justin May is a 24 y.o. male with a history of alcohol-induced depressive disorder and anxiety who presents to [facility] voluntarily as a walk-in unaccompained. Pt was seen yesterday for similar presentation. Pt reports he went home after discharge and drank some alcohol this morning. Pt states he came back due to his inability to control his drinking urge. Pt states he wants to go into a residential facility. He denies SI, HI, SH, paranoia and AVH. Pt was given some outpatient resources for detox.   Total Time spent with patient: 15 minutes  Psychiatric Specialty Exam   Psychiatric Specialty Exam: Physical Exam  Review of Systems  Blood pressure (!) 136/91, pulse (!) 111, temperature 98.2 F (36.8 C), temperature source Oral, resp. rate 20, SpO2 99 %.There is no height or weight on file to calculate BMI.  General Appearance: Casual  Eye Contact:  Fair  Speech:  Normal Rate  Volume:  Normal  Mood:  Anxious and Depressed  Affect:  Congruent and Depressed  Thought Process:  Coherent and Descriptions of Associations: Intact  Orientation:  Full (Time, Place, and Person)  Thought Content:  Logical  Suicidal Thoughts:  No  Homicidal Thoughts:  No  Memory:  Recent;   Fair  Judgement:  Poor  Insight:  Fair  Psychomotor Activity:  Normal  Concentration:  Concentration: Good  Recall:  Good  Fund of Knowledge:  Good  Language:  Good  Akathisia:  No  Handed:  Right  AIMS (if indicated):     Assets:  Communication Skills Desire for Improvement Financial Resources/Insurance Housing Physical Health  ADL's:  Intact  Cognition:  WNL  Sleep:      Physical Exam: Physical Exam HENT:     Head: Normocephalic.  Eyes:     Pupils: Pupils are equal, round, and reactive to light.  Pulmonary:     Effort: Pulmonary effort is normal.  Musculoskeletal:        General: Normal range of motion.     Cervical back: Normal range of motion.  Skin:     General: Skin is warm and dry.  Neurological:     Mental Status: He is alert and oriented to person, place, and time.  Psychiatric:        Attention and Perception: Attention normal.        Mood and Affect: Mood is anxious and depressed.        Speech: Speech normal.        Behavior: Behavior normal.        Thought Content: Thought content normal.        Cognition and Memory: Cognition normal.        Judgment: Judgment normal.    Review of Systems  Psychiatric/Behavioral: Positive for depression and substance abuse. Negative for hallucinations, memory loss and suicidal ideas. The patient is nervous/anxious. The patient does not have insomnia.   All other systems reviewed and are negative.   Musculoskeletal: Strength & Muscle Tone: within normal limits Gait & Station: normal Patient leans: N/A   Recommendations:  Based on my evaluation the patient does not appear to have an emergency medical condition.   Disposition: No evidence of imminent risk to self or others at present.   Patient does not meet criteria for psychiatric inpatient admission. Supportive therapy provided about ongoing stressors. Discussed crisis plan, support from social network, calling 911, coming to the Emergency Department, and calling Suicide Hotline.  Wandra Arthurs, NP 04/14/2020,  9:50 PM

## 2020-04-15 ENCOUNTER — Inpatient Hospital Stay (HOSPITAL_COMMUNITY): Admit: 2020-04-15 | Payer: 59

## 2020-05-13 ENCOUNTER — Encounter (HOSPITAL_COMMUNITY): Payer: Self-pay | Admitting: Psychiatry

## 2020-05-13 ENCOUNTER — Inpatient Hospital Stay (HOSPITAL_COMMUNITY)
Admission: AD | Admit: 2020-05-13 | Discharge: 2020-05-17 | DRG: 885 | Disposition: A | Discharge status: Home / Self Care | Payer: 59 | Attending: Psychiatry | Admitting: Psychiatry

## 2020-05-13 DIAGNOSIS — F17210 Nicotine dependence, cigarettes, uncomplicated: Secondary | ICD-10-CM | POA: Diagnosis present

## 2020-05-13 DIAGNOSIS — G40909 Epilepsy, unspecified, not intractable, without status epilepticus: Secondary | ICD-10-CM | POA: Diagnosis present

## 2020-05-13 DIAGNOSIS — F1020 Alcohol dependence, uncomplicated: Secondary | ICD-10-CM | POA: Diagnosis present

## 2020-05-13 DIAGNOSIS — F1024 Alcohol dependence with alcohol-induced mood disorder: Secondary | ICD-10-CM | POA: Diagnosis present

## 2020-05-13 DIAGNOSIS — F17200 Nicotine dependence, unspecified, uncomplicated: Secondary | ICD-10-CM | POA: Diagnosis present

## 2020-05-13 DIAGNOSIS — F1010 Alcohol abuse, uncomplicated: Secondary | ICD-10-CM | POA: Diagnosis present

## 2020-05-13 DIAGNOSIS — F332 Major depressive disorder, recurrent severe without psychotic features: Principal | ICD-10-CM

## 2020-05-13 DIAGNOSIS — R45851 Suicidal ideations: Secondary | ICD-10-CM | POA: Diagnosis present

## 2020-05-13 DIAGNOSIS — F1910 Other psychoactive substance abuse, uncomplicated: Secondary | ICD-10-CM | POA: Diagnosis present

## 2020-05-13 DIAGNOSIS — F4325 Adjustment disorder with mixed disturbance of emotions and conduct: Secondary | ICD-10-CM | POA: Diagnosis present

## 2020-05-13 DIAGNOSIS — F411 Generalized anxiety disorder: Secondary | ICD-10-CM | POA: Diagnosis present

## 2020-05-13 DIAGNOSIS — F1094 Alcohol use, unspecified with alcohol-induced mood disorder: Secondary | ICD-10-CM | POA: Diagnosis present

## 2020-05-13 DIAGNOSIS — F4010 Social phobia, unspecified: Secondary | ICD-10-CM | POA: Diagnosis present

## 2020-05-13 DIAGNOSIS — G4700 Insomnia, unspecified: Secondary | ICD-10-CM | POA: Diagnosis present

## 2020-05-13 DIAGNOSIS — F1310 Sedative, hypnotic or anxiolytic abuse, uncomplicated: Secondary | ICD-10-CM | POA: Diagnosis present

## 2020-05-13 DIAGNOSIS — F10239 Alcohol dependence with withdrawal, unspecified: Secondary | ICD-10-CM | POA: Diagnosis present

## 2020-05-13 DIAGNOSIS — F419 Anxiety disorder, unspecified: Secondary | ICD-10-CM | POA: Insufficient documentation

## 2020-05-13 DIAGNOSIS — Z20822 Contact with and (suspected) exposure to covid-19: Secondary | ICD-10-CM | POA: Diagnosis present

## 2020-05-13 HISTORY — DX: Major depressive disorder, recurrent severe without psychotic features: F33.2

## 2020-05-13 MED ORDER — ACETAMINOPHEN 325 MG PO TABS: 650.0000 mg | ORAL_TABLET | Freq: Four times a day (QID) | ORAL | Status: DC | PRN

## 2020-05-13 MED ORDER — MELATONIN 5 MG PO TABS
5.0000 mg | ORAL_TABLET | Freq: Every day | ORAL | Status: DC
  Administered 2020-05-14 – 2020-05-15 (×2): 5 mg via ORAL
  Filled 2020-05-13 (×9): qty 1

## 2020-05-13 MED ORDER — TRAZODONE HCL 50 MG PO TABS
50.0000 mg | ORAL_TABLET | Freq: Every evening | ORAL | Status: DC | PRN
  Administered 2020-05-14 – 2020-05-16 (×3): 50 mg via ORAL
  Filled 2020-05-13 (×4): qty 1

## 2020-05-13 MED ORDER — MAGNESIUM HYDROXIDE 400 MG/5ML PO SUSP: 30.0000 mL | Freq: Every day | ORAL | Status: DC | PRN

## 2020-05-13 MED ORDER — ALUM & MAG HYDROXIDE-SIMETH 200-200-20 MG/5ML PO SUSP: 30.0000 mL | ORAL | Status: DC | PRN

## 2020-05-13 MED ORDER — CLONAZEPAM 1 MG PO TABS
2.0000 mg | ORAL_TABLET | Freq: Once | ORAL | Status: AC
  Administered 2020-05-14: 2 mg via ORAL
  Filled 2020-05-13: qty 2

## 2020-05-14 ENCOUNTER — Other Ambulatory Visit: Payer: Self-pay

## 2020-05-14 ENCOUNTER — Encounter (HOSPITAL_COMMUNITY): Payer: Self-pay

## 2020-05-14 DIAGNOSIS — F1094 Alcohol use, unspecified with alcohol-induced mood disorder: Secondary | ICD-10-CM

## 2020-05-14 DIAGNOSIS — F332 Major depressive disorder, recurrent severe without psychotic features: Principal | ICD-10-CM

## 2020-05-14 DIAGNOSIS — F1020 Alcohol dependence, uncomplicated: Secondary | ICD-10-CM

## 2020-05-14 LAB — CBC
HCT: 46.5 % (ref 39.0–52.0)
Hemoglobin: 15.7 g/dL (ref 13.0–17.0)
MCH: 31.5 pg (ref 26.0–34.0)
MCHC: 33.8 g/dL (ref 30.0–36.0)
MCV: 93.4 fL (ref 80.0–100.0)
Platelets: 213 10*3/uL (ref 150–400)
RBC: 4.98 MIL/uL (ref 4.22–5.81)
RDW: 12.3 % (ref 11.5–15.5)
WBC: 5.5 10*3/uL (ref 4.0–10.5)
nRBC: 0 % (ref 0.0–0.2)

## 2020-05-14 LAB — COMPREHENSIVE METABOLIC PANEL
ALT: 16 U/L (ref 0–44)
AST: 23 U/L (ref 15–41)
Albumin: 3.8 g/dL (ref 3.5–5.0)
Alkaline Phosphatase: 59 U/L (ref 38–126)
Anion gap: 12 (ref 5–15)
BUN: 11 mg/dL (ref 6–20)
CO2: 25 mmol/L (ref 22–32)
Calcium: 8.8 mg/dL — ABNORMAL LOW (ref 8.9–10.3)
Chloride: 107 mmol/L (ref 98–111)
Creatinine, Ser: 1.02 mg/dL (ref 0.61–1.24)
GFR calc Af Amer: >60 mL/min (ref >60)
GFR calc non Af Amer: >60 mL/min (ref >60)
Glucose, Bld: 88 mg/dL (ref 70–99)
Potassium: 4 mmol/L (ref 3.5–5.1)
Sodium: 144 mmol/L (ref 135–145)
Total Bilirubin: 0.2 mg/dL — ABNORMAL LOW (ref 0.3–1.2)
Total Protein: 6.1 g/dL — ABNORMAL LOW (ref 6.5–8.1)

## 2020-05-14 LAB — HEPATIC FUNCTION PANEL
ALT: 16 U/L (ref 0–44)
AST: 23 U/L (ref 15–41)
Albumin: 3.8 g/dL (ref 3.5–5.0)
Alkaline Phosphatase: 63 U/L (ref 38–126)
Bilirubin, Direct: <0.1 mg/dL (ref 0.0–0.2)
Total Bilirubin: 0.3 mg/dL (ref 0.3–1.2)
Total Protein: 6.2 g/dL — ABNORMAL LOW (ref 6.5–8.1)

## 2020-05-14 LAB — RAPID URINE DRUG SCREEN, HOSP PERFORMED
Amphetamines: NOT DETECTED
Barbiturates: NOT DETECTED
Benzodiazepines: POSITIVE — AB
Cocaine: NOT DETECTED
Opiates: NOT DETECTED
Tetrahydrocannabinol: NOT DETECTED

## 2020-05-14 LAB — RESP PANEL BY RT PCR (RSV, FLU A&B, COVID)
Influenza A by PCR: NEGATIVE
Influenza B by PCR: NEGATIVE
Respiratory Syncytial Virus by PCR: NEGATIVE
SARS Coronavirus 2 by RT PCR: NEGATIVE

## 2020-05-14 LAB — ETHANOL: Alcohol, Ethyl (B): 43 mg/dL — ABNORMAL HIGH (ref <10)

## 2020-05-14 LAB — LIPID PANEL
Cholesterol: 166 mg/dL (ref 0–200)
HDL: 68 mg/dL (ref >40)
LDL Cholesterol: 61 mg/dL (ref 0–99)
Total CHOL/HDL Ratio: 2.4 RATIO
Triglycerides: 187 mg/dL — ABNORMAL HIGH (ref <150)
VLDL: 37 mg/dL (ref 0–40)

## 2020-05-14 LAB — TSH: TSH: 2.072 u[IU]/mL (ref 0.350–4.500)

## 2020-05-14 MED ORDER — NICOTINE POLACRILEX 2 MG MT GUM
2.0000 mg | CHEWING_GUM | OROMUCOSAL | Status: DC | PRN
  Administered 2020-05-14 – 2020-05-17 (×4): 2 mg via ORAL
  Filled 2020-05-14 (×4): qty 1

## 2020-05-14 MED ORDER — LEVETIRACETAM 500 MG PO TABS
500.0000 mg | ORAL_TABLET | Freq: Two times a day (BID) | ORAL | Status: DC
  Administered 2020-05-14 – 2020-05-17 (×7): 500 mg via ORAL
  Filled 2020-05-14 (×12): qty 1

## 2020-05-14 MED ORDER — FOLIC ACID 1 MG PO TABS
1.0000 mg | ORAL_TABLET | Freq: Every day | ORAL | Status: DC
  Administered 2020-05-14 – 2020-05-17 (×4): 1 mg via ORAL
  Filled 2020-05-14 (×6): qty 1

## 2020-05-14 MED ORDER — NICOTINE 21 MG/24HR TD PT24
21.0000 mg | MEDICATED_PATCH | Freq: Every day | TRANSDERMAL | Status: DC
  Filled 2020-05-14: qty 1

## 2020-05-14 MED ORDER — LORAZEPAM 1 MG PO TABS
1.0000 mg | ORAL_TABLET | Freq: Four times a day (QID) | ORAL | Status: DC | PRN
  Administered 2020-05-14 – 2020-05-15 (×2): 1 mg via ORAL
  Filled 2020-05-14 (×2): qty 1

## 2020-05-14 MED ORDER — THIAMINE HCL 100 MG PO TABS
100.0000 mg | ORAL_TABLET | Freq: Every day | ORAL | Status: DC
  Administered 2020-05-14 – 2020-05-17 (×4): 100 mg via ORAL
  Filled 2020-05-14 (×6): qty 1
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
## 2020-05-14 NOTE — Tx Team (Signed)
Initial Treatment Plan 05/14/2020 3:49 AM Gaylyn Lambert CEY:[id number]    PATIENT STRESSORS: Financial difficulties Loss of breakup with girlfriend Substance abuse   PATIENT STRENGTHS: Average or above average intelligence Motivation for treatment/growth Physical Health   PATIENT IDENTIFIED PROBLEMS: EtoH abuse  Anger issues  Job loss  Breakup with girlfriend  (wants to work on Restaurant manager, fast food; better communication for conflict resolution and to stop alcohol abuse)             DISCHARGE CRITERIA:  Adequate post-discharge living arrangements Improved stabilization in mood, thinking, and/or behavior Verbal commitment to aftercare and medication compliance  PRELIMINARY DISCHARGE PLAN: Attend aftercare/continuing care group Outpatient therapy Return to previous living arrangement  PATIENT/FAMILY INVOLVEMENT: This treatment plan has been presented to and reviewed with the patient, SHANNEN VERNON, and/or family member.  The patient and family have been given the opportunity to ask questions and make suggestions.  Victorino December, RN 05/14/2020, 3:49 AM
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
## 2020-05-14 NOTE — Tx Team (Signed)
Interdisciplinary Treatment and Diagnostic Plan Update  05/14/2020 Time of Session: 9:35am Justin May MRN: [id number]  Principal Diagnosis: <principal problem not specified>  Secondary Diagnoses: Active Problems:   Anxiety state   Social phobia   Tobacco use disorder   Mild benzodiazepine use disorder (HCC)   Alcohol abuse   Insomnia   Adjustment disorder with mixed disturbance of emotions and conduct   Alcohol use with alcohol-induced mood disorder (HCC)   Alcohol use disorder, moderate, dependence (HCC)   Substance abuse, binge pattern (HCC)   Seizure disorder (HCC)   MDD (major depressive disorder), recurrent episode, severe ([place])   Current Medications:  Current Facility-Administered Medications  Medication Dose Route Frequency Provider Last Rate Last Admin  . acetaminophen (TYLENOL) tablet 650 mg  650 mg Oral Q6H PRN Caroline Sauger, NP      . alum & mag hydroxide-simeth (MAALOX/MYLANTA) 200-200-20 MG/5ML suspension 30 mL  30 mL Oral Q4H PRN Caroline Sauger, NP      . folic acid (FOLVITE) tablet 1 mg  1 mg Oral Daily Sharma Covert, MD   1 mg at 05/14/20 0933  . levETIRAcetam (KEPPRA) tablet 500 mg  500 mg Oral BID Sharma Covert, MD   500 mg at 05/14/20 3212  . LORazepam (ATIVAN) tablet 1 mg  1 mg Oral Q6H PRN Sharma Covert, MD      . magnesium hydroxide (MILK OF MAGNESIA) suspension 30 mL  30 mL Oral Daily PRN Caroline Sauger, NP      . melatonin tablet 5 mg  5 mg Oral QHS Caroline Sauger, NP   5 mg at 05/14/20 0233  . nicotine polacrilex (NICORETTE) gum 2 mg  2 mg Oral PRN Caroline Sauger, NP      . thiamine tablet 100 mg  100 mg Oral Daily Sharma Covert, MD   100 mg at 05/14/20 0933  . traZODone (DESYREL) tablet 50 mg  50 mg Oral QHS PRN Caroline Sauger, NP       PTA Medications: Medications Prior to Admission  Medication Sig Dispense Refill Last Dose  . chlordiazePOXIDE (LIBRIUM) 25 MG capsule Take 25 mg by mouth 2  (two) times daily.      . clonazePAM (KLONOPIN) 1 MG tablet Take 1 mg by mouth 2 (two) times daily.      Marland Kitchen gabapentin (NEURONTIN) 600 MG tablet Take 600 mg by mouth 4 (four) times daily as needed (seizure aura).      . levETIRAcetam (KEPPRA) 500 MG tablet Take 1 tablet (500 mg total) by mouth 2 (two) times daily. 60 tablet 1 05/14/2020 at Unknown time  . amoxicillin-clavulanate (AUGMENTIN) 875-125 MG tablet Take 1 tablet by mouth every 12 (twelve) hours. (Patient not taking: Reported on 01/30/2020) 20 tablet 0 Completed Course at Unknown time  . meloxicam (MOBIC) 15 MG tablet Take 1 tablet (15 mg total) by mouth daily. (Patient not taking: Reported on 05/14/2020) 30 tablet 1 Completed Course at Unknown time  . mupirocin ointment (BACTROBAN) 2 % Place 1 application into the nose 2 (two) times daily. (Patient not taking: Reported on 05/14/2020) 22 g 0 Completed Course at Unknown time  . naproxen (NAPROSYN) 500 MG tablet Take 1 tablet (500 mg total) by mouth 2 (two) times daily. (Patient not taking: Reported on 05/14/2020) 30 tablet 0 Completed Course at Unknown time  . sulfamethoxazole-trimethoprim (BACTRIM DS) 800-160 MG tablet Take 1 tablet by mouth 2 (two) times daily. (Patient not taking: Reported on 05/14/2020) 14 tablet 0 Completed  Course at Unknown time    Patient Stressors: Financial difficulties Loss of breakup with girlfriend Substance abuse  Patient Strengths: Average or above average intelligence Motivation for treatment/growth Physical Health  Treatment Modalities: Medication Management, Group therapy, Case management,  1 to 1 session with clinician, Psychoeducation, Recreational therapy.   Physician Treatment Plan for Primary Diagnosis: <principal problem not specified> Long Term Goal(s): Improvement in symptoms so as ready for discharge Improvement in symptoms so as ready for discharge   Short Term Goals: Ability to identify changes in lifestyle to reduce recurrence of condition  will improve Ability to verbalize feelings will improve Ability to disclose and discuss suicidal ideas Ability to identify and develop effective coping behaviors will improve Ability to maintain clinical measurements within normal limits will improve Compliance with prescribed medications will improve Ability to identify changes in lifestyle to reduce recurrence of condition will improve Ability to verbalize feelings will improve Ability to disclose and discuss suicidal ideas Ability to identify and develop effective coping behaviors will improve Ability to maintain clinical measurements within normal limits will improve Compliance with prescribed medications will improve  Medication Management: Evaluate patient's response, side effects, and tolerance of medication regimen.  Therapeutic Interventions: 1 to 1 sessions, Unit Group sessions and Medication administration.  Evaluation of Outcomes: Not Met  Physician Treatment Plan for Secondary Diagnosis: Active Problems:   Anxiety state   Social phobia   Tobacco use disorder   Mild benzodiazepine use disorder (HCC)   Alcohol abuse   Insomnia   Adjustment disorder with mixed disturbance of emotions and conduct   Alcohol use with alcohol-induced mood disorder (HCC)   Alcohol use disorder, moderate, dependence (HCC)   Substance abuse, binge pattern (HCC)   Seizure disorder (HCC)   MDD (major depressive disorder), recurrent episode, severe ([place])  Long Term Goal(s): Improvement in symptoms so as ready for discharge Improvement in symptoms so as ready for discharge   Short Term Goals: Ability to identify changes in lifestyle to reduce recurrence of condition will improve Ability to verbalize feelings will improve Ability to disclose and discuss suicidal ideas Ability to identify and develop effective coping behaviors will improve Ability to maintain clinical measurements within normal limits will improve Compliance with prescribed  medications will improve Ability to identify changes in lifestyle to reduce recurrence of condition will improve Ability to verbalize feelings will improve Ability to disclose and discuss suicidal ideas Ability to identify and develop effective coping behaviors will improve Ability to maintain clinical measurements within normal limits will improve Compliance with prescribed medications will improve     Medication Management: Evaluate patient's response, side effects, and tolerance of medication regimen.  Therapeutic Interventions: 1 to 1 sessions, Unit Group sessions and Medication administration.  Evaluation of Outcomes: Not Met   RN Treatment Plan for Primary Diagnosis: <principal problem not specified> Long Term Goal(s): Knowledge of disease and therapeutic regimen to maintain health will improve  Short Term Goals: Ability to remain free from injury will improve, Ability to verbalize frustration and anger appropriately will improve, Ability to identify and develop effective coping behaviors will improve and Compliance with prescribed medications will improve  Medication Management: RN will administer medications as ordered by provider, will assess and evaluate patient's response and provide education to patient for prescribed medication. RN will report any adverse and/or side effects to prescribing provider.  Therapeutic Interventions: 1 on 1 counseling sessions, Psychoeducation, Medication administration, Evaluate responses to treatment, Monitor vital signs and CBGs as ordered, Perform/monitor CIWA, COWS, AIMS  and Fall Risk screenings as ordered, Perform wound care treatments as ordered.  Evaluation of Outcomes: Not Met   LCSW Treatment Plan for Primary Diagnosis: <principal problem not specified> Long Term Goal(s): Safe transition to appropriate next level of care at discharge, Engage patient in therapeutic group addressing interpersonal concerns.  Short Term Goals: Engage patient  in aftercare planning with referrals and resources, Increase social support, Identify triggers associated with mental health/substance abuse issues and Increase skills for wellness and recovery  Therapeutic Interventions: Assess for all discharge needs, 1 to 1 time with Social worker, Explore available resources and support systems, Assess for adequacy in community support network, Educate family and significant other(s) on suicide prevention, Complete Psychosocial Assessment, Interpersonal group therapy.  Evaluation of Outcomes: Not Met   Progress in Treatment: Attending groups: No. Participating in groups: No. Taking medication as prescribed: Yes. Toleration medication: Yes. Family/Significant other contact made: No, will contact:  if consent is provided. Patient understands diagnosis: Yes. Discussing patient identified problems/goals with staff: No. Medical problems stabilized or resolved: Yes. Denies suicidal/homicidal ideation: Yes. Issues/concerns per patient self-inventory: No.   New problem(s) identified: No, Describe:  none.  New Short Term/Long Term Goal(s):  Patient Goals:  Patient did not attend.  Discharge Plan or Barriers:   Reason for Continuation of Hospitalization: Depression Medication stabilization Suicidal ideation  Estimated Length of Stay: 3-5 days  Attendees: Patient: Did not attend. 05/14/2020   Physician: Myles Lipps, MD 05/14/2020   Nursing:  05/14/2020   RN Care Manager: 05/14/2020   Social Worker: Darletta Moll, LCSW 05/14/2020   Recreational Therapist:  05/14/2020   Other:  05/14/2020   Other:  05/14/2020   Other: 05/14/2020       Scribe for Treatment Team: Vassie Moselle, LCSW 05/14/2020 11:08 AM

## 2020-05-14 NOTE — Progress Notes (Signed)
   05/14/20 0230  Psych Admission Type (Psych Patients Only)  Admission Status Voluntary  Psychosocial Assessment  Patient Complaints Substance abuse  Eye Contact Fair  Facial Expression Sad  Affect Appropriate to circumstance  Speech Logical/coherent  Interaction Assertive  Motor Activity Slow  Appearance/Hygiene Body odor;Poor hygiene (smells like EtoH)  Behavior Characteristics Cooperative  Mood Depressed;Pleasant  Thought Process  Coherency WDL  Content WDL  Delusions None reported or observed  Perception WDL  Hallucination None reported or observed  Judgment Impaired  Confusion None  Danger to Self  Current suicidal ideation? Denies  Danger to Others  Danger to Others None reported or observed   Pt endorses SI over last week but no real intention to act or plan. Pt endorses a "half-hearted suicide attempt a few years ago. Pt states that his drinking turns outward resulting in fights and anger management issues. Pt is also a boxer. Pt wants to learn to cope with stressors, find the job he always wanted to have and also find a faithful relationship.  Pt lives with grandmother and brother but is unsure if they will welcome him back after his discharge from [facility].  Pt endorses financial issues due to lost employment from drinking, but denies any legal issues.

## 2020-05-15 NOTE — Progress Notes (Signed)
   05/15/20 2010  COVID-19 Daily Checkoff  Have you had a fever (temp > 37.80C/100F)  in the past 24 hours?  No  COVID-19 EXPOSURE  Have you traveled outside the state in the past 14 days? No  Have you been in contact with someone with a confirmed diagnosis of COVID-19 or PUI in the past 14 days without wearing appropriate PPE? No  Have you been living in the same home as a person with confirmed diagnosis of COVID-19 or a PUI (household contact)? No  Have you been diagnosed with COVID-19? No

## 2020-05-15 NOTE — [facility] Group Notes (Signed)
Adult Psychoeducational Group Note  Date:  05/15/2020 Time:  11:47 AM  Group Topic/Focus:  Goals Group:   The focus of this group is to help patients establish daily goals to achieve during treatment and discuss how the patient can incorporate goal setting into their daily lives to aide in recovery.  Participation Level:  Active  Participation Quality:  Appropriate  Affect:  Appropriate  Cognitive:  Oriented  Insight: Improving and Lacking  Engagement in Group:  Engaged  Modes of Intervention:  Discussion, Education, Exploration and Support  Additional Comments:  Pt rates his energy at a 4/10. States that he is here for alcohol abuse. Wants to go to the outpatient program from here. Pt's goal is to be abl to do some physical working out today. Pushups and other exercises.Sharee Pimple, Nuri Larmer A 05/15/2020, 11:47 AM

## 2020-05-15 NOTE — Progress Notes (Signed)
D: Patient present calm and cooperative. His stated goal for today is to keep his sobriety and maintain a healthy mindset. Denies any physical complaints. Patient rated depression 2/10, hopelessness 1/10, and anxiety 4/10. Denies SI, HI and AVH.   A: Maintain 15 minute unit safety checks. Administer schedule medications. Support and encouragement offered. Encouraged to participate in groups and he verbalized he will tomorrow.   R: Remains safe.   [place] NOVEL CORONAVIRUS (COVID-19) DAILY CHECK-OFF SYMPTOMS - answer yes or no to each - every day NO YES  Have you had a fever in the past 24 hours?   Fever (Temp > 37.80C / 100F) X    Have you had any of these symptoms in the past 24 hours?  New Cough   Sore Throat    Shortness of Breath   Difficulty Breathing   Unexplained Body Aches   X    Have you had any one of these symptoms in the past 24 hours not related to allergies?    Runny Nose   Nasal Congestion   Sneezing   X    If you have had runny nose, nasal congestion, sneezing in the past 24 hours, has it worsened?   X    EXPOSURES - check yes or no X    Have you traveled outside the state in the past 14 days?   X    Have you been in contact with someone with a confirmed diagnosis of COVID-19 or PUI in the past 14 days without wearing appropriate PPE?   X    Have you been living in the same home as a person with confirmed diagnosis of COVID-19 or a PUI (household contact)?     X    Have you been diagnosed with COVID-19?     X                                                                                                                             What to do next: Answered NO to all: Answered YES to anything:    Proceed with unit schedule Follow the [facility] Inpatient Flowsheet.

## 2020-05-15 NOTE — [facility] Group Notes (Signed)
Adult Psychoeducational Group Note  Date:  05/15/2020 Time:  2:50 PM  Group Topic/Focus:  Identifying Needs:   The focus of this group is to help patients identify their personal needs that have been historically problematic and identify healthy behaviors to address their needs.  Participation Level:  Did Not Attend  [name] 05/15/2020, 2:50 PM

## 2020-05-15 NOTE — [facility] Counselor (Signed)
Clinical Social Work Note  CSW spoke with patient 1:1 to give him the information that he will not be allowed to return to live with grandmother unless he goes to rehab.  He was very calm in receiving this information (per nurse, had just received a dose of Ativan 1/2 hour earlier), said he "figured" and would plan to go stay with a friend.  CSW promoted the idea of rehab, and he said he is not ready for that, although he was willing to listen to a description of it.  CSW brought up the mood lability that grandmother/brother described, mentioned that he is not on any medicine for it but that he could talk to the doctor about such, given his inability to keep a job because of getting into arguments with people.  He said he has tried a lot of anti-depressants and they made it worse.  He asked if he could call his grandmother and CSW affirmed this, said that grandmother was very glad to know where he was and that he was safe, and that she had sent a message of love to him.  He was on the phone talking calming when CSW walked by.  Nurse and doctor were informed.  Ambrose Mantle, LCSW 05/15/2020, 5:55 PM
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
## 2020-05-15 NOTE — Progress Notes (Signed)
[facility]  05/15/2020 3:53 PM Justin May  MRN:  [id number]  Subjective: Justin May reports, "I just lost my job, start feeling depressed about it, start drinking heavily. I know, I needed help. That May the reason I claimed that I was feeling suicidal so that I can come in to this hospital for help. I like the group sessions. It did help me in the past".  Objective: Patient May seen and examined. Patient May a 24 year old male with a past psychiatric history significant for alcohol dependence who presented to the behavioral health hospital as a walk-in evaluation on 05/13/2020 with reported suicidality, guilt, anger, worthlessness and anxiety. The patient stated that he had begun drinking alcohol in his teens. He stated he had been in the hospital for detox at and not been in any residential treatment programs. He stated that most recently he felt as though his family had become nonsupportive, and they would support him at times, and then become angry with him. He became frustrated over that. He has been drinking essentially daily for the last several months. Justin May seen, chart reviewed. The chart findings discussed with the treatment team. He presents alert, oriented & aware of situation. He May visible on the unit, attending group sessions. He reports today that he came to the hospital because he was drinking a lot more alcohol because he was depressed about his recent job loss. He says after losing his job, he realized at [id number], he has not much to show for it. He says he does not have a stable place he calls home. He feels his family are not very supportive. He says he knew if he continues the way he was drinking recently, it will not end well for him. He says that was the reason he claimed he was feeling suicidal at the Ed, to [facility] because he knows he will be able to have someone to talk to here & also meet other patient's like him. He denies any alcohol withdrawal  symptoms. He denies any SIHI, AVH, delusional thoughts or paranoia. He does not appear to be responding to any internal stimuli. Justin May in agreement to continue current plan of care as already in progress.  Principal Problem: MDD (major depressive disorder), recurrent episode, severe (HCC)  Diagnosis: Principal Problem:   MDD (major depressive disorder), recurrent episode, severe (HCC) Active Problems:   Alcohol use disorder, moderate, dependence (HCC)   Anxiety state   Social phobia   Tobacco use disorder   Mild benzodiazepine use disorder (HCC)   Alcohol abuse   Insomnia   Adjustment disorder with mixed disturbance of emotions and conduct   Alcohol use with alcohol-induced mood disorder (HCC)   Substance abuse, binge pattern (HCC)   Seizure disorder (HCC)  Total Time spent with patient: 25 minutes  Past Psychiatric History: See H&P  Past Medical History:  Past Medical History:  Diagnosis Date  . ADHD   . Anxiety   . Asthma   . MDD (major depressive disorder), recurrent episode, severe (HCC) 05/13/2020  . Seizure disorder (HCC) 07/24/2019  . Seizures (HCC)     Past Surgical History:  Procedure Laterality Date  . ADENOIDECTOMY     Family History:  Family History  Problem Relation Age of Onset  . Hypertension Father    Family Psychiatric  History: See H&P  Social History:  Social History   Substance and Sexual Activity  Alcohol Use Yes  .  Alcohol/week: 2.0 standard drinks  . Types: 2 Cans of beer per week   Comment: drinks 2 beers every other day     Social History   Substance and Sexual Activity  Drug Use Not Currently    Social History   Socioeconomic History  . Marital status: Single    Spouse name: Not on file  . Number of children: Not on file  . Years of education: Not on file  . Highest education level: Not on file  Occupational History  . Not on file  Tobacco Use  . Smoking status: Light Tobacco Smoker    Packs/day: 1.00    Years: 9.00     Pack years: 9.00    Types: Cigarettes    Last attempt to quit: 09/30/2018    Years since quitting: 1.6  . Smokeless tobacco: Former Clinical biochemist  . Vaping Use: Every day  . Substances: Nicotine  Substance and Sexual Activity  . Alcohol use: Yes    Alcohol/week: 2.0 standard drinks    Types: 2 Cans of beer per week    Comment: drinks 2 beers every other day  . Drug use: Not Currently  . Sexual activity: Not Currently  Other Topics Concern  . Not on file  Social History Narrative  . Not on file   Social Determinants of Health   Financial Resource Strain:   . Difficulty of Paying Living Expenses:   Food Insecurity:   . Worried About Programme researcher, broadcasting/film/video in the Last Year:   . Barista in the Last Year:   Transportation Needs:   . Freight forwarder (Medical):   Marland Kitchen Lack of Transportation (Non-Medical):   Physical Activity:   . Days of Exercise per Week:   . Minutes of Exercise per Session:   Stress:   . Feeling of Stress :   Social Connections:   . Frequency of Communication with Friends and Family:   . Frequency of Social Gatherings with Friends and Family:   . Attends Religious Services:   . Active Member of Clubs or Organizations:   . Attends Banker Meetings:   Marland Kitchen Marital Status:    Additional Social History:    Pain Medications: Please see MAR Prescriptions: Please see MAR Over the Counter: Please see MAR History of alcohol / drug use?: Yes Longest period of sobriety (when/how long): Unknown Name of Substance 1: EtOH 1 - Age of First Use: Unknown 1 - Amount (size/oz): 2 12-ounce beers 1 - Frequency: Every-other day 1 - Duration: Unknown 1 - Last Use / Amount: Tonight around 2000  Sleep: Good  Appetite:  Good  Current Medications: Current Facility-Administered Medications  Medication Dose Route Frequency Provider Last Rate Last Admin  . acetaminophen (TYLENOL) tablet 650 mg  650 mg Oral Q6H PRN Gillermo Murdoch, NP      .  alum & mag hydroxide-simeth (MAALOX/MYLANTA) 200-200-20 MG/5ML suspension 30 mL  30 mL Oral Q4H PRN Gillermo Murdoch, NP      . folic acid (FOLVITE) tablet 1 mg  1 mg Oral Daily Antonieta Pert, MD   1 mg at 05/15/20 0740  . levETIRAcetam (KEPPRA) tablet 500 mg  500 mg Oral BID Antonieta Pert, MD   500 mg at 05/15/20 0739  . LORazepam (ATIVAN) tablet 1 mg  1 mg Oral Q6H PRN Antonieta Pert, MD   1 mg at 05/14/20 2130  . magnesium hydroxide (MILK OF MAGNESIA) suspension 30 mL  30 mL Oral Daily PRN Gillermo Murdoch, NP      . melatonin tablet 5 mg  5 mg Oral QHS Gillermo Murdoch, NP   5 mg at 05/14/20 0233  . nicotine polacrilex (NICORETTE) gum 2 mg  2 mg Oral PRN Gillermo Murdoch, NP   2 mg at 05/15/20 1548  . thiamine tablet 100 mg  100 mg Oral Daily Antonieta Pert, MD   100 mg at 05/15/20 0739  . traZODone (DESYREL) tablet 50 mg  50 mg Oral QHS PRN Gillermo Murdoch, NP   50 mg at 05/14/20 2130    Lab Results:  Results for orders placed or performed during the hospital encounter of 05/13/20 (from the past 48 hour(s))  Resp Panel by RT PCR (RSV, Flu A&B, Covid) - Nasopharyngeal Swab     Status: None   Collection Time: 05/13/20 11:54 PM   Specimen: Nasopharyngeal Swab  Result Value Ref Range   SARS Coronavirus 2 by RT PCR NEGATIVE NEGATIVE    Comment: (NOTE) SARS-CoV-2 target nucleic acids are NOT DETECTED.  The SARS-CoV-2 RNA May generally detectable in upper respiratoy specimens during the acute phase of infection. The lowest concentration of SARS-CoV-2 viral copies this assay can detect May 131 copies/mL. A negative result does not preclude SARS-Cov-2 infection and should not be used as the sole basis for treatment or other patient management decisions. A negative result may occur with  improper specimen collection/handling, submission of specimen other than nasopharyngeal swab, presence of viral mutation(s) within the areas targeted by this assay, and  inadequate number of viral copies (<131 copies/mL). A negative result must be combined with clinical observations, patient history, and epidemiological information. The expected result May Negative.  Fact Sheet for Patients:  https://www.moore.com/  Fact Sheet for Healthcare Providers:  https://www.young.biz/  This test May no t yet approved or cleared by the [place] FDA and  has been authorized for detection and/or diagnosis of SARS-CoV-2 by FDA under an Emergency Use Authorization (EUA). This EUA will remain  in effect (meaning this test can be used) for the duration of the COVID-19 declaration under Section 564(b)(1) of the Act, 21 U.S.C. section 360bbb-3(b)(1), unless the authorization May terminated or revoked sooner.     Influenza A by PCR NEGATIVE NEGATIVE   Influenza B by PCR NEGATIVE NEGATIVE    Comment: (NOTE) The Xpert Xpress SARS-CoV-2/FLU/RSV assay May intended as an aid in  the diagnosis of influenza from Nasopharyngeal swab specimens and  should not be used as a sole basis for treatment. Nasal washings and  aspirates are unacceptable for Xpert Xpress SARS-CoV-2/FLU/RSV  testing.  Fact Sheet for Patients: https://www.moore.com/  Fact Sheet for Healthcare Providers: https://www.young.biz/  This test May not yet approved or cleared by the [place] FDA and  has been authorized for detection and/or diagnosis of SARS-CoV-2 by  FDA under an Emergency Use Authorization (EUA). This EUA will remain  in effect (meaning this test can be used) for the duration of the  Covid-19 declaration under Section 564(b)(1) of the Act, 21  U.S.C. section 360bbb-3(b)(1), unless the authorization May  terminated or revoked.    Respiratory Syncytial Virus by PCR NEGATIVE NEGATIVE    Comment: (NOTE) Fact Sheet for Patients: https://www.moore.com/  Fact Sheet for Healthcare  Providers: https://www.young.biz/  This test May not yet approved or cleared by the [place] FDA and  has been authorized for detection and/or diagnosis of SARS-CoV-2 by  FDA under an Emergency Use Authorization (EUA). This EUA will  remain  in effect (meaning this test can be used) for the duration of the  COVID-19 declaration under Section 564(b)(1) of the Act, 21 U.S.C.  section 360bbb-3(b)(1), unless the authorization May terminated or  revoked. Performed at [facility], [address] [place], [place] [address]   CBC     Status: None   Collection Time: 05/14/20  6:29 AM  Result Value Ref Range   WBC 5.5 4.0 - 10.5 K/uL   RBC 4.98 4.22 - 5.81 MIL/uL   Hemoglobin 15.7 13.0 - 17.0 g/dL   HCT 19.1 39 - 52 %   MCV 93.4 80.0 - 100.0 fL   MCH 31.5 26.0 - 34.0 pg   MCHC 33.8 30.0 - 36.0 g/dL   RDW 47.8 29.5 - 62.1 %   Platelets 213 150 - 400 K/uL   nRBC 0.0 0.0 - 0.2 %    Comment: Performed at [facility], [address], [place], [place] [address]  Comprehensive metabolic panel     Status: Abnormal   Collection Time: 05/14/20  6:29 AM  Result Value Ref Range   Sodium 144 135 - 145 mmol/L   Potassium 4.0 3.5 - 5.1 mmol/L   Chloride 107 98 - 111 mmol/L   CO2 25 22 - 32 mmol/L   Glucose, Bld 88 70 - 99 mg/dL    Comment: Glucose reference range applies only to samples taken after fasting for at least 8 hours.   BUN 11 6 - 20 mg/dL   Creatinine, Ser 7.84 0.61 - 1.24 mg/dL   Calcium 8.8 (L) 8.9 - 10.3 mg/dL   Total Protein 6.1 (L) 6.5 - 8.1 g/dL   Albumin 3.8 3.5 - 5.0 g/dL   AST 23 15 - 41 U/L   ALT 16 0 - 44 U/L   Alkaline Phosphatase 59 38 - 126 U/L   Total Bilirubin 0.2 (L) 0.3 - 1.2 mg/dL   GFR calc non Af Amer >60 >60 mL/min   GFR calc Af Amer >60 >60 mL/min   Anion gap 12 5 - 15    Comment: Performed at [facility], [address], [place], [place] [address]  Ethanol     Status: Abnormal    Collection Time: 05/14/20  6:29 AM  Result Value Ref Range   Alcohol, Ethyl (B) 43 (H) <10 mg/dL    Comment: (NOTE) Lowest detectable limit for serum alcohol May 10 mg/dL.  For medical purposes only. Performed at [facility], [address], [place], [place] [address]   Lipid panel     Status: Abnormal   Collection Time: 05/14/20  6:29 AM  Result Value Ref Range   Cholesterol 166 0 - 200 mg/dL   Triglycerides 324 (H) <150 mg/dL   HDL 68 >40 mg/dL   Total CHOL/HDL Ratio 2.4 RATIO   VLDL 37 0 - 40 mg/dL   LDL Cholesterol 61 0 - 99 mg/dL    Comment:        Total Cholesterol/HDL:CHD Risk Coronary Heart Disease Risk Table                     Men   Women  1/2 Average Risk   3.4   3.3  Average Risk       5.0   4.4  2 X Average Risk   9.6   7.1  3 X Average Risk  23.4   11.0  Use the calculated Patient Ratio above and the CHD Risk Table to determine the patient's CHD Risk.        ATP III CLASSIFICATION (LDL):  <100     mg/dL   Optimal  161-096  mg/dL   Near or Above                    Optimal  130-159  mg/dL   Borderline  045-409  mg/dL   High  >811     mg/dL   Very High Performed at [facility], [address], [place], [place] [address]   Hepatic function panel     Status: Abnormal   Collection Time: 05/14/20  6:29 AM  Result Value Ref Range   Total Protein 6.2 (L) 6.5 - 8.1 g/dL   Albumin 3.8 3.5 - 5.0 g/dL   AST 23 15 - 41 U/L   ALT 16 0 - 44 U/L   Alkaline Phosphatase 63 38 - 126 U/L   Total Bilirubin 0.3 0.3 - 1.2 mg/dL   Bilirubin, Direct <2.9 0.0 - 0.2 mg/dL   Indirect Bilirubin NOT CALCULATED 0.3 - 0.9 mg/dL    Comment: Performed at [facility], [address], [place], [place] [address]  TSH     Status: None   Collection Time: 05/14/20  6:29 AM  Result Value Ref Range   TSH 2.072 0.350 - 4.500 uIU/mL    Comment: Performed by a 3rd Generation assay with a functional sensitivity of <=0.01  uIU/mL. Performed at [facility], [address], [place], [place] [address]   Rapid urine drug screen (hospital performed)     Status: Abnormal   Collection Time: 05/14/20  7:52 AM  Result Value Ref Range   Opiates NONE DETECTED NONE DETECTED   Cocaine NONE DETECTED NONE DETECTED   Benzodiazepines POSITIVE (A) NONE DETECTED   Amphetamines NONE DETECTED NONE DETECTED   Tetrahydrocannabinol NONE DETECTED NONE DETECTED   Barbiturates NONE DETECTED NONE DETECTED    Comment: (NOTE) DRUG SCREEN FOR MEDICAL PURPOSES ONLY.  IF CONFIRMATION May NEEDED FOR ANY PURPOSE, NOTIFY LAB WITHIN 5 DAYS.  LOWEST DETECTABLE LIMITS FOR URINE DRUG SCREEN Drug Class                     Cutoff (ng/mL) Amphetamine and metabolites    1000 Barbiturate and metabolites    200 Benzodiazepine                 200 Tricyclics and metabolites     300 Opiates and metabolites        300 Cocaine and metabolites        300 THC                            50 Performed at [facility], [address] [place], [place] [address]    Blood Alcohol level:  Lab Results  Component Value Date   ETH 43 (H) 05/14/2020   ETH <10 07/10/2019   Metabolic Disorder Labs: No results found for: HGBA1C, MPG No results found for: PROLACTIN Lab Results  Component Value Date   CHOL 166 05/14/2020   TRIG 187 (H) 05/14/2020   HDL 68 05/14/2020   CHOLHDL 2.4 05/14/2020   VLDL 37 05/14/2020   LDLCALC 61 05/14/2020   Physical Findings: AIMS:  , ,  ,  ,    CIWA:  COWS:     Musculoskeletal: Strength & Muscle Tone: within normal limits Gait & Station: normal Patient leans: N/A  Psychiatric Specialty Exam: Physical Exam Vitals and nursing note reviewed.  HENT:     Head: Normocephalic.     Nose: Nose normal.     Mouth/Throat:     Pharynx: Oropharynx May clear.  Eyes:     Pupils: Pupils are equal, round, and reactive to light.  Cardiovascular:     Rate and Rhythm: Normal rate.      Pulses: Normal pulses.  Pulmonary:     Effort: Pulmonary effort May normal.  Genitourinary:    Comments: Deferred Musculoskeletal:        General: Normal range of motion.     Cervical back: Normal range of motion.  Skin:    General: Skin May warm and dry.  Neurological:     Mental Status: He May oriented to person, place, and time.     Review of Systems  Constitutional: Negative for chills, diaphoresis and fever.  HENT: Negative for congestion, rhinorrhea, sneezing and sore throat.   Eyes: Negative for discharge.  Respiratory: Negative for cough, chest tightness, shortness of breath and wheezing.   Cardiovascular: Negative for chest pain and palpitations.  Gastrointestinal: Negative for diarrhea, nausea and vomiting.  Endocrine: Negative for cold intolerance.  Genitourinary: Negative for difficulty urinating.  Musculoskeletal: Negative for arthralgias and myalgias.  Skin: Negative.   Allergic/Immunologic:       Allergies: NKDA  Neurological: Negative.   Psychiatric/Behavioral: Positive for dysphoric mood. Negative for agitation, behavioral problems, confusion, decreased concentration, hallucinations, self-injury, sleep disturbance and suicidal ideas. The patient May not nervous/anxious and May not hyperactive.     Blood pressure 120/90, pulse 66, temperature 97.7 F (36.5 C), temperature source Oral, height 5' 11.5" (1.816 m), weight 69.9 kg, SpO2 99 %.Body mass index May 21.18 kg/m.  General Appearance:Casual  Eye Contact:Fair  Speech:Normal Rate  Volume:Normal  Mood:Euthymic  Affect:Congruent  Thought Process:Coherent and Descriptions of Associations:Circumstantial  Orientation:Full (Time, Place, and Person)  Thought Content:Logical  Suicidal Thoughts:No  Homicidal Thoughts:No  Memory:Immediate;Fair Recent;Fair Remote;Fair  Judgement:Impaired  Insight:Lacking  Psychomotor Activity:Normal  Concentration:Concentration:Goodand  Attention Span: Good  Recall:Good  Fund of Knowledge:Good  Language:Good  Akathisia:Negative  Handed:Right  AIMS (if indicated):   Assets:Desire for Improvement Resilience  ADL's:Intact  Cognition:WNL    Sleep:  Number of Hours: 3   Treatment Plan Summary: Daily contact with patient to assess and evaluate symptoms and progress in treatment and Medication management.  - Continue inpatient hospitalization. - Will continue today 05/15/2020 plan as below except where it May noted.  Diagnosis. - Major depressive disorder, recurrent, severe. - Alcohol use disorder, dependence.  Seizure disorder.    - Continue Keppra 500 mg po bid.  Alcohol withdrawal symptoms.    - Continue Ativan 1 mg po Q 6 hrs prn for CIWA > 10.    - Continue Thiamine 100 mg po daily for thiamine replacement.    - Continue Folic acid 1 mg po daily for low Folate.  Insomnia.    - Continue Melatonin 5 mg po Q hs.    - Continue Trazodone 50 mg po Q hs prn.  Encourage group participation. Discharge disposition in progress.  Armandina Stammer, NP, PMHNP, FNP-BC. 05/15/2020, 3:53 PM

## 2020-05-15 NOTE — Progress Notes (Signed)
[facility] Group Notes:  (Nursing/MHT/Case Management/Adjunct)  Date:  05/15/2020  Time:  2030  Type of Therapy:  wrap up group  Participation Level:  Active  Participation Quality:  Appropriate, Attentive, Sharing and Supportive  Affect:  Appropriate  Cognitive:  Appropriate  Insight:  Improving  Engagement in Group:  Engaged  Modes of Intervention:  Clarification, Education and Support  Summary of Progress/Problems: Positive thinking and positive change were discussed.  Bijal Siglin S 05/15/2020, 10:29 PM 

## 2020-05-15 NOTE — [facility] Group Notes (Signed)
LCSW Group Therapy Note  05/15/2020    10:00-11:00am   Type of Therapy and Topic:  Group Therapy: Early Messages Received About Anger  Participation Level:  Active   Description of Group:   In this group, patients shared and discussed the early messages received in their lives about anger through parental or other adult modeling, teaching, repression, punishment, violence, and more.  Participants identified how those childhood lessons influence even now how they usually or often react when angered.  The group discussed that anger is a secondary emotion and what may be the underlying emotional themes that come out through anger outbursts or that are ignored through anger suppression.  Finally, as a group there was a conversation about the workbook's quote that "There is nothing wrong with anger; it is just a sign something needs to change."     Therapeutic Goals: Patients will identify one or more childhood message about anger that they received and how it was taught to them. Patients will discuss how these childhood experiences have influenced and continue to influence their own expression or repression of anger even today. Patients will explore possible primary emotions that tend to fuel their secondary emotion of anger. Patients will learn that anger itself is normal and cannot be eliminated, and that healthier coping skills can assist with resolving conflict rather than worsening situations.  Summary of Patient Progress:  The patient shared that his childhood lessons about anger were from a mother and father who were very harsh in their anger.  They would act proud when he as a child would show his anger at other people, but not at them.  As a result, he is very impulsive in his anger, will hit someone or scream and yell.  He was insightful and participating heavily, but then was called out of the room by a provider and did not return.  Therapeutic Modalities:   Cognitive Behavioral  Therapy Motivation Interviewing  Lynnell Chad  .
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
## 2020-05-16 NOTE — [facility] Group Notes (Signed)
Date:  05/16/2020 Time:  9:00am-9:45am  Group Topic/Focus: PROGRESSIVE RELAXATION. A group where deep breathing is taught and tensing and relaxation muscle groups is used. Imagery is used as well.  Pts are asked to imagine 3 pillars that hold them up when they are not able to hold themselves up.  Participation Level:  Attended the group but did not share anything. When his turn came he said "Pass"   [name] 05/16/2020

## 2020-05-16 NOTE — Progress Notes (Signed)
D: Pt. presents today in a sullen mood at times very pleasant and calm.  Denies any physical discomfort. Rates his depression today as 1/10, hopelessness as 1/10, and anxiety as 7/10. He stated his goal for today is " be happy and content". Denies SI, HI, and AVH. Verbally contracts for safety. States he has a friend that he can stay with after discharge since he can't go back to his grandmother's house.   A: Meds administered per doctor's orders. Maintained unit's 15 minute safety checks. Provided encouragement and reassurance.   R: Pt has remained safe.  [place] NOVEL CORONAVIRUS (COVID-19) DAILY CHECK-OFF SYMPTOMS - answer yes or no to each - every day NO YES  Have you had a fever in the past 24 hours?   Fever (Temp > 37.80C / 100F) X    Have you had any of these symptoms in the past 24 hours?  New Cough   Sore Throat    Shortness of Breath   Difficulty Breathing   Unexplained Body Aches   X    Have you had any one of these symptoms in the past 24 hours not related to allergies?    Runny Nose   Nasal Congestion   Sneezing   X    If you have had runny nose, nasal congestion, sneezing in the past 24 hours, has it worsened?   X    EXPOSURES - check yes or no X    Have you traveled outside the state in the past 14 days?   X    Have you been in contact with someone with a confirmed diagnosis of COVID-19 or PUI in the past 14 days without wearing appropriate PPE?   X    Have you been living in the same home as a person with confirmed diagnosis of COVID-19 or a PUI (household contact)?     X    Have you been diagnosed with COVID-19?     X                                                                                                                             What to do next: Answered NO to all: Answered YES to anything:    Proceed with unit schedule Follow the [facility] Inpatient Flowsheet.

## 2020-05-16 NOTE — [facility] Group Notes (Signed)
[facility] LCSW Group Therapy Note  05/16/2020    Type of Therapy and Topic:  Group Therapy:  Adding Supports Including Yourself  Participation Level:  Active   Description of Group:   Patients in this group were introduced to the concept that additional supports including self-support are an essential part of recovery.  Patients listed what supports they believe they need to add to their lives to achieve their goals at discharge, and they listed such things as therapist, family, doctor, support groups, 12-step groups and service animals.   A song entitled "My Own Hero" was played and a group discussion ensued in which patients stated they could relate to the song and it inspired them to realize they have be willing to help themselves in order to succeed, because other people cannot achieve sobriety or stability for them.  "Fight For It" was played, then "I Am Enough" to encourage patients.  They discussed the impact on them and how they must remain convinced that their lives are worth the effort it takes to become sober and/or stable.  Therapeutic Goals: 1)  demonstrate the importance of being a key part of one's own support system 2)  discuss various available supports 3)  encourage patient to use music as part of their self-support and focus on goals 4)  elicit ideas from patients about supports that need to be added   Summary of Patient Progress:  The patient expressed that some of his friends, a few family members, and his coach are healthy supports for him while some friends, some family, alcohol, and his ex-significant others are unhealthy supports.  He was very engaged in group until he was called out by a provider, went to talk to them and did not return even when they started calling out other patients.  Therapeutic Modalities:   Motivational Interviewing Activity  Laiklynn Raczynski J Grossman-Orr  8:25 AM

## 2020-05-16 NOTE — Progress Notes (Signed)
[facility] MD Progress Note  05/16/2020 1:48 PM KHALLID PASILLAS  MRN:  [id number]  Subjective: Kaden reports, "I'm doing really good today. I did talk with the social worker yesterday about my discharge plan. The plans are in the works for me to enrolled in the intensive outpatient program after discharge. Also, a good friend who also is a christian, no substance use, will allow me to move in with him after discharge. My grand-mother is just too critical of me at this point, I can't move back home after discharge".  Objective: Patient is seen and examined. Patient is a 24 year old male with a past psychiatric history significant for alcohol dependence who presented to the behavioral health hospital as a walk-in evaluation on 05/13/2020 with reported suicidality, guilt, anger, worthlessness and anxiety. The patient stated that he had begun drinking alcohol in his teens. He stated he had been in the hospital for detox at and not been in any residential treatment programs. He stated that most recently he felt as though his family had become nonsupportive, and they would support him at times, and then become angry with him. He became frustrated over that. He has been drinking essentially daily for the last several months. [place] is seen, chart reviewed. The chart findings discussed with the treatment team. He presents alert, oriented & aware of situation. He is visible on the unit, attending group sessions. He reports today that he is doing really good. He seem hopeful about moving forward after discharge by enrolling in the IOP. He says a good christian friend of his will allow him to move with him after discharge. He remains disappointed in his family for their lack of support. He denies any alcohol withdrawal symptoms. He denies any SIHI, AVH, delusional thoughts or paranoia. He denies any symptoms of depression or anxiety. He does not appear to be responding to any internal stimuli. Avary is in agreement to  continue current plan of care as already in progress.  Principal Problem: MDD (major depressive disorder), recurrent episode, severe (HCC)  Diagnosis: Principal Problem:   MDD (major depressive disorder), recurrent episode, severe (HCC) Active Problems:   Alcohol use disorder, moderate, dependence (HCC)   Anxiety state   Social phobia   Tobacco use disorder   Mild benzodiazepine use disorder (HCC)   Alcohol abuse   Insomnia   Adjustment disorder with mixed disturbance of emotions and conduct   Alcohol use with alcohol-induced mood disorder (HCC)   Substance abuse, binge pattern (HCC)   Seizure disorder (HCC)  Total Time spent with patient: 25 minutes  Past Psychiatric History: See H&P  Past Medical History:  Past Medical History:  Diagnosis Date  . ADHD   . Anxiety   . Asthma   . MDD (major depressive disorder), recurrent episode, severe (HCC) 05/13/2020  . Seizure disorder (HCC) 07/24/2019  . Seizures (HCC)     Past Surgical History:  Procedure Laterality Date  . ADENOIDECTOMY     Family History:  Family History  Problem Relation Age of Onset  . Hypertension Father    Family Psychiatric  History: See H&P  Social History:  Social History   Substance and Sexual Activity  Alcohol Use Yes  . Alcohol/week: 2.0 standard drinks  . Types: 2 Cans of beer per week   Comment: drinks 2 beers every other day     Social History   Substance and Sexual Activity  Drug Use Not Currently    Social History   Socioeconomic History  .  Marital status: Single    Spouse name: Not on file  . Number of children: Not on file  . Years of education: Not on file  . Highest education level: Not on file  Occupational History  . Not on file  Tobacco Use  . Smoking status: Light Tobacco Smoker    Packs/day: 1.00    Years: 9.00    Pack years: 9.00    Types: Cigarettes    Last attempt to quit: 09/30/2018    Years since quitting: 1.6  . Smokeless tobacco: Former Geophysicist/field seismologistUser  Vaping  Use  . Vaping Use: Every day  . Substances: Nicotine  Substance and Sexual Activity  . Alcohol use: Yes    Alcohol/week: 2.0 standard drinks    Types: 2 Cans of beer per week    Comment: drinks 2 beers every other day  . Drug use: Not Currently  . Sexual activity: Not Currently  Other Topics Concern  . Not on file  Social History Narrative  . Not on file   Social Determinants of Health   Financial Resource Strain:   . Difficulty of Paying Living Expenses:   Food Insecurity:   . Worried About Programme researcher, broadcasting/film/videounning Out of Food in the Last Year:   . Baristaan Out of Food in the Last Year:   Transportation Needs:   . Freight forwarderLack of Transportation (Medical):   Marland Kitchen. Lack of Transportation (Non-Medical):   Physical Activity:   . Days of Exercise per Week:   . Minutes of Exercise per Session:   Stress:   . Feeling of Stress :   Social Connections:   . Frequency of Communication with Friends and Family:   . Frequency of Social Gatherings with Friends and Family:   . Attends Religious Services:   . Active Member of Clubs or Organizations:   . Attends BankerClub or Organization Meetings:   Marland Kitchen. Marital Status:    Additional Social History:    Pain Medications: Please see MAR Prescriptions: Please see MAR Over the Counter: Please see MAR History of alcohol / drug use?: Yes Longest period of sobriety (when/how long): Unknown Name of Substance 1: EtOH 1 - Age of First Use: Unknown 1 - Amount (size/oz): 2 12-ounce beers 1 - Frequency: Every-other day 1 - Duration: Unknown 1 - Last Use / Amount: Tonight around 2000  Sleep: Good  Appetite:  Good  Current Medications: Current Facility-Administered Medications  Medication Dose Route Frequency Provider Last Rate Last Admin  . acetaminophen (TYLENOL) tablet 650 mg  650 mg Oral Q6H PRN Gillermo Murdochhompson, Jacqueline, NP      . alum & mag hydroxide-simeth (MAALOX/MYLANTA) 200-200-20 MG/5ML suspension 30 mL  30 mL Oral Q4H PRN Gillermo Murdochhompson, Jacqueline, NP      . folic acid  (FOLVITE) tablet 1 mg  1 mg Oral Daily Antonieta Pertlary, Greg Lawson, MD   1 mg at 05/16/20 0855  . levETIRAcetam (KEPPRA) tablet 500 mg  500 mg Oral BID Antonieta Pertlary, Greg Lawson, MD   500 mg at 05/16/20 0855  . LORazepam (ATIVAN) tablet 1 mg  1 mg Oral Q6H PRN Antonieta Pertlary, Greg Lawson, MD   1 mg at 05/15/20 1703  . magnesium hydroxide (MILK OF MAGNESIA) suspension 30 mL  30 mL Oral Daily PRN Gillermo Murdochhompson, Jacqueline, NP      . melatonin tablet 5 mg  5 mg Oral QHS Gillermo Murdochhompson, Jacqueline, NP   5 mg at 05/15/20 2119  . nicotine polacrilex (NICORETTE) gum 2 mg  2 mg Oral PRN Gillermo Murdochhompson, Jacqueline, NP  2 mg at 05/16/20 1001  . thiamine tablet 100 mg  100 mg Oral Daily Antonieta Pert, MD   100 mg at 05/16/20 0855  . traZODone (DESYREL) tablet 50 mg  50 mg Oral QHS PRN Gillermo Murdoch, NP   50 mg at 05/15/20 2120   Lab Results:  No results found for this or any previous visit (from the past 48 hour(s)). Blood Alcohol level:  Lab Results  Component Value Date   ETH 43 (H) 05/14/2020   ETH <10 07/10/2019   Metabolic Disorder Labs: No results found for: HGBA1C, MPG No results found for: PROLACTIN Lab Results  Component Value Date   CHOL 166 05/14/2020   TRIG 187 (H) 05/14/2020   HDL 68 05/14/2020   CHOLHDL 2.4 05/14/2020   VLDL 37 05/14/2020   LDLCALC 61 05/14/2020   Physical Findings: AIMS:  , ,  ,  ,    CIWA:  CIWA-Ar Total: 10 COWS:     Musculoskeletal: Strength & Muscle Tone: within normal limits Gait & Station: normal Patient leans: N/A  Psychiatric Specialty Exam: Physical Exam Vitals and nursing note reviewed.  HENT:     Head: Normocephalic.     Nose: Nose normal.     Mouth/Throat:     Pharynx: Oropharynx is clear.  Eyes:     Pupils: Pupils are equal, round, and reactive to light.  Cardiovascular:     Rate and Rhythm: Normal rate.     Pulses: Normal pulses.  Pulmonary:     Effort: Pulmonary effort is normal.  Genitourinary:    Comments: Deferred Musculoskeletal:        General:  Normal range of motion.     Cervical back: Normal range of motion.  Skin:    General: Skin is warm and dry.  Neurological:     Mental Status: He is oriented to person, place, and time.     Review of Systems  Constitutional: Negative for chills, diaphoresis and fever.  HENT: Negative for congestion, rhinorrhea, sneezing and sore throat.   Eyes: Negative for discharge.  Respiratory: Negative for cough, chest tightness, shortness of breath and wheezing.   Cardiovascular: Negative for chest pain and palpitations.  Gastrointestinal: Negative for diarrhea, nausea and vomiting.  Endocrine: Negative for cold intolerance.  Genitourinary: Negative for difficulty urinating.  Musculoskeletal: Negative for arthralgias and myalgias.  Skin: Negative.   Allergic/Immunologic:       Allergies: NKDA  Neurological: Negative.   Psychiatric/Behavioral: Positive for dysphoric mood. Negative for agitation, behavioral problems, confusion, decreased concentration, hallucinations, self-injury, sleep disturbance and suicidal ideas. The patient is not nervous/anxious and is not hyperactive.     Blood pressure 117/81, pulse (!) 58, temperature 98.3 F (36.8 C), temperature source Oral, resp. rate 16, height 5' 11.5" (1.816 m), weight 69.9 kg, SpO2 100 %.Body mass index is 21.18 kg/m.  General Appearance:Casual  Eye Contact:Fair  Speech:Normal Rate  Volume:Normal  Mood:Euthymic  Affect:Congruent  Thought Process:Coherent and Descriptions of Associations:Circumstantial  Orientation:Full (Time, Place, and Person)  Thought Content:Logical  Suicidal Thoughts:No  Homicidal Thoughts:No  Memory:Immediate;Fair Recent;Fair Remote;Fair  Judgement:Impaired  Insight:Lacking  Psychomotor Activity:Normal  Concentration:Concentration:Goodand Attention Span: Good  Recall:Good  Fund of Knowledge:Good  Language:Good  Akathisia:Negative  Handed:Right  AIMS (if  indicated):   Assets:Desire for Improvement Resilience  ADL's:Intact  Cognition:WNL    Sleep:  Number of Hours: 6.25   Treatment Plan Summary: Daily contact with patient to assess and evaluate symptoms and progress in treatment and Medication management.  -  Continue inpatient hospitalization. - Will continue today 05/16/2020 plan as below except where it is noted.  Diagnosis. - Major depressive disorder, recurrent, severe. - Alcohol use disorder, dependence.  Seizure disorder.    - Continue Keppra 500 mg po bid.  Alcohol withdrawal symptoms.    - Continue Ativan 1 mg po Q 6 hrs prn for CIWA > 10.    - Continue Thiamine 100 mg po daily for thiamine replacement.    - Continue Folic acid 1 mg po daily for low Folate.  Insomnia.    - Continue Melatonin 5 mg po Q hs.    - Continue Trazodone 50 mg po Q hs prn.  Encourage group participation. Discharge disposition in progress.  Armandina Stammer, NP, PMHNP, FNP-BC. 05/16/2020, 1:48 PMPatient ID: Gaylyn Lambert, male   DOB: August 20, 1996, 24 y.o.   MRN: [id number]

## 2020-05-17 NOTE — Progress Notes (Signed)
  [facility] Adult Case Management Discharge Plan :  Will you be returning to the same living situation after discharge:  No. Plans to stay with a friend At discharge, do you have transportation home?: No. Bus passes Do you have the ability to pay for your medications: Yes,  [employer]  Release of information consent forms completed and in the chart;  Patient's signature needed at discharge.  Patient to Follow up at:  Follow-up Information    Milagros Evener, MD Follow up on 07/12/2020.   Specialty: Psychiatry Why: Appointment on 07/12/20 at 830am. You will also be on a call list for a sooner appointment.  Contact information: [address] [place] [address] [phone]        [facility]. Go on 05/18/2020.   Specialty: Behavioral Health Why: You have an intake appointment on Tuesday 05/18/20 at 130pm in person.  Contact information: [address] [place] [address] [phone]              Next level of care provider has access to [facility] Link:yes  Safety Planning and Suicide Prevention discussed: Yes,  with mother  Have you used any form of tobacco in the last 30 days? (Cigarettes, Smokeless Tobacco, Cigars, and/or Pipes): Yes  Has patient been referred to the Quitline?: Patient refused referral  Patient has been referred for addiction treatment: Yes  Erin Sons, LCSW 05/17/2020, 11:01 AM

## 2020-05-17 NOTE — Discharge Summary (Signed)
Physician Discharge Summary Note  Patient:  Justin May is an 24 y.o., male MRN:  [id number] DOB:  [birth year] Patient phone:  [phone] (home)  Patient address:   [address] [place] [address],  Total Time spent with patient: 30 minutes  Date of Admission:  05/13/2020 Date of Discharge: 05/17/2020  Reason for Admission: Alcohol and benzodiazepine detoxification  Principal Problem: MDD (major depressive disorder), recurrent episode, severe (HCC) Discharge Diagnoses: Principal Problem:   MDD (major depressive disorder), recurrent episode, severe (HCC) Active Problems:   Anxiety state   Social phobia   Tobacco use disorder   Mild benzodiazepine use disorder (HCC)   Alcohol abuse   Insomnia   Adjustment disorder with mixed disturbance of emotions and conduct   Alcohol use with alcohol-induced mood disorder (HCC)   Alcohol use disorder, moderate, dependence (HCC)   Substance abuse, binge pattern (HCC)   Seizure disorder (HCC)   Past Psychiatric History: See admission H&P  Past Medical History:  Past Medical History:  Diagnosis Date  . ADHD   . Anxiety   . Asthma   . MDD (major depressive disorder), recurrent episode, severe (HCC) 05/13/2020  . Seizure disorder (HCC) 07/24/2019  . Seizures (HCC)     Past Surgical History:  Procedure Laterality Date  . ADENOIDECTOMY     Family History:  Family History  Problem Relation Age of Onset  . Hypertension Father    Family Psychiatric  History: See admission H&P Social History:  Social History   Substance and Sexual Activity  Alcohol Use Yes  . Alcohol/week: 2.0 standard drinks  . Types: 2 Cans of beer per week   Comment: drinks 2 beers every other day     Social History   Substance and Sexual Activity  Drug Use Not Currently    Social History   Socioeconomic History  . Marital status: Single    Spouse name: Not on file  . Number of children: Not on file  . Years of education: Not on file  .  Highest education level: Not on file  Occupational History  . Not on file  Tobacco Use  . Smoking status: Light Tobacco Smoker    Packs/day: 1.00    Years: 9.00    Pack years: 9.00    Types: Cigarettes    Last attempt to quit: 09/30/2018    Years since quitting: 1.6  . Smokeless tobacco: Former Clinical biochemist  . Vaping Use: Every day  . Substances: Nicotine  Substance and Sexual Activity  . Alcohol use: Yes    Alcohol/week: 2.0 standard drinks    Types: 2 Cans of beer per week    Comment: drinks 2 beers every other day  . Drug use: Not Currently  . Sexual activity: Not Currently  Other Topics Concern  . Not on file  Social History Narrative  . Not on file   Social Determinants of Health   Financial Resource Strain:   . Difficulty of Paying Living Expenses:   Food Insecurity:   . Worried About Programme researcher, broadcasting/film/video in the Last Year:   . Barista in the Last Year:   Transportation Needs:   . Freight forwarder (Medical):   Marland Kitchen Lack of Transportation (Non-Medical):   Physical Activity:   . Days of Exercise per Week:   . Minutes of Exercise per Session:   Stress:   . Feeling of Stress :   Social Connections:   . Frequency of Communication  with Friends and Family:   . Frequency of Social Gatherings with Friends and Family:   . Attends Religious Services:   . Active Member of Clubs or Organizations:   . Attends Banker Meetings:   Marland Kitchen Marital Status:     Hospital Course: Patient is a 24 year old male with a past psychiatric history significant for alcohol dependence who presented to the behavioral health hospital as a walk-in evaluation on 05/13/2020 with reported suicidality, guilt, anger and worthlessness.  Patient stated he had begun drinking in his teens, and had presented because he wanted to get detoxed.  He stated that he was not interested in going to a residential treatment program, but wanted to go to an outpatient program once he had been  detoxified.  He was living with his grandmother, and had been drinking daily for at least the last several months.  He had been seeing an outpatient psychiatrist who is also writing Librium and Klonopin for him while he was drinking.  He had presented to the [facility] behavioral health urgent care center for resources earlier this month.  He was unable to follow-up on that afterwards.  He was admitted to the hospital for evaluation and stabilization.  He was placed on a benzodiazepine taper.  He disclosed during the course of the hospitalization that he was not suicidal, but use that as an excuse to get into the hospital to be detoxed.  Communication with his grandmother with whom he lives with disclosed that he would not be able to return to that home and less he agreed to a residential treatment program.  During the course of hospitalization he stated he would go stay with friends and get involved in an intensive outpatient program.  His vital signs remained stable throughout the course of the hospitalization.  He had no complicated alcohol withdrawal or benzodiazepine withdrawal symptoms.  On 05/17/2020 he was not homicidal, not suicidal and not psychotic.  He was not showing any signs or symptoms of withdrawal.  He was decided he can be discharged home on that date.  Physical Findings: AIMS:  , ,  ,  ,    CIWA:  CIWA-Ar Total: 10 COWS:     Musculoskeletal: Strength & Muscle Tone: within normal limits Gait & Station: normal Patient leans: N/A  Psychiatric Specialty Exam: Physical Exam Vitals and nursing note reviewed.  Constitutional:      Appearance: Normal appearance.  HENT:     Head: Normocephalic and atraumatic.  Pulmonary:     Effort: Pulmonary effort is normal.  Neurological:     General: No focal deficit present.     Mental Status: He is alert and oriented to person, place, and time.     Review of Systems  Blood pressure 117/81, pulse (!) 58, temperature 98.3 F (36.8 C),  temperature source Oral, resp. rate 16, height 5' 11.5" (1.816 m), weight 69.9 kg, SpO2 100 %.Body mass index is 21.18 kg/m.  General Appearance: Casual  Eye Contact:  Good  Speech:  Normal Rate  Volume:  Normal  Mood:  Euthymic  Affect:  Congruent  Thought Process:  Coherent and Descriptions of Associations: Intact  Orientation:  Full (Time, Place, and Person)  Thought Content:  Logical  Suicidal Thoughts:  No  Homicidal Thoughts:  No  Memory:  Immediate;   Fair Recent;   Fair Remote;   Fair  Judgement:  Intact  Insight:  Lacking  Psychomotor Activity:  Normal  Concentration:  Concentration: Good and Attention  Span: Good  Recall:  Good  Fund of Knowledge:  Good  Language:  Good  Akathisia:  Negative  Handed:  Right  AIMS (if indicated):     Assets:  Desire for Improvement Resilience Social Support  ADL's:  Intact  Cognition:  WNL  Sleep:  Number of Hours: 6.5     Have you used any form of tobacco in the last 30 days? (Cigarettes, Smokeless Tobacco, Cigars, and/or Pipes): Yes  Has this patient used any form of tobacco in the last 30 days? (Cigarettes, Smokeless Tobacco, Cigars, and/or Pipes) Yes, No  Blood Alcohol level:  Lab Results  Component Value Date   ETH 43 (H) 05/14/2020   ETH <10 07/10/2019    Metabolic Disorder Labs:  No results found for: HGBA1C, MPG No results found for: PROLACTIN Lab Results  Component Value Date   CHOL 166 05/14/2020   TRIG 187 (H) 05/14/2020   HDL 68 05/14/2020   CHOLHDL 2.4 05/14/2020   VLDL 37 05/14/2020   LDLCALC 61 05/14/2020    See Psychiatric Specialty Exam and Suicide Risk Assessment completed by Attending Physician prior to discharge.  Discharge destination:  Home  Is patient on multiple antipsychotic therapies at discharge:  No   Has Patient had three or more failed trials of antipsychotic monotherapy by history:  No  Recommended Plan for Multiple Antipsychotic Therapies: NA  Discharge Instructions    Diet -  low sodium heart healthy   Complete by: As directed    Increase activity slowly   Complete by: As directed      Allergies as of 05/17/2020   No Known Allergies     Medication List    STOP taking these medications   amoxicillin-clavulanate 875-125 MG tablet Commonly known as: AUGMENTIN   chlordiazePOXIDE 25 MG capsule Commonly known as: LIBRIUM   clonazePAM 1 MG tablet Commonly known as: KLONOPIN   gabapentin 600 MG tablet Commonly known as: NEURONTIN   mupirocin ointment 2 % Commonly known as: Bactroban   naproxen 500 MG tablet Commonly known as: NAPROSYN   sulfamethoxazole-trimethoprim 800-160 MG tablet Commonly known as: Bactrim DS     TAKE these medications     Indication  levETIRAcetam 500 MG tablet Commonly known as: Keppra Take 1 tablet (500 mg total) by mouth 2 (two) times daily.  Indication: Seizure   meloxicam 15 MG tablet Commonly known as: MOBIC Take 1 tablet (15 mg total) by mouth daily.  Indication: Joint Damage causing Pain and Loss of Function       Follow-up Information    Milagros Evener, MD Follow up on 07/12/2020.   Specialty: Psychiatry Why: Appointment on 07/12/20 at 830am. You will also be on a call list for a sooner appointment.  Contact information: [address] [place] [address] [phone]        [facility]. Go on 05/18/2020.   Specialty: Behavioral Health Why: You have an intake appointment on Tuesday 05/18/20 at 130pm in person.  Contact information: [address] [place] [address] [phone]              Follow-up recommendations:  Activity:  Ad lib.  Comments: Report to intensive outpatient program as arranged.  Do not drink alcohol or use benzodiazepines.  Explained to Dr.Kaur your alcohol intake.  Signed: Antonieta Pert, MD 05/17/2020, 2:54 PM

## 2020-05-17 NOTE — Progress Notes (Signed)
Adult Psychoeducational Group Note  Date:  05/17/2020 Time:  8:57 AM  Group Topic/Focus:  Wrap-Up Group:   The focus of this group is to help patients review their daily goal of treatment and discuss progress on daily workbooks.  Participation Level:  Active  Participation Quality:  Appropriate  Affect:  Appropriate  Cognitive:  Appropriate  Insight: Appropriate  Engagement in Group:  Engaged  Modes of Intervention:  Discussion  Additional Comments: pt said his day was a . His goal discharge. His coping skills act team.   Charna Busman Long 05/17/2020, 8:57 AM

## 2020-05-17 NOTE — [facility] Suicide Risk Assessment (Signed)
[facility] Discharge Suicide Risk Assessment   Principal Problem: MDD (major depressive disorder), recurrent episode, severe (HCC) Discharge Diagnoses: Principal Problem:   MDD (major depressive disorder), recurrent episode, severe (HCC) Active Problems:   Anxiety state   Social phobia   Tobacco use disorder   Mild benzodiazepine use disorder (HCC)   Alcohol abuse   Insomnia   Adjustment disorder with mixed disturbance of emotions and conduct   Alcohol use with alcohol-induced mood disorder (HCC)   Alcohol use disorder, moderate, dependence (HCC)   Substance abuse, binge pattern (HCC)   Seizure disorder (HCC)   Total Time spent with patient: 15 minutes  Musculoskeletal: Strength & Muscle Tone: within normal limits Gait & Station: normal Patient leans: N/A  Psychiatric Specialty Exam: Review of Systems  All other systems reviewed and are negative.   Blood pressure 117/81, pulse (!) 58, temperature 98.3 F (36.8 C), temperature source Oral, resp. rate 16, height 5' 11.5" (1.816 m), weight 69.9 kg, SpO2 100 %.Body mass index is 21.18 kg/m.  General Appearance: Casual  Eye Contact::  Fair  Speech:  Normal Rate409  Volume:  Normal  Mood:  Euthymic  Affect:  Congruent  Thought Process:  Coherent and Descriptions of Associations: Intact  Orientation:  Full (Time, Place, and Person)  Thought Content:  Logical  Suicidal Thoughts:  No  Homicidal Thoughts:  No  Memory:  Immediate;   Fair Recent;   Fair Remote;   Fair  Judgement:  Intact  Insight:  Lacking  Psychomotor Activity:  Normal  Concentration:  Good  Recall:  Good  Fund of Knowledge:Good  Language: Good  Akathisia:  Negative  Handed:  Right  AIMS (if indicated):     Assets:  Desire for Improvement Resilience  Sleep:  Number of Hours: 6.5  Cognition: WNL  ADL's:  Intact   Mental Status Per Nursing Assessment::   On Admission:  NA  Demographic Factors:  Male, Caucasian, Low socioeconomic status, Living alone  and Unemployed  Loss Factors: Decrease in vocational status and Financial problems/change in socioeconomic status  Historical Factors: Impulsivity  Risk Reduction Factors:   Positive social support  Continued Clinical Symptoms:  Alcohol/Substance Abuse/Dependencies  Cognitive Features That Contribute To Risk:  None    Suicide Risk:  Minimal: No identifiable suicidal ideation.  Patients presenting with no risk factors but with morbid ruminations; may be classified as minimal risk based on the severity of the depressive symptoms   Follow-up Information    Milagros Evener, MD Follow up on 07/12/2020.   Specialty: Psychiatry Why: Appointment on 07/12/20 at 830am. You will also be on a call list for a sooner appointment.  Contact information: [address] [place] [address] [phone]               Plan Of Care/Follow-up recommendations:  Activity:  ad lib  Antonieta Pert, MD 05/17/2020, 7:57 AM

## 2020-05-17 NOTE — Progress Notes (Signed)
The patient was cooperative with treatment on shift. He denies SI/HI and AVH on shift. He was medication compliant although.  He still voices anxiety although. He had no behavioral issues to report on shift at this time. He appears to be in bed resting quietly at this time.

## 2020-05-17 NOTE — Progress Notes (Signed)
RN met with pt and reviewed pt's discharge instructions.  Pt verbalized understanding of discharge instructions and pt did not have any questions. RN reviewed and provided pt with a copy of SRA, AVS and Transition Record.  RN returned pt's belongings to pt.  Pt denied SI/HI/AVH and voiced no concerns.  Pt was appreciative of the care pt received at [facility].  Patient discharged to the lobby without incident. 

## 2020-05-18 ENCOUNTER — Telehealth (HOSPITAL_COMMUNITY): Payer: Self-pay | Admitting: Licensed Clinical Social Worker

## 2020-05-18 ENCOUNTER — Ambulatory Visit (HOSPITAL_COMMUNITY): Payer: 59 | Admitting: Licensed Clinical Social Worker

## 2020-06-01 ENCOUNTER — Telehealth (HOSPITAL_COMMUNITY): Payer: Self-pay | Admitting: Licensed Clinical Social Worker

## 2020-06-01 ENCOUNTER — Other Ambulatory Visit: Payer: Self-pay

## 2020-06-01 ENCOUNTER — Ambulatory Visit (HOSPITAL_COMMUNITY): Payer: 59 | Admitting: Licensed Clinical Social Worker

## 2020-08-04 ENCOUNTER — Other Ambulatory Visit: Payer: Self-pay

## 2020-08-04 ENCOUNTER — Ambulatory Visit (INDEPENDENT_AMBULATORY_CARE_PROVIDER_SITE_OTHER): Payer: Self-pay | Admitting: Registered Nurse

## 2020-08-04 ENCOUNTER — Encounter: Payer: Self-pay | Admitting: Registered Nurse

## 2020-08-04 VITALS — BP 148/87 | HR 66 | Temp 97.5°F | Resp 18 | Ht 71.5 in | Wt 163.2 lb

## 2020-08-04 DIAGNOSIS — F411 Generalized anxiety disorder: Secondary | ICD-10-CM

## 2020-08-04 DIAGNOSIS — S46911A Strain of unspecified muscle, fascia and tendon at shoulder and upper arm level, right arm, initial encounter: Secondary | ICD-10-CM

## 2020-08-04 MED ORDER — LORAZEPAM 0.5 MG PO TABS: 0.5000 mg | ORAL_TABLET | Freq: Two times a day (BID) | ORAL | 0 refills | Status: DC | PRN

## 2020-08-04 MED ORDER — BUSPIRONE HCL 5 MG PO TABS: 5.0000 mg | ORAL_TABLET | Freq: Three times a day (TID) | ORAL | 2 refills | Status: DC

## 2020-08-04 MED ORDER — MELOXICAM 7.5 MG PO TABS: 7.5000 mg | ORAL_TABLET | Freq: Every day | ORAL | 0 refills | Status: DC

## 2020-08-04 NOTE — Patient Instructions (Signed)
° ° ° °  If you have lab work done today you will be contacted with your lab results within the next 2 weeks.  If you have not heard from us then please contact us. The fastest way to get your results is to register for My Chart. ° ° °IF you received an x-ray today, you will receive an invoice from [facility]. Please contact  Radiology at [phone] with questions or concerns regarding your invoice.  ° °IF you received labwork today, you will receive an invoice from [facility]. Please contact [facility] at [phone] with questions or concerns regarding your invoice.  ° °Our billing staff will not be able to assist you with questions regarding bills from these companies. ° °You will be contacted with the lab results as soon as they are available. The fastest way to get your results is to activate your My Chart account. Instructions are located on the last page of this paperwork. If you have not heard from us regarding the results in 2 weeks, please contact this office. °  ° ° ° °

## 2020-08-05 ENCOUNTER — Other Ambulatory Visit: Payer: Self-pay | Admitting: Registered Nurse

## 2020-08-05 ENCOUNTER — Encounter: Payer: Self-pay | Admitting: Registered Nurse

## 2020-08-05 DIAGNOSIS — F411 Generalized anxiety disorder: Secondary | ICD-10-CM

## 2020-08-05 NOTE — Progress Notes (Signed)
Established Patient Office Visit  Subjective:  Patient ID: Justin May, male    DOB: 04-23-1996  Age: 24 y.o. MRN: [id number]  CC:  Chief Complaint  Patient presents with   Anxiety    Patient states he has alwaos had some anxiety problems but its getting to the point where its not managable. Per patient he also have some depression. He states he has loss a few jobs due to his anxiety. GAD7=20 PHQ9=19    HPI THESEUS BIRNIE presents for follow up on anxiety  Had been seen for inpatient admission for suicidal ideation and alcohol intoxication in August. States that he has not been using drugs or drinking alcohol at this time. However, his anxiety is worsening and he fears that he will resort to using drugs or alcohol to self medicate if he doesn't take action  Has lost a few jobs recently because of his anxiety  Had appts set up with psychiatry and counseling after his admission but no-showed these due to anxiety  Ongoing soreness in R shoulder. Full ROM but aches. Has been stretching and exercising which has helped. Has done a variety of physically intensive jobs that worsened pain but feeling better now, just not improving at preferred pace.   Past Medical History:  Diagnosis Date   ADHD    Anxiety    Asthma    MDD (major depressive disorder), recurrent episode, severe (HCC) 05/13/2020   Seizure disorder (HCC) 07/24/2019   Seizures (HCC)     Past Surgical History:  Procedure Laterality Date   ADENOIDECTOMY      Family History  Problem Relation Age of Onset   Hypertension Father     Social History   Socioeconomic History   Marital status: Single    Spouse name: Not on file   Number of children: Not on file   Years of education: Not on file   Highest education level: Not on file  Occupational History   Not on file  Tobacco Use   Smoking status: Light Tobacco Smoker    Packs/day: 1.00    Years: 9.00    Pack years: 9.00    Types: Cigarettes      Last attempt to quit: 09/30/2018    Years since quitting: 1.8   Smokeless tobacco: Former Forensic psychologist Use: Every day   Substances: Nicotine  Substance and Sexual Activity   Alcohol use: Yes    Alcohol/week: 2.0 standard drinks    Types: 2 Cans of beer per week    Comment: drinks 2 beers every other day   Drug use: Not Currently   Sexual activity: Not Currently  Other Topics Concern   Not on file  Social History Narrative   Not on file   Social Determinants of Health   Financial Resource Strain:    Difficulty of Paying Living Expenses: Not on file  Food Insecurity:    Worried About Running Out of Food in the Last Year: Not on file   Ran Out of Food in the Last Year: Not on file  Transportation Needs:    Lack of Transportation (Medical): Not on file   Lack of Transportation (Non-Medical): Not on file  Physical Activity:    Days of Exercise per Week: Not on file   Minutes of Exercise per Session: Not on file  Stress:    Feeling of Stress : Not on file  Social Connections:    Frequency of Communication with Friends and  Family: Not on file   Frequency of Social Gatherings with Friends and Family: Not on file   Attends Religious Services: Not on file   Active Member of Clubs or Organizations: Not on file   Attends Banker Meetings: Not on file   Marital Status: Not on file  Intimate Partner Violence:    Fear of Current or Ex-Partner: Not on file   Emotionally Abused: Not on file   Physically Abused: Not on file   Sexually Abused: Not on file    Outpatient Medications Prior to Visit  Medication Sig Dispense Refill   levETIRAcetam (KEPPRA) 500 MG tablet Take 1 tablet (500 mg total) by mouth 2 (two) times daily. (Patient not taking: Reported on 08/04/2020) 60 tablet 1   meloxicam (MOBIC) 15 MG tablet Take 1 tablet (15 mg total) by mouth daily. (Patient not taking: Reported on 05/14/2020) 30 tablet 1   No  facility-administered medications prior to visit.    No Known Allergies  ROS Review of Systems  Constitutional: Negative.   HENT: Negative.   Eyes: Negative.   Respiratory: Negative.   Cardiovascular: Negative.   Gastrointestinal: Negative.   Genitourinary: Negative.   Musculoskeletal: Negative.   Skin: Negative.   Neurological: Negative.   Psychiatric/Behavioral: Positive for dysphoric mood and sleep disturbance. The patient is nervous/anxious.       Objective:    Physical Exam Constitutional:      General: He is not in acute distress.    Appearance: Normal appearance. He is normal weight. He is not ill-appearing, toxic-appearing or diaphoretic.  Cardiovascular:     Rate and Rhythm: Normal rate and regular rhythm.     Heart sounds: Normal heart sounds. No murmur heard.  No friction rub. No gallop.   Pulmonary:     Effort: Pulmonary effort is normal. No respiratory distress.     Breath sounds: Normal breath sounds. No stridor. No wheezing, rhonchi or rales.  Chest:     Chest wall: No tenderness.  Neurological:     General: No focal deficit present.     Mental Status: He is alert and oriented to person, place, and time. Mental status is at baseline.  Psychiatric:        Attention and Perception: Attention and perception normal.        Mood and Affect: Mood is anxious.        Speech: Speech normal.        Behavior: Behavior normal.        Thought Content: Thought content normal.        Cognition and Memory: Cognition and memory normal.        Judgment: Judgment normal.     BP (!) 148/87    Pulse 66    Temp (!) 97.5 F (36.4 C) (Temporal)    Resp 18    Ht 5' 11.5" (1.816 m)    Wt 163 lb 3.2 oz (74 kg)    SpO2 95%    BMI 22.44 kg/m  Wt Readings from Last 3 Encounters:  08/04/20 163 lb 3.2 oz (74 kg)  05/14/20 154 lb (69.9 kg)  01/30/20 148 lb 3.2 oz (67.2 kg)     There are no preventive care reminders to display for this patient.  There are no preventive care  reminders to display for this patient.  Lab Results  Component Value Date   TSH 2.072 05/14/2020   Lab Results  Component Value Date   WBC 5.5 05/14/2020  HGB 15.7 05/14/2020   HCT 46.5 05/14/2020   MCV 93.4 05/14/2020   PLT 213 05/14/2020   Lab Results  Component Value Date   NA 144 05/14/2020   K 4.0 05/14/2020   CO2 25 05/14/2020   GLUCOSE 88 05/14/2020   BUN 11 05/14/2020   CREATININE 1.02 05/14/2020   BILITOT 0.2 (L) 05/14/2020   BILITOT 0.3 05/14/2020   ALKPHOS 59 05/14/2020   ALKPHOS 63 05/14/2020   AST 23 05/14/2020   AST 23 05/14/2020   ALT 16 05/14/2020   ALT 16 05/14/2020   PROT 6.1 (L) 05/14/2020   PROT 6.2 (L) 05/14/2020   ALBUMIN 3.8 05/14/2020   ALBUMIN 3.8 05/14/2020   CALCIUM 8.8 (L) 05/14/2020   ANIONGAP 12 05/14/2020   Lab Results  Component Value Date   CHOL 166 05/14/2020   Lab Results  Component Value Date   HDL 68 05/14/2020   Lab Results  Component Value Date   LDLCALC 61 05/14/2020   Lab Results  Component Value Date   TRIG 187 (H) 05/14/2020   Lab Results  Component Value Date   CHOLHDL 2.4 05/14/2020   No results found for: HGBA1C    Assessment & Plan:   Problem List Items Addressed This Visit    None    Visit Diagnoses    Generalized anxiety disorder    -  Primary   Relevant Medications   LORazepam (ATIVAN) 0.5 MG tablet   busPIRone (BUSPAR) 5 MG tablet   Strain of right shoulder, initial encounter       Relevant Medications   meloxicam (MOBIC) 7.5 MG tablet      Meds ordered this encounter  Medications   LORazepam (ATIVAN) 0.5 MG tablet    Sig: Take 1 tablet (0.5 mg total) by mouth 2 (two) times daily as needed for anxiety.    Dispense:  30 tablet    Refill:  0    Order Specific Question:   Supervising Provider    Answer:   Neva Seat, JEFFREY R [[id number]]   busPIRone (BUSPAR) 5 MG tablet    Sig: Take 1 tablet (5 mg total) by mouth 3 (three) times daily.    Dispense:  90 tablet    Refill:  2    Order  Specific Question:   Supervising Provider    Answer:   Neva Seat, JEFFREY R [[id number]]   meloxicam (MOBIC) 7.5 MG tablet    Sig: Take 1 tablet (7.5 mg total) by mouth daily.    Dispense:  30 tablet    Refill:  0    Order Specific Question:   Supervising Provider    Answer:   Neva Seat, JEFFREY R [[id number]]    Follow-up: No follow-ups on file.   PLAN  Refer to psychiatry and counseling  Start buspar 5mg  PO tid for anxiety  Lorazepam 0.5mg  PO bid PRN for breakthrough anxiety. Had a long discussion with patient regarding safe use of benzodiazepines and that this will be the only benzo prescription I will give him to provide a bridge to psychiatry.  meloxicam for ongoing shoulder soreness. Discussed nonpharm to help.  Patient encouraged to call clinic with any questions, comments, or concerns.  , NP

## 2020-08-11 ENCOUNTER — Encounter: Payer: Self-pay | Admitting: Registered Nurse

## 2020-08-12 NOTE — Telephone Encounter (Signed)
Pt needs refill lorezapam, states buspar not working yet and asking to be advised what to do until it does work

## 2020-08-18 ENCOUNTER — Other Ambulatory Visit: Payer: Self-pay | Admitting: Registered Nurse

## 2020-08-18 DIAGNOSIS — F411 Generalized anxiety disorder: Secondary | ICD-10-CM

## 2020-08-18 NOTE — Telephone Encounter (Signed)
Requested medication (s) are due for refill today: yes  Requested medication (s) are on the active medication list: yes  Last refill: 08/04/20  #30  0 refills  Future visit scheduled: yes  Notes to clinic:  Not delegated    Requested Prescriptions  Pending Prescriptions Disp Refills   LORazepam (ATIVAN) 0.5 MG tablet [Pharmacy Med Name: LORAZEPAM 0.5 MG TABLET] 30 tablet 0    Sig: TAKE 1 TABLET BY MOUTH 2 TIMES DAILY AS NEEDED FOR ANXIETY.      Not Delegated - Psychiatry:  Anxiolytics/Hypnotics Failed - 08/18/2020  3:46 PM      Failed - This refill cannot be delegated      Passed - Urine Drug Screen completed in last 360 days      Passed - Valid encounter within last 6 months    Recent Outpatient Visits           2 weeks ago Generalized anxiety disorder   [facility], Gerlene Burdock, NP   6 months ago Infected wound   [facility], Gerlene Burdock, NP   11 months ago Suspected COVID-19 virus infection   [facility], Gerlene Burdock, NP   1 year ago Strain of right shoulder, initial encounter   [facility], Gerlene Burdock, NP   1 year ago Chronic use of benzodiazepine for therapeutic purpose   [facility], Asencion Partridge, MD       Future Appointments             In 3 weeks Janeece Agee, NP [facility] [place], [facility]

## 2020-08-19 NOTE — Telephone Encounter (Signed)
Patient is requesting a refill of the following medications: Requested Prescriptions   Pending Prescriptions Disp Refills   LORazepam (ATIVAN) 0.5 MG tablet [Pharmacy Med Name: LORAZEPAM 0.5 MG TABLET] 30 tablet 0    Sig: TAKE 1 TABLET BY MOUTH 2 TIMES DAILY AS NEEDED FOR ANXIETY.    Date of patient request: 08/18/20 Last office visit: 08/04/20 Date of last refill: 08/04/20 Last refill amount: 30 Follow up time period per chart:09/13/20

## 2020-09-13 ENCOUNTER — Ambulatory Visit: Payer: Medicaid Other | Admitting: Registered Nurse

## 2020-09-14 ENCOUNTER — Encounter: Payer: Self-pay | Admitting: Registered Nurse

## 2020-10-07 ENCOUNTER — Encounter: Payer: Self-pay | Admitting: Family Medicine

## 2020-10-07 ENCOUNTER — Telehealth (INDEPENDENT_AMBULATORY_CARE_PROVIDER_SITE_OTHER): Payer: Managed Care, Other (non HMO) | Admitting: Family Medicine

## 2020-10-07 DIAGNOSIS — L309 Dermatitis, unspecified: Secondary | ICD-10-CM | POA: Diagnosis not present

## 2020-10-07 DIAGNOSIS — R300 Dysuria: Secondary | ICD-10-CM

## 2020-10-07 MED ORDER — PREDNISONE 20 MG PO TABS: ORAL_TABLET | ORAL | 0 refills | Status: DC

## 2020-10-07 NOTE — Patient Instructions (Signed)
° ° ° °  If you have lab work done today you will be contacted with your lab results within the next 2 weeks.  If you have not heard from us then please contact us. The fastest way to get your results is to register for My Chart. ° ° °IF you received an x-ray today, you will receive an invoice from [facility]. Please contact [facility] at [phone] with questions or concerns regarding your invoice.  ° °IF you received labwork today, you will receive an invoice from [facility]. Please contact [facility] at [phone] with questions or concerns regarding your invoice.  ° °Our billing staff will not be able to assist you with questions regarding bills from these companies. ° °You will be contacted with the lab results as soon as they are available. The fastest way to get your results is to activate your My Chart account. Instructions are located on the last page of this paperwork. If you have not heard from us regarding the results in 2 weeks, please contact this office. °  ° ° ° °

## 2020-10-07 NOTE — Progress Notes (Signed)
Patient ID: Justin May, male    DOB: Mar 27, 1996  Age: 25 y.o. MRN: [id number]  Chief Complaint  Patient presents with  . Rash    1 week ago started with a rash on his stomach started as 1 small bump, then spread quickly to whole abdomen, back and head, some weakness in the evenings, does report rash is intensely itchy, unsure if feverish, had some chills and dizziness at beginning. Pt mother states he was vaccinated. Pt has not been exposed that he is aware of. Pt has not been vaccinated for COVID 19 but denies known exposure.     Subjective:   Telemedicine visit converted to a in person visit in the parking lot.  Patient called and for a video visit.  I was unable to get that established on several attempts.  Therefore I change it to a telephone visit.  Patient was in a secure place, understood the payment was needed for online visits, and that I was not able to examine him online.  He was at home in a secure place to talk on spoke to him from the back office.    Initial conversation was about 20 minutes connecting and talking.  As he described the rash he was uncertain whether these lesions were pustular or not.  They began itching quite a lot.  He had felt bad the first day or 2, but is not running any fever now.  He has has not had his COVID-vaccine. Over week ago the patient developed a bump on his stomach.  He tried to express something out of it unsuccessfully.  Over the coming days this past week he continued to get other spots on his body from his hair to his face to his abdomen and all over.  These are not draining pus though he is try to squeeze stuff out of them.   After discussing with him I concluded that we really need to be able to see the rash.  However with a little question as to whether it could be chickenpox I did not feel he should be coming in the building.  I asked him to drive over to the office and let me pick at the rash in the parking lot in the back.      Current  allergies, medications, problem list, past/family and social histories reviewed.  Objective:  There were no vitals taken for this visit.  He called me when he arrived in the back of the building and I went out and took a quick peek at the rash.  He has little plaque-like areas on his face and neck, and his hair, and on his trunk.  None are draining though a couple of people places and being picked that.  Did not see a major herald lesion.  However was not able to address them there.  After he showed me these lesions he started complaining of dysuria.  I told him he would need to make an appointment to come into more to be seen for that his dysuria.  Assessment & Plan:   Assessment: 1. Dermatitis   2. Dysuria       Plan: My initial reaction, because he described having places on his elbows and knees, was initially that this might be a guttate psoriasis.  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  He needs to come in to be seen for his dysuria.  He said he had not  been sexually involved, but I did not quiz is he will father about this while in the parking lot.  He kept referring to it as "another urinary tract infection."  I sent a second text message to his MyChart this morning, 10/08/2020.  No orders of the defined types were placed in this encounter.   Meds ordered this encounter  Medications  . predniSONE (DELTASONE) 20 MG tablet    Sig: Take 3 daily for 2 days, then 2 daily for 2 days, then 1 daily for 2 days for rash.  Take with food    Dispense:  6 tablet    Refill:  0         Patient Instructions       If you have lab work done today you will be contacted with your lab results within the next 2 weeks.  If you have not heard from [place] then please contact us. The fastest way to get your results is to register for My Chart.   IF you received an x-ray today, you will receive an invoice from [facility]. Please  contact [facility] at [phone] with questions or concerns regarding your invoice.   IF you received labwork today, you will receive an invoice from [facility]. Please contact [facility] at [phone] with questions or concerns regarding your invoice.   Our billing staff will not be able to assist you with questions regarding bills from these companies.  You will be contacted with the lab results as soon as they are available. The fastest way to get your results is to activate your My Chart account. Instructions are located on the last page of this paperwork. If you have not heard from [place] regarding the results in 2 weeks, please contact this office.        No follow-ups on file.   Janace Hoard, MD 10/08/2020

## 2020-10-08 ENCOUNTER — Encounter: Payer: Self-pay | Admitting: Family Medicine

## 2020-10-14 ENCOUNTER — Telehealth: Payer: Self-pay | Admitting: Family Medicine

## 2020-10-14 NOTE — Telephone Encounter (Addendum)
10/14/2020 - PATIENT HAD A VIRTUAL APPOINTMENT WITH DR. HOPPER ON 10/07/2020 FOR DERMATITIS. HE NEEDS TO GET A DOCTOR'S NOTE FOR HIS WORK STATING HE CAN GO BACK ON Sunday 10/17/2020. HE SAID THE DERMATITIS IS NOT COMPLETELY CLEARED UP YET. HE WOULD LIKE TO PICK IT UP ON Friday SINCE WE ARE CLOSED ON THE WEEKENDS. BEST PHONE WHEN READY IS: [phone] (CELL) [facility]BC

## 2020-10-14 NOTE — Telephone Encounter (Signed)
Ready in Reception pick up box for pt

## 2020-10-15 ENCOUNTER — Telehealth: Payer: Self-pay | Admitting: Registered Nurse

## 2020-10-15 NOTE — Telephone Encounter (Signed)
Pt came in to report that he had been taking predniSONE (DELTASONE) 20 MG tablet [[id number]  And was improved. He is finished with that now and for the past day or two it is flared up again,  Patient wants to know if he needs another round of this mediication    Please advise

## 2020-10-17 ENCOUNTER — Encounter: Payer: Self-pay | Admitting: Registered Nurse

## 2020-10-18 NOTE — Telephone Encounter (Signed)
Pt reports rash has appt 10/22/20

## 2020-10-18 NOTE — Telephone Encounter (Signed)
Pt finished his round of Prednisone, and was improving while taking it. Since finishing the medication the pt's condition has flared up again. Pt is wondering if he needs another round of prednisone. Please advise

## 2020-10-19 ENCOUNTER — Other Ambulatory Visit: Payer: Self-pay | Admitting: Registered Nurse

## 2020-10-19 DIAGNOSIS — L309 Dermatitis, unspecified: Secondary | ICD-10-CM

## 2020-10-19 MED ORDER — TRIAMCINOLONE ACETONIDE 0.1 % EX CREA: 1.0000 "application " | TOPICAL_CREAM | Freq: Two times a day (BID) | CUTANEOUS | 0 refills | Status: DC

## 2020-10-19 NOTE — Telephone Encounter (Signed)
I can refer him to derm. I will send topical over for once daily use as needed for itching relief.  Thank you  Rich

## 2020-10-22 ENCOUNTER — Ambulatory Visit: Payer: Managed Care, Other (non HMO) | Admitting: Registered Nurse

## 2021-01-13 NOTE — Telephone Encounter (Signed)
Opened in error

## 2021-04-06 IMAGING — CT CT HEAD W/O CM
4 series · 17 of 47 positions shown, 19 images · non-contrast
Comparison: 06/29/2017

CLINICAL DATA: Seizure

EXAM:
CT HEAD WITHOUT CONTRAST
TECHNIQUE: Contiguous axial images were obtained from the base of the skull
through the vertex without intravenous contrast.

[Series 3: head wo · axial · 0.43mm/px · z∈[-127,-7]mm · 7 of 34 slices shown, 9 images]
[im 5/34  brain]
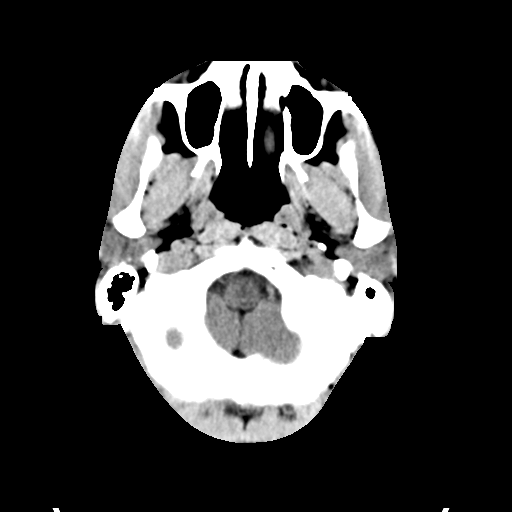
[im 5/34  bone]
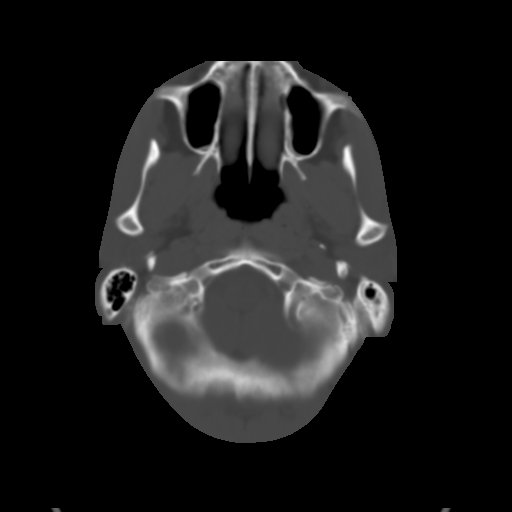
[im 9/34  brain]
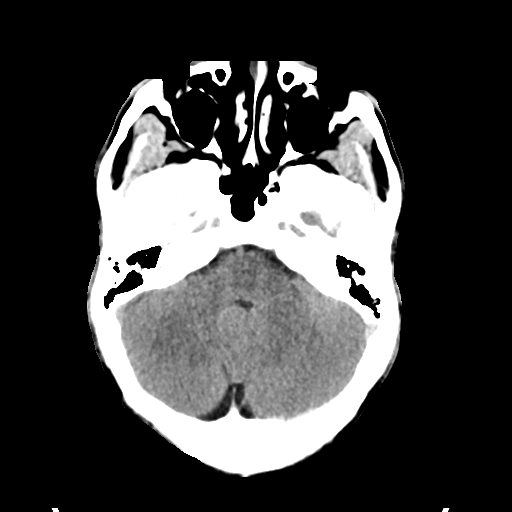
[im 13/34  brain]
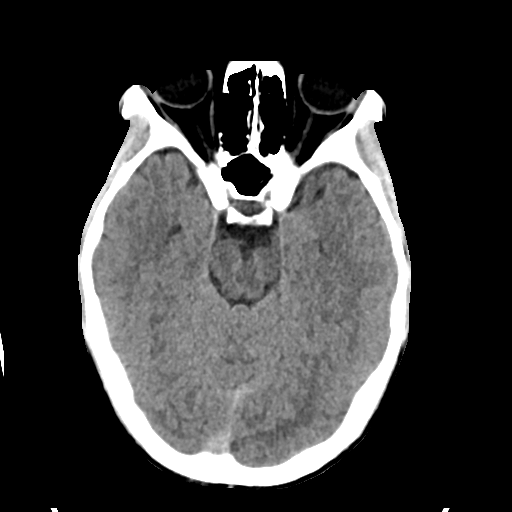
[im 17/34  brain]
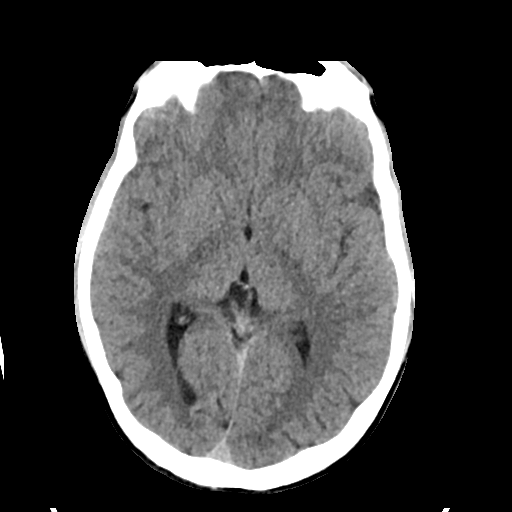
[im 21/34  brain]
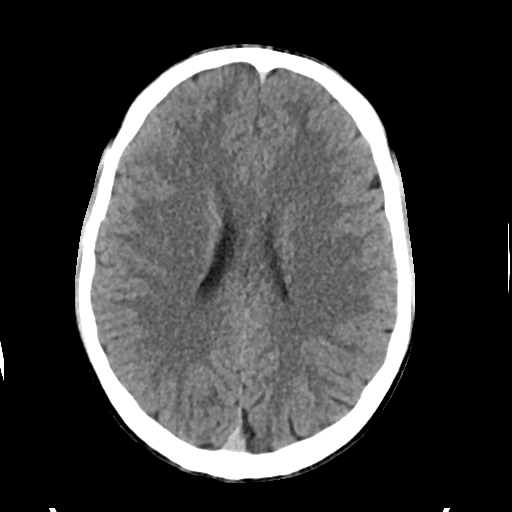
[im 21/34  bone]
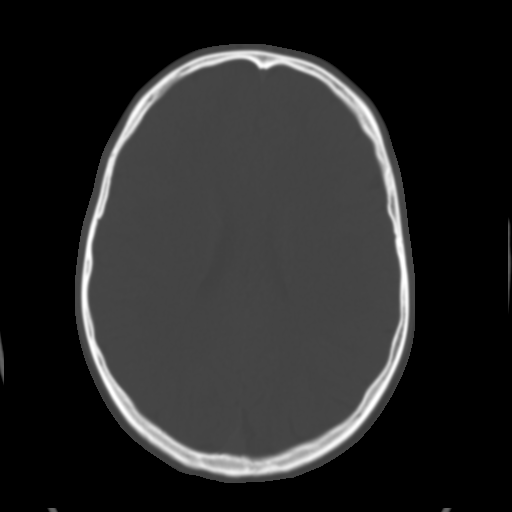
[im 25/34  brain]
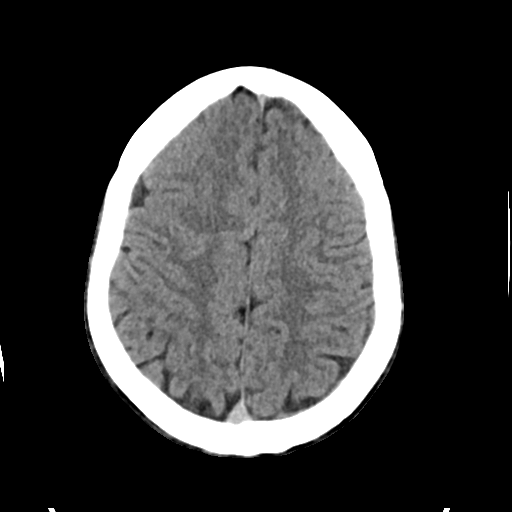
[im 29/34  brain]
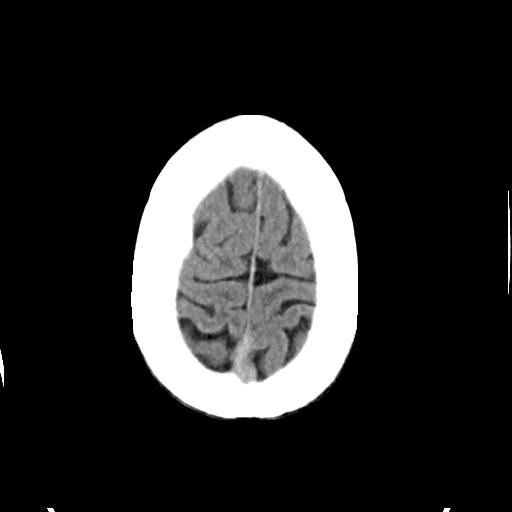

[Series 4: head bone · axial · 0.43mm/px · z∈[-131,-73]mm · 4 of 84 slices shown]
[im 9/84  bone]
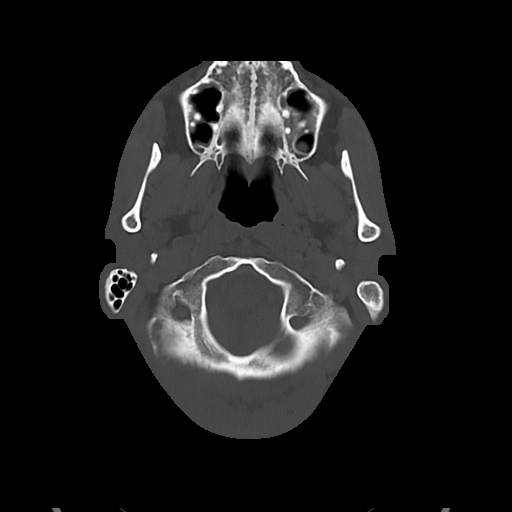
[im 17/84  bone]
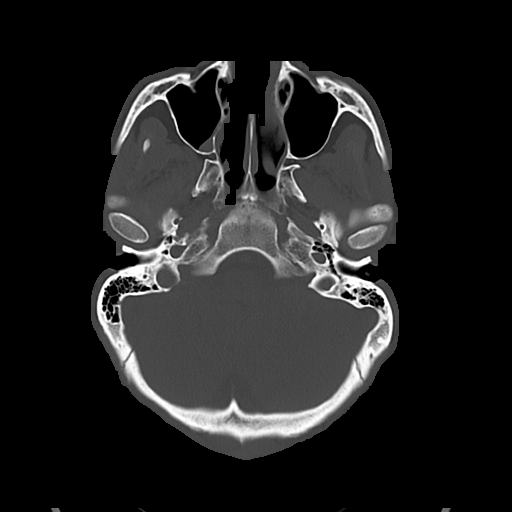
[im 25/84  bone]
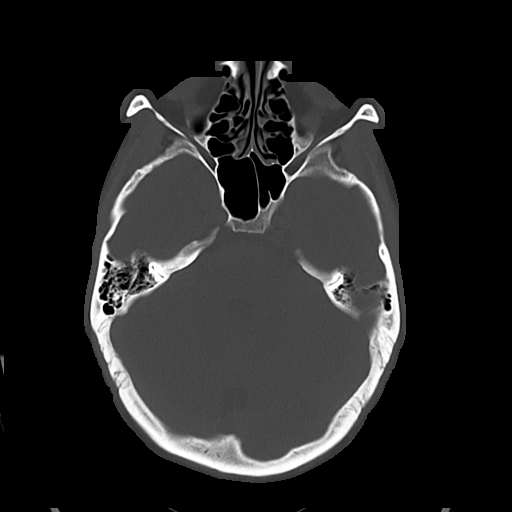
[im 38/84  bone]
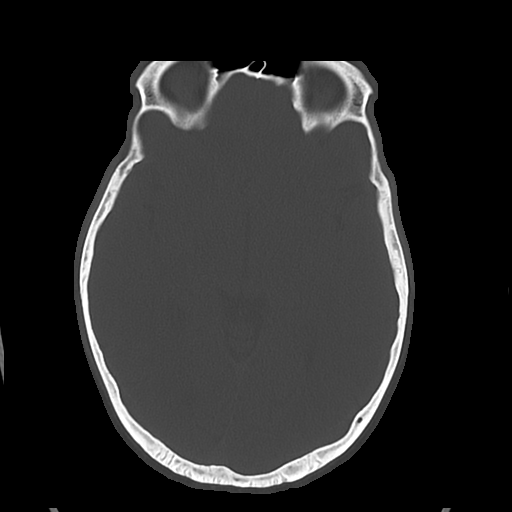

[Series 5: cor soft · coronal · 0.34mm/px · 3 of 73 slices shown]
[im 25/73  brain]
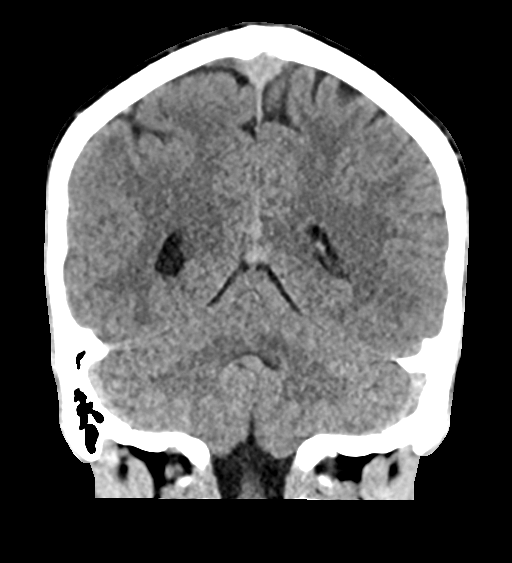
[im 33/73  brain]
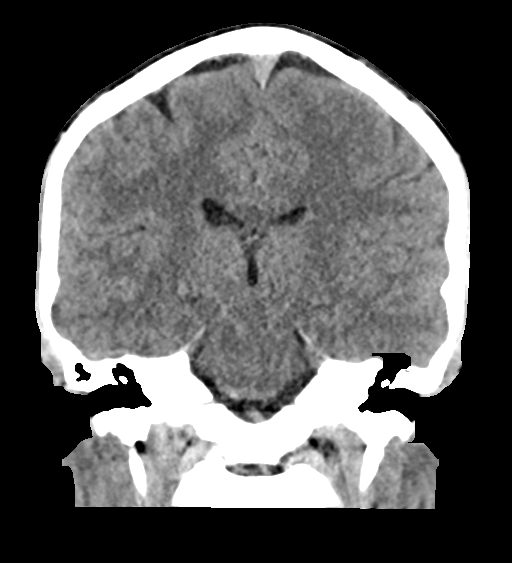
[im 41/73  brain]
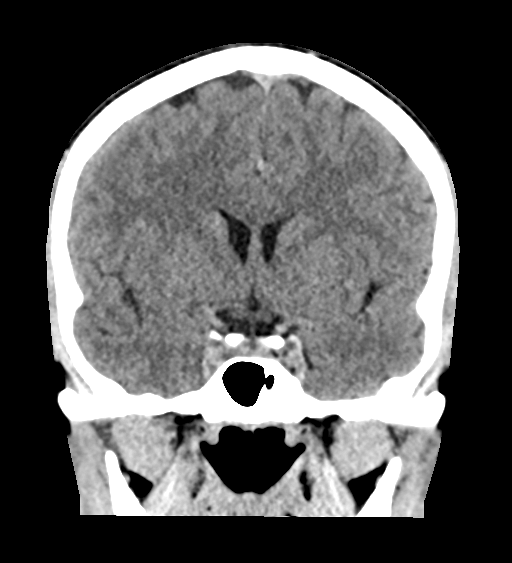

[Series 6: sag soft · sagittal · 0.37mm/px · 3 of 58 slices shown]
[im 20/58  brain]
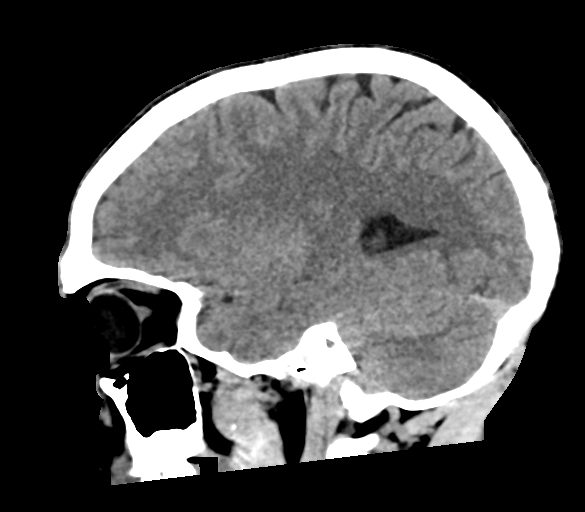
[im 29/58  brain]
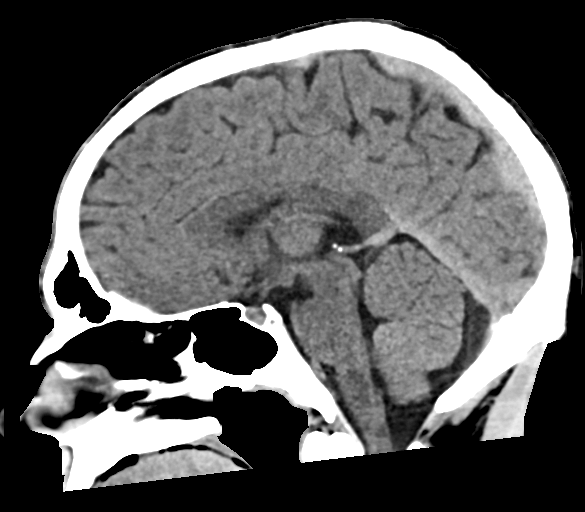
[im 39/58  brain]
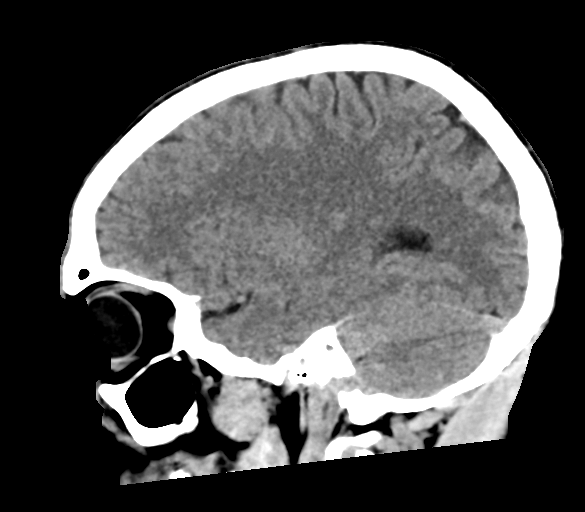

[17 of 47 positions shown; findings below may reference images not displayed]

FINDINGS: Brain: There is no mass, hemorrhage or extra-axial collection. The
size and configuration of the ventricles and extra-axial CSF spaces
are normal. The brain parenchyma is normal, without acute or chronic
infarction.

Vascular: No abnormal hyperdensity of the major intracranial
arteries or dural venous sinuses. No intracranial atherosclerosis.

Skull: The visualized skull base, calvarium and extracranial soft
tissues are normal.

Sinuses/Orbits: No fluid levels or advanced mucosal thickening of
the visualized paranasal sinuses. No mastoid or middle ear effusion.
The orbits are normal.
IMPRESSION: Normal head CT.

## 2022-01-08 ENCOUNTER — Emergency Department (HOSPITAL_COMMUNITY)
Admission: EM | Admit: 2022-01-08 | Discharge: 2022-01-08 | Disposition: A | Discharge status: Home / Self Care | Payer: Managed Care, Other (non HMO) | Attending: Emergency Medicine | Admitting: Emergency Medicine

## 2022-01-08 DIAGNOSIS — M79661 Pain in right lower leg: Secondary | ICD-10-CM | POA: Insufficient documentation

## 2022-01-08 DIAGNOSIS — S60931A Unspecified superficial injury of right thumb, initial encounter: Secondary | ICD-10-CM | POA: Diagnosis present

## 2022-01-08 DIAGNOSIS — X58XXXA Exposure to other specified factors, initial encounter: Secondary | ICD-10-CM | POA: Diagnosis not present

## 2022-01-08 DIAGNOSIS — M6282 Rhabdomyolysis: Secondary | ICD-10-CM | POA: Diagnosis not present

## 2022-01-08 DIAGNOSIS — D72829 Elevated white blood cell count, unspecified: Secondary | ICD-10-CM | POA: Insufficient documentation

## 2022-01-08 DIAGNOSIS — M79662 Pain in left lower leg: Secondary | ICD-10-CM | POA: Insufficient documentation

## 2022-01-08 DIAGNOSIS — S60011A Contusion of right thumb without damage to nail, initial encounter: Secondary | ICD-10-CM | POA: Diagnosis not present

## 2022-01-08 DIAGNOSIS — R202 Paresthesia of skin: Secondary | ICD-10-CM

## 2022-01-08 DIAGNOSIS — M79604 Pain in right leg: Secondary | ICD-10-CM

## 2022-01-08 LAB — CBC WITH DIFFERENTIAL/PLATELET
Abs Immature Granulocytes: 0.04 10*3/uL (ref 0.00–0.07)
Basophils Absolute: 0 10*3/uL (ref 0.0–0.1)
Basophils Relative: 0 %
Eosinophils Absolute: 0 10*3/uL (ref 0.0–0.5)
Eosinophils Relative: 0 %
HCT: 44.7 % (ref 39.0–52.0)
Hemoglobin: 15.1 g/dL (ref 13.0–17.0)
Immature Granulocytes: 0 %
Lymphocytes Relative: 15 %
Lymphs Abs: 1.7 10*3/uL (ref 0.7–4.0)
MCH: 30.5 pg (ref 26.0–34.0)
MCHC: 33.8 g/dL (ref 30.0–36.0)
MCV: 90.3 fL (ref 80.0–100.0)
Monocytes Absolute: 1.1 10*3/uL — ABNORMAL HIGH (ref 0.1–1.0)
Monocytes Relative: 10 %
Neutro Abs: 8.7 10*3/uL — ABNORMAL HIGH (ref 1.7–7.7)
Neutrophils Relative %: 75 %
Platelets: 197 10*3/uL (ref 150–400)
RBC: 4.95 MIL/uL (ref 4.22–5.81)
RDW: 12.2 % (ref 11.5–15.5)
WBC: 11.6 10*3/uL — ABNORMAL HIGH (ref 4.0–10.5)
nRBC: 0 % (ref 0.0–0.2)

## 2022-01-08 LAB — COMPREHENSIVE METABOLIC PANEL
ALT: 48 U/L — ABNORMAL HIGH (ref 0–44)
ALT: 47 U/L — ABNORMAL HIGH (ref 0–44)
AST: 161 U/L — ABNORMAL HIGH (ref 15–41)
AST: 142 U/L — ABNORMAL HIGH (ref 15–41)
Albumin: 4 g/dL (ref 3.5–5.0)
Albumin: 3.1 g/dL — ABNORMAL LOW (ref 3.5–5.0)
Alkaline Phosphatase: 56 U/L (ref 38–126)
Alkaline Phosphatase: 47 U/L (ref 38–126)
Anion gap: 10 (ref 5–15)
Anion gap: 7 (ref 5–15)
BUN: 12 mg/dL (ref 6–20)
BUN: 11 mg/dL (ref 6–20)
CO2: 26 mmol/L (ref 22–32)
CO2: 24 mmol/L (ref 22–32)
Calcium: 9 mg/dL (ref 8.9–10.3)
Calcium: 7.6 mg/dL — ABNORMAL LOW (ref 8.9–10.3)
Chloride: 103 mmol/L (ref 98–111)
Chloride: 106 mmol/L (ref 98–111)
Creatinine, Ser: 1.11 mg/dL (ref 0.61–1.24)
Creatinine, Ser: 1.08 mg/dL (ref 0.61–1.24)
GFR, Estimated: >60 mL/min (ref >60)
GFR, Estimated: >60 mL/min (ref >60)
Glucose, Bld: 98 mg/dL (ref 70–99)
Glucose, Bld: 112 mg/dL — ABNORMAL HIGH (ref 70–99)
Potassium: 3.9 mmol/L (ref 3.5–5.1)
Potassium: 3.5 mmol/L (ref 3.5–5.1)
Sodium: 139 mmol/L (ref 135–145)
Sodium: 137 mmol/L (ref 135–145)
Total Bilirubin: 1.1 mg/dL (ref 0.3–1.2)
Total Bilirubin: 0.8 mg/dL (ref 0.3–1.2)
Total Protein: 6.4 g/dL — ABNORMAL LOW (ref 6.5–8.1)
Total Protein: 5.1 g/dL — ABNORMAL LOW (ref 6.5–8.1)

## 2022-01-08 LAB — URINALYSIS, ROUTINE W REFLEX MICROSCOPIC
Bilirubin Urine: NEGATIVE
Glucose, UA: NEGATIVE mg/dL
Ketones, ur: 20 mg/dL — AB
Leukocytes,Ua: NEGATIVE
Nitrite: NEGATIVE
Protein, ur: 30 mg/dL — AB
Specific Gravity, Urine: 1.028 (ref 1.005–1.030)
pH: 5 (ref 5.0–8.0)

## 2022-01-08 LAB — CK
Total CK: 8125 U/L — ABNORMAL HIGH (ref 49–397)
Total CK: 6074 U/L — ABNORMAL HIGH (ref 49–397)

## 2022-01-08 MED ORDER — ACETAMINOPHEN 325 MG PO TABS
650.0000 mg | ORAL_TABLET | Freq: Once | ORAL | Status: AC
  Administered 2022-01-08: 650 mg via ORAL
  Filled 2022-01-08: qty 2

## 2022-01-08 MED ORDER — SODIUM CHLORIDE 0.9 % IV BOLUS: 1000.0000 mL | Freq: Once | INTRAVENOUS | Status: DC

## 2022-01-08 MED ORDER — SODIUM CHLORIDE 0.9 % IV SOLN: Freq: Once | INTRAVENOUS | Status: AC

## 2022-01-08 MED ORDER — IBUPROFEN 800 MG PO TABS: 800.0000 mg | ORAL_TABLET | Freq: Once | ORAL | Status: DC

## 2022-01-08 MED ORDER — SODIUM CHLORIDE 0.9 % IV BOLUS
3000.0000 mL | Freq: Once | INTRAVENOUS | Status: AC
  Administered 2022-01-08: 3000 mL via INTRAVENOUS

## 2022-01-08 MED ORDER — SODIUM CHLORIDE 0.9 % IV BOLUS
1000.0000 mL | Freq: Once | INTRAVENOUS | Status: DC
Start: 2022-01-08 — End: 2022-01-08

## 2022-01-08 NOTE — Discharge Instructions (Addendum)
No vigorous activities.  Recommend aggressive oral hydration with water and Gatorade at home.  Please hydrate yourself until your urine is clear as water.  If you are noticing that your urine is remaining dark and you are symptomatic still or worsening symptoms after 48 hours please return for evaluation.  I would not do any vigorous activities for at least 4 to 5 days or until you are completely symptom-free. ?

## 2022-01-08 NOTE — ED Provider Notes (Signed)
?[facility] ?Provider Note ? ? ?CSN: [id number] ?Arrival date & time: 01/08/22  1235 ? ?  ? ?History ? ?Chief Complaint  ?Patient presents with  ? Leg Pain  ? ? ?Justin May is a 26 y.o. male. ? ?Patient presents emergency department for evaluation of acute onset of bilateral lower extremity paresthesias.  Patient describes the symptoms as a "numbness" but states that he has sensation it is just dull compared to normal.  Patient states that the symptoms seem a little bit worse on the right side versus the left side.  He reports a stiff sensation and some tenderness in the right calf when he stands up.  No redness or warmth.  He denies injury but did run yesterday, approximately a mile, or for approximately 15-20 minutes.  He does report having some lower back pain yesterday that is resolved.  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  Patient denies warning symptoms of back pain including: fecal incontinence, urinary retention or overflow incontinence, night sweats, waking from sleep with back pain, unexplained fevers or weight loss, h/o cancer, IVDU, recent trauma.  Patient has noted a small bruised area to the tip of the right thumb and has had intermittent numbness in the thumb and index finger, but this has preceded current symptoms for some time.  No headache, vomiting, vision change.  No weakness in the arms of the legs.  Patient states that the change in sensation made him feel like he was "stumbling" while walking this morning. ? ? ? ?  ? ?Home Medications ?Prior to Admission medications   ?Medication Sig Start Date End Date Taking? Authorizing Provider  ?busPIRone (BUSPAR) 5 MG tablet Take 1 tablet (5 mg total) by mouth 3 (three) times daily. 08/04/20   Janeece AgeeMorrow, Richard, NP  ?clonazePAM (KLONOPIN) 1 MG tablet Take 1 mg by mouth 2 (two) times daily as needed. 09/28/20   [provider]  ?gabapentin (NEURONTIN) 600 MG tablet Take 600  mg by mouth 2 (two) times daily.    [provider]  ?predniSONE (DELTASONE) 20 MG tablet Take 3 daily for 2 days, then 2 daily for 2 days, then 1 daily for 2 days for rash.  Take with food 10/07/20   Peyton NajjarHopper, David H, MD  ?triamcinolone (KENALOG) 0.1 % Apply 1 application topically 2 (two) times daily. 10/19/20   Janeece AgeeMorrow, Richard, NP  ?   ? ?Allergies    ?Patient has no known allergies.   ? ?Review of Systems   ?Review of Systems ? ?Physical Exam ?Updated Vital Signs ?BP 123/76   Pulse 61   Temp 98.9 ?F (37.2 ?C) (Oral)   Resp 18   SpO2 96%  ?Physical Exam ?Vitals and nursing note reviewed.  ?Constitutional:   ?   Appearance: He is well-developed.  ?HENT:  ?   Head: Normocephalic and atraumatic.  ?Eyes:  ?   Conjunctiva/sclera: Conjunctivae normal.  ?Abdominal:  ?   Palpations: Abdomen is soft.  ?   Tenderness: There is no abdominal tenderness. There is no right CVA tenderness or left CVA tenderness.  ?Musculoskeletal:     ?   General: No tenderness. Normal range of motion.  ?   Cervical back: Normal range of motion.  ?   Comments: No step-off noted with palpation of spine.  No tenderness of the lower legs or with palpation of the calves.  ?Skin: ?   General: Skin is warm and  dry.  ?Neurological:  ?   Mental Status: He is alert.  ?   Sensory: Sensory deficit present.  ?   Motor: No abnormal muscle tone.  ?   Gait: Gait abnormal.  ?   Deep Tendon Reflexes:  ?   Reflex Scores: ?     Patellar reflexes are 1+ on the right side and 1+ on the left side. ?   Comments: 5/5 strength in entire upper and lower extremities bilaterally.  No true numbness as patient can feel me touching over the top and bottom of the foot, ankle, lower leg.  He reports decreased sensation in these areas.    ?Psychiatric:     ?   Mood and Affect: Mood normal.  ? ? ?ED Results / Procedures / Treatments   ?Labs ?(all labs ordered are listed, but only abnormal results are displayed) ?Labs Reviewed  ?CBC WITH DIFFERENTIAL/PLATELET - Abnormal;  Notable for the following components:  ?    Result Value  ? WBC 11.6 (*)   ? Neutro Abs 8.7 (*)   ? Monocytes Absolute 1.1 (*)   ? All other components within normal limits  ?COMPREHENSIVE METABOLIC PANEL - Abnormal; Notable for the following components:  ? Total Protein 6.4 (*)   ? AST 161 (*)   ? ALT 48 (*)   ? All other components within normal limits  ?CK - Abnormal; Notable for the following components:  ? Total CK 8,125 (*)   ? All other components within normal limits  ?URINALYSIS, ROUTINE W REFLEX MICROSCOPIC  ? ? ?EKG ?None ? ?Radiology ?No results found. ? ?Procedures ?Procedures  ? ? ?Medications Ordered in ED ?Medications - No data to display ? ?ED Course/ Medical Decision Making/ A&P ?  ? ?Patient seen and examined. History obtained directly from patient, mother also at bedside. ? ?Labs/EKG: Ordered CBC, CMP, CK. ? ?Imaging: None ordered.  Considered imaging of the head but no unilateral symptoms or symptoms that would be suggestive of stroke.  No severe headache. ? ?Medications/Fluids: None ordered. ? ?Most recent vital signs reviewed and are as follows: ?BP 123/76   Pulse 61   Temp 98.9 ?F (37.2 ?C) (Oral)   Resp 18   SpO2 96%  ? ?Initial impression: Lower extremity paresthesias, calf pain ? ?3:26 PM Reassessment performed. Patient appears stable. ? ?Labs personally reviewed and interpreted including: CBC with white blood cell count 11,600; CMP with transaminase elevation, mild, normal electrolytes; CK 8125. ? ?Reviewed pertinent lab work and imaging with patient at bedside. Questions answered.  ? ?Most current vital signs reviewed and are as follows: ?BP 123/76   Pulse 61   Temp 98.9 ?F (37.2 ?C) (Oral)   Resp 18   SpO2 96%  ? ?Plan: Discussed findings with patient and mother at bedside.  Dr. Lockie Mola seen patient.  Discussed admission to the hospital versus aggressive hydration here and recheck of labs.  Patient does not want to be admitted to the hospital.  3 L ordered after which point  will recheck CK and chemistry.  Patient in agreement. ? ?Signout to Dr. Lockie Mola at shift change. ? ? ?                        ?Medical Decision Making ?Amount and/or Complexity of Data Reviewed ?Labs: ordered. ? ? ?Patient with bilateral lower extremity sensation change and calf pain in setting of recent strenuous exercise yesterday.  CK is elevated suggesting that symptoms are related  to rhabdomyolysis.  Considered spinal cord etiology but no other red flags.  Patient does have some back pain but this is really chronic.  Low concern for Guillain-Barr? syndrome 5 myasthenia gravis, central cord etiology or cauda equina at this time.  Will defer imaging. ? ? ? ? ? ? ? ?Final Clinical Impression(s) / ED Diagnoses ?Final diagnoses:  ?Non-traumatic rhabdomyolysis  ?Pain in both lower extremities  ?Paresthesia of both lower extremities  ? ? ?Rx / DC Orders ?ED Discharge Orders   ? ? None  ? ?  ? ? ?  ?Renne Crigler, PA-C ?01/08/22 3329 ? ?  ?Pharaoh, Pio, DO ?01/08/22 1649 ? ?  ?Meghan, Tiemann, DO ?01/08/22 1952 ? ?

## 2022-01-08 NOTE — ED Triage Notes (Signed)
Pt here pov c/o bilateral leg pain and numbness from knee down started this morning. Sensation intact ,able to lift up both legs wo drifting. No swelling, redness or warmth noted. VSS ?

## 2022-01-19 ENCOUNTER — Ambulatory Visit (HOSPITAL_COMMUNITY): Admission: AD | Admit: 2022-01-19 | Payer: 59 | Source: Home / Self Care | Admitting: Psychiatry

## 2022-01-19 NOTE — Discharge Instructions (Signed)
Assessment/Therapy Walk-Ins will be available on Monday and Wednesday?s Only ? ?Monday/Wednesday: 8 AM until slots are full ? ?For Monday to Wednesday, it is recommended that patients arrive between 7:30 AM and 7:45 AM because patients will be seen in the order of arrival.  Go to the second floor on arrival and check in. ? ?**Availability is limited; therefore, patients may not be seen on the same day.** ? ?Psychiatry/Medication Management Walk-Ins will be available on Monday, Wednesday, Thursday and Friday ? ?Monday/Wednesday/Thursday/Friday: 8 AM to 11 AM. ? ?It is recommended that patients arrive by 7:30 AM to 7:45 AM because patients will be seen in the order of arrival.  Go to the second floor on arrival and check in. ? ?**Availability is limited; therefore, patients may not be seen on the same day.**- ?
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
## 2022-01-20 NOTE — H&P (Signed)
Behavioral Health Medical Screening Exam ? ?Justin May is a 26 y.o. male with a history of MDD, anxiety, alcohol use disorder, and benzodiazepine abuse who presents to [facility] voluntarily as a walk-in due to depression and anxiety. On evaluation patient is alert and oriented x 4, pleasant, and cooperative. Speech is clear and coherent. Mood is depressed and affect is congruent with mood. Thought process is coherent and thought content is logical. Denies audiovisual hallucinations. No indication that patient is responding to internal stimuli. No evidence of delusional thought content. Denies suicidal ideations. Denies homicidal ideations. Patient reports that he has not used benzodiazepines in 2 weeks. States he weaned himself off by tapering dose. Reports that until today he has not alcohol in approximately 3 months. States that he drank 2 Mad Dog 20/20 today and went to a bar and had a beer. States that he is living at the [facility] and does not feel safe there.  ? ?Total Time spent with patient: 20 minutes ? ?Psychiatric Specialty Exam: ? ?Presentation  ?General Appearance: Appropriate for Environment; Neat ? ?Eye Contact:Fair ? ?Speech:Clear and Coherent; Normal Rate ? ?Speech Volume:Normal ? ?Handedness:No data recorded ? ?Mood and Affect  ?Mood:Anxious; Depressed ? ?Affect:Congruent ? ? ?Thought Process  ?Thought Processes:Coherent; Goal Directed; Linear ? ?Descriptions of Associations:Intact ? ?Orientation:Full (Time, Place and Person) ? ?Thought Content:Logical ? ?History of Schizophrenia/Schizoaffective disorder:No ? ?Duration of Psychotic Symptoms:No data recorded ?Hallucinations:Hallucinations: None ? ?Ideas of Reference:None ? ?Suicidal Thoughts:Suicidal Thoughts: No ? ?Homicidal Thoughts:Homicidal Thoughts: No ? ? ?Sensorium  ?Memory:Immediate Good; Recent Good; Remote Good ? ?Judgment:Fair ? ?Insight:Fair ? ? ?Executive Functions  ?Concentration:Good ? ?Attention Span:Good ? ?Recall:Good ? ?Fund of  Knowledge:Good ? ?Language:Good ? ? ?Psychomotor Activity  ?Psychomotor Activity:Psychomotor Activity: Normal ? ? ?Assets  ?Assets:Communication Skills; Desire for Improvement; Financial Resources/Insurance; Physical Health; Social Support ? ? ?Sleep  ?Sleep:Sleep: Fair ? ? ? ?Physical Exam: ?Physical Exam ?Constitutional:   ?   General: He is not in acute distress. ?   Appearance: He is not ill-appearing, toxic-appearing or diaphoretic.  ?HENT:  ?   Right Ear: External ear normal.  ?   Left Ear: External ear normal.  ?Eyes:  ?   Pupils: Pupils are equal, round, and reactive to light.  ?Cardiovascular:  ?   Rate and Rhythm: Normal rate.  ?Pulmonary:  ?   Effort: Pulmonary effort is normal. No respiratory distress.  ?Musculoskeletal:     ?   General: Normal range of motion.  ?Neurological:  ?   Mental Status: He is alert and oriented to person, place, and time.  ?Psychiatric:     ?   Thought Content: Thought content is not delusional. Thought content does not include homicidal or suicidal ideation.  ? ?Review of Systems  ?Constitutional:  Negative for chills, diaphoresis, fever, malaise/fatigue and weight loss.  ?Cardiovascular:  Negative for chest pain and palpitations.  ?Gastrointestinal:  Negative for diarrhea, nausea and vomiting.  ?Neurological:  Negative for dizziness and seizures.  ?Psychiatric/Behavioral:  Positive for depression and substance abuse. Negative for hallucinations, memory loss and suicidal ideas. The patient is nervous/anxious and has insomnia.   ? ?Blood pressure 139/86, pulse 79, temperature 98.3 ?F (36.8 ?C), temperature source Oral, SpO2 99 %. There is no height or weight on file to calculate BMI. ? ?Musculoskeletal: ?Strength & Muscle Tone: within normal limits ?Gait & Station: normal ?Patient leans: N/A ? ? ?Recommendations: ? ?Based on my evaluation the patient does not appear to have an emergency  medical condition. ? ?Disposition: No evidence of imminent risk to self or others at  present.   ?Patient does not meet criteria for psychiatric inpatient admission. ?Supportive therapy provided about ongoing stressors. ?Discussed crisis plan, support from social network, calling 911, coming to the Emergency Department, and calling Suicide Hotline. ? ? ? ?Discharge Instructions   ? ?  ?Assessment/Therapy Walk-Ins will be available on Monday and Wednesday?s Only ? ?Monday/Wednesday: 8 AM until slots are full ? ?For Monday to Wednesday, it is recommended that patients arrive between 7:30 AM and 7:45 AM because patients will be seen in the order of arrival.  Go to the second floor on arrival and check in. ? ?**Availability is limited; therefore, patients may not be seen on the same day.** ? ?Psychiatry/Medication Management Walk-Ins will be available on Monday, Wednesday, Thursday and Friday ? ?Monday/Wednesday/Thursday/Friday: 8 AM to 11 AM. ? ?It is recommended that patients arrive by 7:30 AM to 7:45 AM because patients will be seen in the order of arrival.  Go to the second floor on arrival and check in. ? ?**Availability is limited; therefore, patients may not be seen on the same day.**- ? ? ?   ? ?Rozetta Nunnery, NP ?01/20/2022, 1:43 AM ? ?

## 2022-01-21 ENCOUNTER — Ambulatory Visit (HOSPITAL_COMMUNITY)
Admission: RE | Admit: 2022-01-21 | Discharge: 2022-01-21 | Disposition: A | Discharge status: Home / Self Care | Payer: 59 | Attending: Psychiatry | Admitting: Psychiatry

## 2022-01-21 NOTE — H&P (Signed)
Behavioral Health Medical Screening Exam ? ?Justin May is an 26 y.o. male with past psychiatric history of alcohol abuse, mild benzodiazepine use disorder, substance abuse binge pattern, MDD recurrent episode severe, ADHD, seizure disorder who presented to [facility] alone voluntarily for assessment of anxiety and recent homelessness. Patient presented to [facility] 01/19/22 with his mother where he was offered placement on [facility], patient declined admission at the time. States since last presentation he has had a "run in" with police and now homeless. He denies any substance abuse or use, suicidal or homicidal ideations, auditory or visual hallucinations at the time of this assessment. He contracts for safety and denies any intent or plan to harm himself or anyone else, no access to weapons. He verbalized an understanding that he does not meet inpatient criteria. Patient provided and discussed outpatient and shelter resources with provider given box lunch and states plan to go to grandmothers for next few nights and seek outpatient services Monday at [facility] during open access hours.  ? ?Total Time spent with patient: 15 minutes ? ?Psychiatric Specialty Exam: ?Physical Exam ?Vitals and nursing note reviewed.  ?Constitutional:   ?   Appearance: He is normal weight.  ?HENT:  ?   Head: Normocephalic.  ?   Nose: Nose normal.  ?   Mouth/Throat:  ?   Mouth: Mucous membranes are moist.  ?   Pharynx: Oropharynx is clear.  ?Eyes:  ?   Pupils: Pupils are equal, round, and reactive to light.  ?Cardiovascular:  ?   Rate and Rhythm: Normal rate.  ?   Pulses: Normal pulses.  ?Pulmonary:  ?   Effort: Pulmonary effort is normal.  ?Abdominal:  ?   General: Abdomen is flat.  ?Musculoskeletal:  ?   Cervical back: Normal range of motion.  ?Neurological:  ?   General: No focal deficit present.  ?   Mental Status: He is alert and oriented to person, place, and time. Mental status is at baseline.  ?Psychiatric:     ?   Attention and Perception:  Attention and perception normal.     ?   Mood and Affect: Mood and affect normal.     ?   Speech: Speech normal.     ?   Behavior: Behavior normal. Behavior is cooperative.     ?   Thought Content: Thought content is not paranoid or delusional. Thought content does not include homicidal or suicidal ideation. Thought content does not include homicidal or suicidal plan.     ?   Cognition and Memory: Cognition and memory normal.     ?   Judgment: Judgment is impulsive.  ? ?Review of Systems  ?Psychiatric/Behavioral:  Negative for hallucinations, self-injury, sleep disturbance and suicidal ideas.   ?All other systems reviewed and are negative. ?There were no vitals taken for this visit.There is no height or weight on file to calculate BMI. ?General Appearance: Casual ?Eye Contact:  Good ?Speech:  Clear and Coherent ?Volume:  Normal ?Mood:  Dysphoric ?Affect:  Blunt ?Thought Process:  Coherent ?Orientation:  Full (Time, Place, and Person) ?Thought Content:  Logical ?Suicidal Thoughts:  No ?Homicidal Thoughts:  No ?Memory:  Immediate;   Fair ?Recent;   Fair ?Judgement:  Fair ?Insight:  Good ?Psychomotor Activity:  Normal ?Concentration: Concentration: Fair and Attention Span: Fair ?Recall:  Fair ?Fund of Knowledge:Fair ?Language: Fair ?Akathisia:  NA ?Handed:  Right ?AIMS (if indicated):    ?Assets:  Communication Skills ?Desire for Improvement ?Physical Health ?Resilience ?Social Support ?Sleep:    ? ?  Musculoskeletal: ?Strength & Muscle Tone: within normal limits ?Gait & Station: normal ?Patient leans: N/A ? ?There were no vitals taken for this visit. ? ?Recommendations: ?Based on my evaluation the patient does not appear to have an emergency medical condition. ?Patient discharged with shelter and outpatient psychiatric resources for individual follow up.  ? ?Loletta Parish, NP ?01/21/2022, 7:19 PM ? ?

## 2022-07-29 ENCOUNTER — Emergency Department (HOSPITAL_COMMUNITY): Payer: Managed Care, Other (non HMO)

## 2022-07-29 ENCOUNTER — Other Ambulatory Visit: Payer: Self-pay

## 2022-07-29 ENCOUNTER — Emergency Department (HOSPITAL_COMMUNITY)
Admission: EM | Admit: 2022-07-29 | Discharge: 2022-07-29 | Discharge status: Against Medical Advice | Payer: Managed Care, Other (non HMO) | Attending: Emergency Medicine | Admitting: Emergency Medicine

## 2022-07-29 ENCOUNTER — Encounter (HOSPITAL_COMMUNITY): Payer: Self-pay | Admitting: Emergency Medicine

## 2022-07-29 DIAGNOSIS — M25562 Pain in left knee: Secondary | ICD-10-CM | POA: Insufficient documentation

## 2022-07-29 DIAGNOSIS — R6884 Jaw pain: Secondary | ICD-10-CM | POA: Insufficient documentation

## 2022-07-29 DIAGNOSIS — R569 Unspecified convulsions: Secondary | ICD-10-CM | POA: Diagnosis not present

## 2022-07-29 DIAGNOSIS — M542 Cervicalgia: Secondary | ICD-10-CM | POA: Diagnosis present

## 2022-07-29 DIAGNOSIS — Y92410 Unspecified street and highway as the place of occurrence of the external cause: Secondary | ICD-10-CM | POA: Insufficient documentation

## 2022-07-29 DIAGNOSIS — Z5321 Procedure and treatment not carried out due to patient leaving prior to being seen by health care provider: Secondary | ICD-10-CM | POA: Diagnosis not present

## 2022-07-29 DIAGNOSIS — M546 Pain in thoracic spine: Secondary | ICD-10-CM | POA: Insufficient documentation

## 2022-07-29 DIAGNOSIS — R079 Chest pain, unspecified: Secondary | ICD-10-CM | POA: Diagnosis not present

## 2022-07-29 LAB — URINALYSIS, ROUTINE W REFLEX MICROSCOPIC
Bacteria, UA: NONE SEEN
Bilirubin Urine: NEGATIVE
Glucose, UA: NEGATIVE mg/dL
Ketones, ur: NEGATIVE mg/dL
Nitrite: NEGATIVE
Protein, ur: NEGATIVE mg/dL
Specific Gravity, Urine: 1.021 (ref 1.005–1.030)
WBC, UA: >50 WBC/hpf — ABNORMAL HIGH (ref 0–5)
pH: 6 (ref 5.0–8.0)

## 2022-07-29 LAB — CBC WITH DIFFERENTIAL/PLATELET
Abs Immature Granulocytes: 0.05 10*3/uL (ref 0.00–0.07)
Basophils Absolute: 0 10*3/uL (ref 0.0–0.1)
Basophils Relative: 0 %
Eosinophils Absolute: 0.3 10*3/uL (ref 0.0–0.5)
Eosinophils Relative: 2 %
HCT: 45.8 % (ref 39.0–52.0)
Hemoglobin: 16.1 g/dL (ref 13.0–17.0)
Immature Granulocytes: 0 %
Lymphocytes Relative: 13 %
Lymphs Abs: 1.6 10*3/uL (ref 0.7–4.0)
MCH: 31.8 pg (ref 26.0–34.0)
MCHC: 35.2 g/dL (ref 30.0–36.0)
MCV: 90.3 fL (ref 80.0–100.0)
Monocytes Absolute: 1 10*3/uL (ref 0.1–1.0)
Monocytes Relative: 9 %
Neutro Abs: 8.8 10*3/uL — ABNORMAL HIGH (ref 1.7–7.7)
Neutrophils Relative %: 76 %
Platelets: 286 10*3/uL (ref 150–400)
RBC: 5.07 MIL/uL (ref 4.22–5.81)
RDW: 12.5 % (ref 11.5–15.5)
WBC: 11.7 10*3/uL — ABNORMAL HIGH (ref 4.0–10.5)
nRBC: 0 % (ref 0.0–0.2)

## 2022-07-29 LAB — RAPID URINE DRUG SCREEN, HOSP PERFORMED
Amphetamines: NOT DETECTED
Barbiturates: NOT DETECTED
Benzodiazepines: POSITIVE — AB
Cocaine: NOT DETECTED
Opiates: NOT DETECTED
Tetrahydrocannabinol: POSITIVE — AB

## 2022-07-29 LAB — COMPREHENSIVE METABOLIC PANEL
ALT: 17 U/L (ref 0–44)
AST: 26 U/L (ref 15–41)
Albumin: 3.7 g/dL (ref 3.5–5.0)
Alkaline Phosphatase: 74 U/L (ref 38–126)
Anion gap: 9 (ref 5–15)
BUN: 7 mg/dL (ref 6–20)
CO2: 27 mmol/L (ref 22–32)
Calcium: 8.9 mg/dL (ref 8.9–10.3)
Chloride: 106 mmol/L (ref 98–111)
Creatinine, Ser: 0.89 mg/dL (ref 0.61–1.24)
GFR, Estimated: >60 mL/min (ref >60)
Glucose, Bld: 89 mg/dL (ref 70–99)
Potassium: 4.1 mmol/L (ref 3.5–5.1)
Sodium: 142 mmol/L (ref 135–145)
Total Bilirubin: 0.2 mg/dL — ABNORMAL LOW (ref 0.3–1.2)
Total Protein: 6.3 g/dL — ABNORMAL LOW (ref 6.5–8.1)

## 2022-07-29 LAB — ETHANOL: Alcohol, Ethyl (B): 42 mg/dL — ABNORMAL HIGH (ref <10)

## 2022-07-29 MED ORDER — IOHEXOL 350 MG/ML SOLN
75.0000 mL | Freq: Once | INTRAVENOUS | Status: AC | PRN
  Administered 2022-07-29: 75 mL via INTRAVENOUS

## 2022-07-29 NOTE — ED Notes (Signed)
Patient transported to CT 

## 2022-07-29 NOTE — ED Notes (Addendum)
Pt stated that he was leaving he said "Ive already waited 5 hours and I don't want to wait anymore so in going to leave."
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
## 2023-02-05 ENCOUNTER — Emergency Department (HOSPITAL_BASED_OUTPATIENT_CLINIC_OR_DEPARTMENT_OTHER)
Admission: EM | Admit: 2023-02-05 | Discharge: 2023-02-05 | Disposition: A | Discharge status: Home / Self Care | Payer: BLUE CROSS/BLUE SHIELD | Attending: Emergency Medicine | Admitting: Emergency Medicine

## 2023-02-05 ENCOUNTER — Other Ambulatory Visit: Payer: Self-pay

## 2023-02-05 ENCOUNTER — Encounter (HOSPITAL_BASED_OUTPATIENT_CLINIC_OR_DEPARTMENT_OTHER): Payer: Self-pay

## 2023-02-05 DIAGNOSIS — R21 Rash and other nonspecific skin eruption: Secondary | ICD-10-CM | POA: Insufficient documentation

## 2023-02-05 LAB — CBC WITH DIFFERENTIAL/PLATELET
Abs Immature Granulocytes: 0.02 10*3/uL (ref 0.00–0.07)
Basophils Absolute: 0 10*3/uL (ref 0.0–0.1)
Basophils Relative: 1 %
Eosinophils Absolute: 0.2 10*3/uL (ref 0.0–0.5)
Eosinophils Relative: 2 %
HCT: 49.4 % (ref 39.0–52.0)
Hemoglobin: 17 g/dL (ref 13.0–17.0)
Immature Granulocytes: 0 %
Lymphocytes Relative: 29 %
Lymphs Abs: 2.4 10*3/uL (ref 0.7–4.0)
MCH: 30.6 pg (ref 26.0–34.0)
MCHC: 34.4 g/dL (ref 30.0–36.0)
MCV: 89 fL (ref 80.0–100.0)
Monocytes Absolute: 0.7 10*3/uL (ref 0.1–1.0)
Monocytes Relative: 8 %
Neutro Abs: 5 10*3/uL (ref 1.7–7.7)
Neutrophils Relative %: 60 %
Platelets: 277 10*3/uL (ref 150–400)
RBC: 5.55 MIL/uL (ref 4.22–5.81)
RDW: 13 % (ref 11.5–15.5)
WBC: 8.3 10*3/uL (ref 4.0–10.5)
nRBC: 0 % (ref 0.0–0.2)

## 2023-02-05 LAB — BASIC METABOLIC PANEL
Anion gap: 8 (ref 5–15)
BUN: 11 mg/dL (ref 6–20)
CO2: 29 mmol/L (ref 22–32)
Calcium: 8.9 mg/dL (ref 8.9–10.3)
Chloride: 102 mmol/L (ref 98–111)
Creatinine, Ser: 0.89 mg/dL (ref 0.61–1.24)
GFR, Estimated: >60 mL/min (ref >60)
Glucose, Bld: 93 mg/dL (ref 70–99)
Potassium: 3.6 mmol/L (ref 3.5–5.1)
Sodium: 139 mmol/L (ref 135–145)

## 2023-02-05 MED ORDER — DOXYCYCLINE HYCLATE 100 MG PO CAPS: 100.0000 mg | ORAL_CAPSULE | Freq: Two times a day (BID) | ORAL | 0 refills | Status: DC

## 2023-02-05 MED ORDER — DOXYCYCLINE HYCLATE 100 MG PO TABS
100.0000 mg | ORAL_TABLET | Freq: Once | ORAL | Status: AC
  Administered 2023-02-05: 100 mg via ORAL
  Filled 2023-02-05: qty 1

## 2023-02-05 NOTE — ED Triage Notes (Signed)
Pt c/o rash x3-4 days, more noticeable over 2-3 days. "First bump under L eye, then it spread to a couple bumps on my torso." Associated itching, dizziness/ weakness x2 days. Denies SHOB, fevers, additional symptoms. 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

## 2023-02-05 NOTE — Discharge Instructions (Signed)
Please follow-up with your primary care provider regarding recent symptoms and ER visit.  Today your labs are reassuring along with your physical exam.  You were given 1 dose of the antibiotic in the emergency department before your discharge and will be given a prescription for this.  Please take as prescribed and Please monitor your rash.  If you begin to have fevers, nausea/vomiting, pain with rash, or other worsening symptoms please return to ER.

## 2023-02-05 NOTE — ED Provider Notes (Addendum)
[place] EMERGENCY DEPARTMENT AT [facility] Provider Note   CSN: [id number] Arrival date & time: 02/05/23  1900     History  Chief Complaint  Patient presents with   Rash    Justin May is a 27 y.o. male history of anxiety, benzodiazepine use disorder, chest pain disorder, alcohol use disorder, substance abuse disorder, MDD, seizure disorder presented with 4 days of papule rash.  Patient states he had a cut on his finger from work on his right middle finger at the PIP joint on the dorsal aspects and was never treated for it.  Patient states since then he went to a house party and went into a hot tub and afterwards began noticing a rash that started near his cut and has spread to his torso left arm legs and back.  Patient has not noticed any rashes in his mouth.  Patient has tried hydrogen peroxide cream which has alleviated the itching however patient still notes the rash is there.  Patient states the rash is not painful and he does not have any fevers however he does note that he feels weak with this rash and unstable at certain times.  Patient denies hitting his head or LOC.  Patient denied fever/chills, chest pain, shortness of breath, new medications, nausea/vomiting, abdominal pain, dysuria, vision changes, neck pain, AMS, IVDU  Home Medications Prior to Admission medications   Medication Sig Start Date End Date Taking? Authorizing Provider  doxycycline (VIBRAMYCIN) 100 MG capsule Take 1 capsule (100 mg total) by mouth 2 (two) times daily. 02/05/23  Yes Raley Novicki, Beverly Gust, PA-C  busPIRone (BUSPAR) 5 MG tablet Take 1 tablet (5 mg total) by mouth 3 (three) times daily. 08/04/20   Janeece Agee, NP  clonazePAM (KLONOPIN) 1 MG tablet Take 1 mg by mouth 2 (two) times daily as needed. 09/28/20   [provider]  gabapentin (NEURONTIN) 600 MG tablet Take 600 mg by mouth 2 (two) times daily.    [provider]  predniSONE (DELTASONE) 20 MG tablet Take 3 daily for  2 days, then 2 daily for 2 days, then 1 daily for 2 days for rash.  Take with food 10/07/20   Peyton Najjar, MD  triamcinolone (KENALOG) 0.1 % Apply 1 application topically 2 (two) times daily. 10/19/20   Janeece Agee, NP      Allergies    Patient has no known allergies.    Review of Systems   Review of Systems  Skin:  Positive for rash.    Physical Exam Updated Vital Signs BP (!) 142/93   Pulse (!) 57   Temp 98.2 F (36.8 C)   Resp 16   SpO2 99%  Physical Exam Constitutional:      General: He is not in acute distress. HENT:     Mouth/Throat:     Comments: No mucosal involvement Cardiovascular:     Rate and Rhythm: Normal rate and regular rhythm.     Pulses: Normal pulses.     Heart sounds: Normal heart sounds. No murmur heard. Musculoskeletal:        General: Normal range of motion.     Cervical back: Normal range of motion. No rigidity.  Skin:    Capillary Refill: Capillary refill takes less than 2 seconds.     Comments: Papular rash noted on upper extremities, lower extremities, torso, back, neck Not petechial in nature No mucosal involvement No track marks noted Healing wound noted to right third digit dorsal aspect of PIP  joint Rash nontender to palpation  Neurological:     Mental Status: He is alert.     Comments: Sensation intact in all 4 limbs     ED Results / Procedures / Treatments   Labs (all labs ordered are listed, but only abnormal results are displayed) Labs Reviewed  BASIC METABOLIC PANEL  CBC WITH DIFFERENTIAL/PLATELET    EKG EKG Interpretation  Date/Time:  Monday Feb 05 2023 19:10:55 EDT Ventricular Rate:  50 PR Interval:  129 QRS Duration: 112 QT Interval:  434 QTC Calculation: 396 R Axis:   93 Text Interpretation: Sinus rhythm Borderline intraventricular conduction delay Since last tracing rate faster Confirmed by Linwood Dibbles [id number]) on 02/05/2023 7:26:33 PM  Radiology No results found.  Procedures Procedures    Medications  Ordered in ED Medications  doxycycline (VIBRA-TABS) tablet 100 mg (has no administration in time range)    ED Course/ Medical Decision Making/ A&P                             Medical Decision Making Amount and/or Complexity of Data Reviewed Labs: ordered.  Risk Prescription drug management.   Gaylyn Lambert 27 y.o. presented today for rash. Working DDx that I considered at this time includes, but not limited to, ITP, HUS, coagulopathy, HSP, vasculitis, infectious disease.  R/o DDx: ITP, HUS, coagulopathy, HSP, vasculitis: These are considered less likely due to history of present illness and physical exam findings  Review of prior external notes: 01/08/2022 ED  Unique Tests and My Interpretation:  CBC: Unremarkable BMP: Unremarkable  Discussion with Independent Historian:  Mom  Discussion of Management of Tests: None  Risk: Medium: prescription drug management  Risk Stratification Score: None  Staffed with Lynelle Doctor, MD  Plan: Patient presented for papular rash in no acute distress and stable vitals.  Patient did have papular rash noted on the upper and lower extremities on the torso and back extending up into the neck but did not have any mucosal involvement.  Rash is nontender to palpation.  Patient denied fevers with this rash.  Patient states that he was in a hot tub and have high suspicion that patient's wound not fully healed and in the hot tub patient contracted a Pseudomonas infection causing the petechial rash.  Pending patient's labs patient may be discharged on doxycycline to cover for any MRSA or Pseudomonas as patient has history of MRSA infection.  Patient's labs are reassuring.  At this time I suspect patient most likely has an infection from getting into the hot tub with a dirty wound and will cover with doxycycline.  Patient will get 1 dose in the ER before discharge and given a prescription.  I encouraged the patient to follow-up with his primary care provider  regarding recent symptoms and to monitor the rash.  If patient is to have pain with rash, fevers, nausea/vomiting or other worsening symptoms he is to return to ER.  Patient was given return precautions. Patient stable for discharge at this time.  Patient verbalized understanding of plan.         Final Clinical Impression(s) / ED Diagnoses Final diagnoses:  Rash    Rx / DC Orders ED Discharge Orders          Ordered    doxycycline (VIBRAMYCIN) 100 MG capsule  2 times daily        02/05/23 2121  Netta Corrigan, PA-C 02/05/23 2124    Netta Corrigan, PA-C 02/05/23 2328    Linwood Dibbles, MD 02/05/23 [phone]

## 2023-02-10 ENCOUNTER — Other Ambulatory Visit: Payer: Self-pay

## 2023-02-10 ENCOUNTER — Emergency Department (HOSPITAL_BASED_OUTPATIENT_CLINIC_OR_DEPARTMENT_OTHER)
Admission: EM | Admit: 2023-02-10 | Discharge: 2023-02-10 | Disposition: A | Discharge status: Home / Self Care | Payer: BLUE CROSS/BLUE SHIELD | Attending: Emergency Medicine | Admitting: Emergency Medicine

## 2023-02-10 ENCOUNTER — Encounter (HOSPITAL_BASED_OUTPATIENT_CLINIC_OR_DEPARTMENT_OTHER): Payer: Self-pay | Admitting: Emergency Medicine

## 2023-02-10 DIAGNOSIS — Z7952 Long term (current) use of systemic steroids: Secondary | ICD-10-CM | POA: Insufficient documentation

## 2023-02-10 DIAGNOSIS — J45909 Unspecified asthma, uncomplicated: Secondary | ICD-10-CM | POA: Diagnosis not present

## 2023-02-10 DIAGNOSIS — R21 Rash and other nonspecific skin eruption: Secondary | ICD-10-CM | POA: Diagnosis not present

## 2023-02-10 LAB — CBC WITH DIFFERENTIAL/PLATELET
Abs Immature Granulocytes: 0.02 10*3/uL (ref 0.00–0.07)
Basophils Absolute: 0.1 10*3/uL (ref 0.0–0.1)
Basophils Relative: 1 %
Eosinophils Absolute: 0.2 10*3/uL (ref 0.0–0.5)
Eosinophils Relative: 3 %
HCT: 47.9 % (ref 39.0–52.0)
Hemoglobin: 16.6 g/dL (ref 13.0–17.0)
Immature Granulocytes: 0 %
Lymphocytes Relative: 23 %
Lymphs Abs: 1.9 10*3/uL (ref 0.7–4.0)
MCH: 30.5 pg (ref 26.0–34.0)
MCHC: 34.7 g/dL (ref 30.0–36.0)
MCV: 87.9 fL (ref 80.0–100.0)
Monocytes Absolute: 0.7 10*3/uL (ref 0.1–1.0)
Monocytes Relative: 9 %
Neutro Abs: 5.3 10*3/uL (ref 1.7–7.7)
Neutrophils Relative %: 64 %
Platelets: 228 10*3/uL (ref 150–400)
RBC: 5.45 MIL/uL (ref 4.22–5.81)
RDW: 13.1 % (ref 11.5–15.5)
WBC: 8.2 10*3/uL (ref 4.0–10.5)
nRBC: 0 % (ref 0.0–0.2)

## 2023-02-10 LAB — COMPREHENSIVE METABOLIC PANEL
ALT: 8 U/L (ref 0–44)
AST: 16 U/L (ref 15–41)
Albumin: 4.2 g/dL (ref 3.5–5.0)
Alkaline Phosphatase: 76 U/L (ref 38–126)
Anion gap: 10 (ref 5–15)
BUN: 11 mg/dL (ref 6–20)
CO2: 26 mmol/L (ref 22–32)
Calcium: 8.9 mg/dL (ref 8.9–10.3)
Chloride: 104 mmol/L (ref 98–111)
Creatinine, Ser: 0.73 mg/dL (ref 0.61–1.24)
GFR, Estimated: >60 mL/min (ref >60)
Glucose, Bld: 118 mg/dL — ABNORMAL HIGH (ref 70–99)
Potassium: 3.6 mmol/L (ref 3.5–5.1)
Sodium: 140 mmol/L (ref 135–145)
Total Bilirubin: 0.9 mg/dL (ref 0.3–1.2)
Total Protein: 6.8 g/dL (ref 6.5–8.1)

## 2023-02-10 LAB — HIV ANTIBODY (ROUTINE TESTING W REFLEX): HIV Screen 4th Generation wRfx: NONREACTIVE

## 2023-02-10 MED ORDER — METHYLPREDNISOLONE 4 MG PO TBPK: ORAL_TABLET | ORAL | 0 refills | Status: DC

## 2023-02-10 MED ORDER — PENICILLIN G BENZATHINE 1200000 UNIT/2ML IM SUSY
2.4000 10*6.[IU] | PREFILLED_SYRINGE | Freq: Once | INTRAMUSCULAR | Status: AC
  Administered 2023-02-10: 2.4 10*6.[IU] via INTRAMUSCULAR
  Filled 2023-02-10: qty 4

## 2023-02-10 NOTE — ED Triage Notes (Signed)
Pt returns with severe red/raised rash covering his whole body.

## 2023-02-10 NOTE — ED Provider Notes (Signed)
[place] EMERGENCY DEPARTMENT AT [facility] Provider Note   CSN: [id number] Arrival date & time: 02/10/23  1412     History  No chief complaint on file.   Justin May is a 27 y.o. male.  HPI Patient presents with rash.  Has had for around a week now.  Had been seen 5 days ago for rash on finger and head.  Had been given doxycycline.  Had been in a hot tub so treated for presumed hot tub folliculitis.  However now worsening rash.  Now over his whole body.  Not at this mouth.  However is on face more on upper extremities just compared to legs.  No fevers.  It is itchy.  Some scaling.  Has had unprotected sex.  No penile discharge but has had lesions on his penis.   Past Medical History:  Diagnosis Date   ADHD    Anxiety    Asthma    MDD (major depressive disorder), recurrent episode, severe (HCC) 05/13/2020   Seizure disorder (HCC) 07/24/2019   Seizures (HCC)     Home Medications Prior to Admission medications   Medication Sig Start Date End Date Taking? Authorizing Provider  methylPREDNISolone (MEDROL DOSEPAK) 4 MG TBPK tablet Use per package directions. 02/10/23  Yes Benjiman Core, MD  busPIRone (BUSPAR) 5 MG tablet Take 1 tablet (5 mg total) by mouth 3 (three) times daily. 08/04/20   Janeece Agee, NP  clonazePAM (KLONOPIN) 1 MG tablet Take 1 mg by mouth 2 (two) times daily as needed. 09/28/20   [provider]  doxycycline (VIBRAMYCIN) 100 MG capsule Take 1 capsule (100 mg total) by mouth 2 (two) times daily. 02/05/23   Netta Corrigan, PA-C  gabapentin (NEURONTIN) 600 MG tablet Take 600 mg by mouth 2 (two) times daily.    [provider]  predniSONE (DELTASONE) 20 MG tablet Take 3 daily for 2 days, then 2 daily for 2 days, then 1 daily for 2 days for rash.  Take with food 10/07/20   Peyton Najjar, MD  triamcinolone (KENALOG) 0.1 % Apply 1 application topically 2 (two) times daily. 10/19/20   Janeece Agee, NP      Allergies    Patient  has no known allergies.    Review of Systems   Review of Systems  Physical Exam Updated Vital Signs BP 125/78   Pulse 65   Temp 99.1 F (37.3 C) (Oral)   Resp 17   SpO2 100%  Physical Exam Vitals and nursing note reviewed.  HENT:     Head: Atraumatic.  Skin:    Comments: Diffuse papular rash.  Is on face and somewhat confluent.  Some scaling.  Does go down trunk and to a lesser degree lower extremities.  Does involve groin.  There is lesions on his penis.  They do blanch.            ED Results / Procedures / Treatments   Labs (all labs ordered are listed, but only abnormal results are displayed) Labs Reviewed  COMPREHENSIVE METABOLIC PANEL - Abnormal; Notable for the following components:      Result Value   Glucose, Bld 118 (*)    All other components within normal limits  CBC WITH DIFFERENTIAL/PLATELET  HIV ANTIBODY (ROUTINE TESTING W REFLEX)  RPR  SPOTTED FEVER GROUP ANTIBODIES    EKG None  Radiology No results found.  Procedures Procedures    Medications Ordered in ED Medications  penicillin g benzathine (BICILLIN LA) 1200000 UNIT/2ML  injection 2.4 Million Units (2.4 Million Units Intramuscular Given 02/10/23 1848)    ED Course/ Medical Decision Making/ A&P                             Medical Decision Making Amount and/or Complexity of Data Reviewed Labs: ordered.  Risk Prescription drug management.   Patient with a rash.  Worsening over the last week.  Began more on head but now spreading down body.  Differential diagnoses long.  With rashes involving palms and feet does include syphilis and [facility] spotted fever.  No known tick exposure.  Has had unprotected sex however.  Are lesions on the penis.  Will check RPR and RMSF testing.  Also will get HIV testing.  Blood work reassuring.  With rash including penis does put higher risk for syphilis.  RPR pending.  However does have a history of dermatitis and does have more scaling rash on  elbows somewhat like psoriasis.  Discussed with Dr. [name] from infectious disease.  Patient does not have a PCP.  Treated with penicillin here to cover secondary syphilis although RPR is pending.  Will be followed by her for further results but also treated with steroids for other causes of rash.  Appears stable for discharge home.        Final Clinical Impression(s) / ED Diagnoses Final diagnoses:  Rash    Rx / DC Orders ED Discharge Orders          Ordered    methylPREDNISolone (MEDROL DOSEPAK) 4 MG TBPK tablet        02/10/23 1926              Benjiman Core, MD 02/10/23 1941

## 2023-02-10 NOTE — Discharge Instructions (Signed)
Your rash could be from many different sources.  1 however is syphilis.  You have been treated.  We will also give some steroids.  I talked with the infectious disease doctor who will help follow with the results.

## 2023-02-10 NOTE — ED Notes (Signed)
RN reviewed discharge instructions with pt. Pt verbalized understanding and had no further questions. VSS upon discharge.  

## 2023-02-10 NOTE — ED Notes (Signed)
Pt given 4 oz of cranberry juice and 4 oz of apple juice with peanut butter crackers and popcorn.

## 2023-02-11 LAB — RPR: RPR Ser Ql: NONREACTIVE

## 2023-02-14 LAB — SPOTTED FEVER GROUP ANTIBODIES
Spotted Fever Group IgG: <1:64 {titer}
Spotted Fever Group IgM: <1:64 {titer}

## 2023-02-17 ENCOUNTER — Encounter (HOSPITAL_BASED_OUTPATIENT_CLINIC_OR_DEPARTMENT_OTHER): Payer: Self-pay

## 2023-02-17 ENCOUNTER — Other Ambulatory Visit: Payer: Self-pay

## 2023-02-17 ENCOUNTER — Emergency Department (HOSPITAL_BASED_OUTPATIENT_CLINIC_OR_DEPARTMENT_OTHER)
Admission: EM | Admit: 2023-02-17 | Discharge: 2023-02-17 | Disposition: A | Discharge status: Home / Self Care | Payer: BLUE CROSS/BLUE SHIELD | Attending: Emergency Medicine | Admitting: Emergency Medicine

## 2023-02-17 DIAGNOSIS — R21 Rash and other nonspecific skin eruption: Secondary | ICD-10-CM | POA: Diagnosis present

## 2023-02-17 MED ORDER — ONDANSETRON 4 MG PO TBDP
4.0000 mg | ORAL_TABLET | Freq: Once | ORAL | Status: AC
  Administered 2023-02-17: 4 mg via ORAL
  Filled 2023-02-17: qty 1

## 2023-02-17 MED ORDER — PREDNISONE 20 MG PO TABS: 20.0000 mg | ORAL_TABLET | Freq: Every day | ORAL | 0 refills | Status: AC

## 2023-02-17 MED ORDER — ONDANSETRON 4 MG PO TBDP: 4.0000 mg | ORAL_TABLET | Freq: Three times a day (TID) | ORAL | 0 refills | Status: DC | PRN

## 2023-02-17 MED ORDER — ACETAMINOPHEN 325 MG PO TABS
650.0000 mg | ORAL_TABLET | Freq: Once | ORAL | Status: AC
  Administered 2023-02-17: 650 mg via ORAL
  Filled 2023-02-17: qty 2

## 2023-02-17 NOTE — ED Notes (Signed)
Pt refused further testing/blood work at this time. Provider aware.

## 2023-02-17 NOTE — ED Provider Notes (Signed)
[place] EMERGENCY DEPARTMENT AT [facility] Provider Note   CSN: [id number] Arrival date & time: 02/17/23  1241     History  Chief Complaint  Patient presents with   Rash    Justin May is a 27 y.o. male with a past medical history significant for anxiety, polysubstance abuse, and seizure disorder who presents to the ED due to persistent papular rash associated with pruritus that started after he cut his finger at work and then went in a hot tub the beginning of this month.  Patient has been seen twice in the ED for the same complaint.  Initially on 5/6 he was treated with doxycycline to cover for suspected hot tub folliculitis.  Patient was then seen on 5/11 where he was treated for suspected syphilis and discharged with a Medrol Dosepak.  Patient states his rash has somewhat improved initially after penicillin however, now began to feel generalized weakness and persistent pruritus.  Patient returns to the ED after searching online requesting another penicillin shot for suspected syphilis.  RPR on 5/11 was negative.  Spotted fever group antibodies also negative.  Denies recent fever.  No recent tick bites.  Denies any new medications or products.  Rash is located on palms and soles.  History obtained from patient and past medical records. No interpreter used during encounter.       Home Medications Prior to Admission medications   Medication Sig Start Date End Date Taking? Authorizing Provider  ondansetron (ZOFRAN-ODT) 4 MG disintegrating tablet Take 1 tablet (4 mg total) by mouth every 8 (eight) hours as needed for nausea or vomiting. 02/17/23  Yes Dontavious Emily, Merla Riches, PA-C  predniSONE (DELTASONE) 20 MG tablet Take 1 tablet (20 mg total) by mouth daily for 5 days. 02/17/23 02/22/23 Yes Lurdes Haltiwanger, Merla Riches, PA-C  busPIRone (BUSPAR) 5 MG tablet Take 1 tablet (5 mg total) by mouth 3 (three) times daily. 08/04/20   Janeece Agee, NP  clonazePAM (KLONOPIN) 1 MG tablet Take 1  mg by mouth 2 (two) times daily as needed. 09/28/20   [provider]  doxycycline (VIBRAMYCIN) 100 MG capsule Take 1 capsule (100 mg total) by mouth 2 (two) times daily. 02/05/23   Netta Corrigan, PA-C  gabapentin (NEURONTIN) 600 MG tablet Take 600 mg by mouth 2 (two) times daily.    [provider]  methylPREDNISolone (MEDROL DOSEPAK) 4 MG TBPK tablet Use per package directions. 02/10/23   Benjiman Core, MD  predniSONE (DELTASONE) 20 MG tablet Take 3 daily for 2 days, then 2 daily for 2 days, then 1 daily for 2 days for rash.  Take with food 10/07/20   Peyton Najjar, MD  triamcinolone (KENALOG) 0.1 % Apply 1 application topically 2 (two) times daily. 10/19/20   Janeece Agee, NP      Allergies    Patient has no known allergies.    Review of Systems   Review of Systems  Constitutional:  Negative for chills and fever.  Skin:  Positive for color change and rash.    Physical Exam Updated Vital Signs BP (!) 138/108 (BP Location: Right Arm)   Pulse 91   Temp 98.2 F (36.8 C) (Oral)   Resp 18   Ht 5\' 11"  (1.803 m)   Wt 68 kg   SpO2 100%   BMI 20.92 kg/m  Physical Exam Vitals and nursing note reviewed.  Constitutional:      General: He is not in acute distress.    Appearance: He is  not ill-appearing.  HENT:     Head: Normocephalic.  Eyes:     Pupils: Pupils are equal, round, and reactive to light.  Cardiovascular:     Rate and Rhythm: Normal rate and regular rhythm.     Pulses: Normal pulses.     Heart sounds: Normal heart sounds. No murmur heard.    No friction rub. No gallop.  Pulmonary:     Effort: Pulmonary effort is normal.     Breath sounds: Normal breath sounds.  Abdominal:     General: Abdomen is flat. There is no distension.     Palpations: Abdomen is soft.     Tenderness: There is no abdominal tenderness. There is no guarding or rebound.  Musculoskeletal:        General: Normal range of motion.     Cervical back: Neck supple.  Skin:     General: Skin is warm and dry.     Comments: Diffuse papular rash that is blanchable.  Covers entire body.  Some scaling.  No drainage.  No petechiae or purpura. No blisters.  Neurological:     General: No focal deficit present.     Mental Status: He is alert.  Psychiatric:        Mood and Affect: Mood normal.        Behavior: Behavior normal.          ED Results / Procedures / Treatments   Labs (all labs ordered are listed, but only abnormal results are displayed) Labs Reviewed  RESPIRATORY PANEL BY PCR  CMV IGM  CMV ANTIBODY, IGG (EIA)  MONONUCLEOSIS SCREEN  EPSTEIN-BARR VIRUS (EBV) ANTIBODY PROFILE  HIV-1 RNA QUANT-NO REFLEX-BLD  HEPATITIS PANEL, ACUTE    EKG None  Radiology No results found.  Procedures Procedures    Medications Ordered in ED Medications  ondansetron (ZOFRAN-ODT) disintegrating tablet 4 mg (4 mg Oral Given 02/17/23 1526)  acetaminophen (TYLENOL) tablet 650 mg (650 mg Oral Given 02/17/23 1525)    ED Course/ Medical Decision Making/ A&P                             Medical Decision Making Amount and/or Complexity of Data Reviewed External Data Reviewed:     Details: Reviewed previous images from 5/11 Labs: ordered.  Risk OTC drugs. Prescription drug management.   This patient presents to the ED for concern of rash, this involves an extensive number of treatment options, and is a complaint that carries with it a high risk of complications and morbidity.  The differential diagnosis includes syphilis, tickborne illness, allergic reaction, psoriasis, etc  27 year old male presents the ED due to persistent rash that started the beginning of this month.  Patient has been treated with doxycycline, penicillin for suspected syphilis, and steroids.  Patient states rash has somewhat improved and he returns today requesting another penicillin shot for suspected syphilis.  Patient's RPR on 5/11 was negative, low suspicion for syphilis.  No fever or  chills.  No new medications or products to suggest allergic reaction.  Upon arrival, stable vitals.  Patient in no acute distress.  Physical exam significant for papular rash throughout entire body that is blanchable.  Rash on palms and soles.  Involved genital region. No mucosal involvement.  No petechiae or purpura. Spotted fever labs negative on 5/11. Will discuss with ID for further recommendations. 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 There is some scaling on exam. No evidence of superimposed bacterial infection.  2:58  PM Discussed with Dr. Renold Don with infectious disease who recommends the following labs: Hepatitis panel, EBV antibody profile, CMV antibody IgG, respiratory panel, HIV RNA, Monospot, CMV IgM.  3:03 PM reassessed patient at bedside to discuss recommendations from infectious disease doctor.  Patient notes that he would prefer to come back and have labs drawn then.  Patient notes that he is concerned about cost of labs.  Had a long discussion with patient this could possibly aid in diagnosis of rash.  Patient still declined labs. ID and dermatology number given at discharge advised to call to schedule appointment for further evaluation.  Patient discharged with 5 days of steroids and Zofran as needed for nausea. No infectious symptoms to suggest meningitis or other infectious etiology. Strict ED precautions discussed with patient. Patient states understanding and agrees to plan. Patient discharged home in no acute distress and stable vitals  No PCP Hx polysubstance abuse       Final Clinical Impression(s) / ED Diagnoses Final diagnoses:  Rash    Rx / DC Orders ED Discharge Orders          Ordered    predniSONE (DELTASONE) 20 MG tablet  Daily        02/17/23 1529    ondansetron (ZOFRAN-ODT) 4 MG disintegrating tablet  Every 8 hours PRN        02/17/23 1529              Mannie Stabile, PA-C 02/17/23 1537    Tegeler, Canary Brim, MD 02/17/23 1540

## 2023-02-17 NOTE — Progress Notes (Signed)
Id brief note  Asked by ED staff [place] Citizen about rash  27 yo male substance abuse, seizure disorder, not insured who has been seen in ed 3 times last 2 weeks for papular erythematous rash involving palms soles   No travel/sick contact No fever/chill No myalgia/arthralgia described  Noted a cut prior to rash onset   Given doxy initially for presumed hot tub folliculitis, and then and pcn/steroid during recent w/u. Rpr testing negative; hiv ab negative   Reviewed picutres, there are a few squamous lesion, and many follicular lesion/papular lesions  Per ed staph rash is blancheable but it appears to be several lesions that are nonblancheable as well   Ddx is wide Viral infection  Mycoplasma infection Arcanobacterium hemolyticum?   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 He appears nontoxic -discussed with ed staffs

## 2023-02-17 NOTE — ED Triage Notes (Signed)
Patient here POV from Home.  Endorses Rash generalized to entire body. Seen for Same 1 week ago and treated with Penicillin Injection. Seeks repeat treatment although Rash has somewhat improved.   NAD Noted during Triage. A&Ox4. CGS 15. Ambulatory.

## 2023-02-17 NOTE — Discharge Instructions (Addendum)
It was a pleasure taking care of you today.  As discussed, I am unsure what is causing your rash.  It could be a type of psoriasis.  I am sending you home with 5 more days of steroids.  Take as prescribed.  I have included the number of the infectious disease doctor, the dermatologist, and [facility].  Please call to schedule an appointment for further evaluation.  Return to the ER for new or worsening symptoms.  As discussed, your syphilis test was negative.

## 2023-02-17 NOTE — ED Notes (Signed)
Pt received AVS: educated on prescription medication and provided follow up resources for further care. Pt advised to come back for re-eval if symptoms worsen. No further questions upon discharge.

## 2023-02-27 ENCOUNTER — Other Ambulatory Visit: Payer: Self-pay

## 2023-02-27 ENCOUNTER — Encounter: Payer: Self-pay | Admitting: Infectious Diseases

## 2023-02-27 ENCOUNTER — Ambulatory Visit (INDEPENDENT_AMBULATORY_CARE_PROVIDER_SITE_OTHER): Payer: BLUE CROSS/BLUE SHIELD | Admitting: Infectious Diseases

## 2023-02-27 VITALS — BP 121/75 | HR 65 | Temp 98.1°F | Resp 16 | Wt 144.0 lb

## 2023-02-27 DIAGNOSIS — Z0289 Encounter for other administrative examinations: Secondary | ICD-10-CM

## 2023-02-27 DIAGNOSIS — R21 Rash and other nonspecific skin eruption: Secondary | ICD-10-CM | POA: Diagnosis not present

## 2023-02-27 DIAGNOSIS — Z7251 High risk heterosexual behavior: Secondary | ICD-10-CM | POA: Diagnosis not present

## 2023-02-27 NOTE — Progress Notes (Incomplete)
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   CLONAZEPAM (KLONOPIN) 1 MG TABLET    Take 1 mg by mouth 2 (two) times daily as needed.   DOXYCYCLINE (VIBRAMYCIN) 100 MG CAPSULE    Take 1 capsule (100 mg total) by mouth 2 (two) times daily.   GABAPENTIN (NEURONTIN) 600 MG TABLET    Take 600 mg by mouth 2 (two) times daily.   METHYLPREDNISOLONE (MEDROL DOSEPAK) 4 MG TBPK TABLET    Use per package directions.   ONDANSETRON (ZOFRAN-ODT) 4 MG DISINTEGRATING TABLET    Take 1 tablet (4 mg total) by mouth every 8 (eight) hours as needed for nausea or vomiting.   PREDNISONE (DELTASONE) 20 MG TABLET    Take 3 daily for 2 days, then 2 daily for 2 days, then 1 daily for 2 days for rash.  Take with food   TRIAMCINOLONE (KENALOG) 0.1 %    Apply 1 application topically 2 (two) times daily.  Modified Medications   No medications on file  Discontinued Medications   No medications on file    Subjective: 27 YO male with h/o seizure d/o, polysubstance abuse, anxiety d/o/depression/ADHD, BZD use d/o who is referred from ED for evaluation of generalized rashes of unclear origin. History obtained from patient as well as chart.   Reports having infection in the rt  middle finger approx a month ago when he had a cut. This was later followed by bumps in his rt arm, seen by PCP was prescribed amoxicillin for cellulitis. He then had progressive rashes in his left arm then spread to his legs, torso and face over next few days. Rashes were painful in the beginning but now itchy. It also included his face which was swollen. He also reports getting sore on his penile area that comes on and off. Was seen in the ED 5/6 where he was given course of doxycyline for concerns for possible hot folliculitis ( reported he went to house party and went into hot tube after he cut his hand before spread of rashes). He returned back to ED 5/11 covered with rashes in his whole body. Rashes noted to be more in the upper extremities than lower, itchy and scaly, involved palms and soles. 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 RMSF serology and HIV ag/ab was NR. Patient was discharged on Medrol dosepak. He came to ED 5/18 requesting another shot of penicillin injection as felt some improvement with prior shot of penicillin G vs steroid course esp on his face. ID Dr Renold Don had recommended some ID labs but patient did not want to and was discharged on short course of prednisone and zofran.   Reports having multiple sexual partners, all are  females. Does not use condoms consistently. Last sexual encounter was 2 days ago. Smokes 4-5 cigarettes a day. Denies alcohol and IVDU however has h/o substance abuse. Works as a Theatre stage manager in [employer] and has not been working for a month since the start of rashes. Denies any insect bite or tick bite or use of any new cream/shampoo  He follows Psychiatry for management of Psychiatric conditions.  Mother reports her brother had an auto immune condition  Denies fevers, chills Denies nausea, vomiting, abdominal pain and diarrhea.  Denies cough, chest pain and SOB Denies headache, blurry vision, new weakness, numbness   Denies rashes or ulcers in the mouth, joint pain Denies dysuria, penile discharge, ulcers but has small bumps in his bilateral groin noted recently  Review of Systems: all systems reviewed with pertinent positives and negatives as listed above  Past Medical History:  Diagnosis Date   ADHD    Anxiety    Asthma    MDD (major depressive disorder), recurrent episode, severe (HCC) 05/13/2020   Seizure disorder (HCC) 07/24/2019   Seizures (HCC)    Past Surgical History:  Procedure Laterality Date   ADENOIDECTOMY      Social History   Tobacco Use   Smoking status: Light Smoker    Packs/day: 1.00    Years: 9.00    Additional pack years: 0.00    Total pack years: 9.00    Types: Cigarettes    Last attempt to quit: 09/30/2018    Years since quitting: 4.4   Smokeless tobacco: Former  Building services engineer Use: Every day   Substances: Nicotine  Substance Use Topics   Alcohol use: Yes    Alcohol/week: 2.0 standard drinks of alcohol    Types: 2 Cans of beer per week    Comment: drinks 2 beers every other day   Drug use: Not Currently    Family History  Problem Relation Age of Onset   Hypertension Father     No Known Allergies  Health Maintenance  Topic Date Due   COVID-19 Vaccine (1) Never done   HPV VACCINES (1 - Male 2-dose series) Never done   Hepatitis C Screening  Never done   INFLUENZA VACCINE  05/03/2023   DTaP/Tdap/Td (2 - Td or Tdap) 09/01/2027   HIV Screening  Completed    Objective: BP 121/75   Pulse 65   Temp 98.1 F (36.7 C) (Oral)   Resp 16   Wt 144 lb (65.3 kg)   SpO2 100%   BMI 20.08 kg/m   Physical Exam Constitutional:      Appearance: Normal appearance.  HENT:     Head: Normocephalic and atraumatic.      Mouth: Mucous membranes are moist. No oral ulcers Eyes:    Conjunctiva/sclera: Conjunctivae normal.     Pupils: Pupils are equal, round, and bilaterally symmetrical  Cardiovascular:     Rate and Rhythm: Normal rate and regular rhythm.      Heart sounds: s1s2  Pulmonary:     Effort: Pulmonary effort is normal.     Breath sounds: Normal breath sounds.   Abdominal:     General: Non distended     Palpations: soft.   Musculoskeletal:        General: Normal range of motion.   Skin:    General: Skin is warm and dry.     Comments: generalized papular rashes in the upper and lower extremities, torso, face some of which are confluent. Blanchable. No petechiae  or purpura. Some of he rashes are dry and scaly. No discharge. Bilateral small palpable lymph nodes in thr groin, non tender, no overlying erythema. Dry scales in the penile shaft, no ulcers or wounds.               Neurological:     General: grossly non focal     Mental Status: awake, alert and oriented to person, place, and time.   Psychiatric:        Mood and Affect: Mood normal.   Lab Results Lab Results  Component Value Date   WBC 8.2 02/10/2023   HGB 16.6 02/10/2023   HCT 47.9 02/10/2023   MCV 87.9 02/10/2023   PLT 228 02/10/2023    Lab Results  Component Value Date   CREATININE 0.73 02/10/2023   BUN 11 02/10/2023   NA 140 02/10/2023   K 3.6 02/10/2023   CL 104 02/10/2023   CO2 26 02/10/2023    Lab Results  Component Value Date   ALT 8 02/10/2023   AST 16 02/10/2023   ALKPHOS 76 02/10/2023   BILITOT 0.9 02/10/2023    Lab Results  Component Value Date   CHOL 166 05/14/2020   HDL 68 05/14/2020   LDLCALC 61 05/14/2020   TRIG 187 (H) 05/14/2020   CHOLHDL 2.4 05/14/2020   Lab Results  Component Value Date   LABRPR NON REACTIVE 02/10/2023   No results found for: "HIV1RNAQUANT", "HIV1RNAVL", "CD4TABS"  Assessment/Plan 27 YO male with h/o seizure d/o, polysubstance abuse, anxiety d/o/depression/ADHD, BZD use d/o who is referred from ED for evaluation of generalized rashes of unclear origin.   # Generalized papular rashes  - scaly, itchy, non painful, confluent, blanchable. No mucosal involvement - s/p 10 days course of  doxycycline prescribed 5/6 - s/p 1 dose of benzathine pen G 5/11 - s/p course of medrol dose pak 5/11 then short course of prednisone - Differentials wide infectious vs non infectious: viral infections, HIV, Hepatitis, Secondary syphilis although RPR was negative, Mononecleosis ( EBV, CMV), Psoriasis.  - Urgent referral to Dermatology  - Fu in 2 weeks to fu as well as review labs Orders Placed This Encounter  Procedures   Respiratory virus panel   HIV Antibody (routine testing w rflx)   Hepatitis B surface antibody,qualitative   Hepatitis B core antibody, total   Hepatitis B surface antigen   Hepatitis C antibody   HIV RNA, RTPCR W/R GT (RTI, PI,INT)   RPR   Mononucleosis screen   Epstein-Barr Virus VCA Antibody Panel   CMV abs, IgG+IgM (cytomegalovirus)   Ambulatory referral to Dermatology    Referral Priority:   Urgent    Referral Type:   Consultation    Referral Reason:   Specialty Services Required    Requested Specialty:   Dermatology    Number of Visits Requested:   1   # High risk sexual behavior Risk for STDs and screening as above  # Work note  - work excuse provided for 2 weeks   I have personally spent at least 60 minutes involved in face-to-face and non-face-to-face activities for this patient on the day of the visit. Professional time spent includes the following activities: Preparing to see the patient (review of tests), Obtaining and/or reviewing separately obtained history (admission/discharge record), Performing a medically appropriate examination and/or evaluation , Ordering medications/tests/procedures, referring and communicating with other health care professionals, Documenting clinical information in the EMR, Independently interpreting results (not separately reported), Communicating results to the patient/family/caregiver, Counseling  and educating the patient/family/caregiver and Care coordination (not separately reported).   Victoriano Lain, MD [facility] 02/27/2023, 10:38 AM

## 2023-02-28 DIAGNOSIS — Z7251 High risk heterosexual behavior: Secondary | ICD-10-CM | POA: Insufficient documentation

## 2023-02-28 DIAGNOSIS — Z0289 Encounter for other administrative examinations: Secondary | ICD-10-CM | POA: Insufficient documentation

## 2023-02-28 DIAGNOSIS — R21 Rash and other nonspecific skin eruption: Secondary | ICD-10-CM | POA: Insufficient documentation

## 2023-02-28 LAB — EPSTEIN-BARR VIRUS VCA ANTIBODY PANEL: EBV VCA IgG: 76.9 U/mL — ABNORMAL HIGH

## 2023-02-28 LAB — RESPIRATORY VIRUS PANEL

## 2023-02-28 LAB — URINE CYTOLOGY ANCILLARY ONLY
Chlamydia: NEGATIVE
Comment: NEGATIVE
Comment: NORMAL
Neisseria Gonorrhea: NEGATIVE

## 2023-02-28 LAB — MONONUCLEOSIS SCREEN: Heterophile, Mono Screen: NEGATIVE

## 2023-02-28 LAB — RPR: RPR Ser Ql: NONREACTIVE

## 2023-02-28 LAB — HIV ANTIBODY (ROUTINE TESTING W REFLEX): HIV 1&2 Ab, 4th Generation: NONREACTIVE

## 2023-02-28 LAB — HEPATITIS B CORE ANTIBODY, TOTAL: Hep B Core Total Ab: NONREACTIVE

## 2023-02-28 LAB — CMV ABS, IGG+IGM (CYTOMEGALOVIRUS): CMV IgM: <30 AU/mL

## 2023-03-01 ENCOUNTER — Encounter: Payer: Self-pay | Admitting: Dermatology

## 2023-03-01 ENCOUNTER — Ambulatory Visit: Payer: BLUE CROSS/BLUE SHIELD | Admitting: Dermatology

## 2023-03-01 VITALS — BP 131/87

## 2023-03-01 DIAGNOSIS — R21 Rash and other nonspecific skin eruption: Secondary | ICD-10-CM

## 2023-03-01 DIAGNOSIS — L409 Psoriasis, unspecified: Secondary | ICD-10-CM | POA: Diagnosis not present

## 2023-03-01 DIAGNOSIS — L309 Dermatitis, unspecified: Secondary | ICD-10-CM

## 2023-03-01 MED ORDER — TRIAMCINOLONE ACETONIDE 0.1 % EX CREA: 1.0000 | TOPICAL_CREAM | Freq: Two times a day (BID) | CUTANEOUS | 0 refills | Status: DC

## 2023-03-01 MED ORDER — HYDROCORTISONE 2.5 % EX CREA: TOPICAL_CREAM | Freq: Two times a day (BID) | CUTANEOUS | 11 refills | Status: DC

## 2023-03-01 MED ORDER — NAPROXEN 250 MG PO TABS: 250.0000 mg | ORAL_TABLET | Freq: Two times a day (BID) | ORAL | 0 refills | Status: DC

## 2023-03-01 MED ORDER — PREDNISONE 10 MG PO TABS: ORAL_TABLET | ORAL | 0 refills | Status: AC

## 2023-03-01 NOTE — Patient Instructions (Addendum)
Diagnosis:  Drug Rash associated with taking Antibiotic while having Infectious Mononucleosis  -  Your labs show that you had Mono recently but it's not active This rash will resolve with time.  -Apply the topical steroid creams and take the prednisone as directed   -You will return in 2 weeks to have the sutures removed and review the results of the skin biopsy  Patient Handout: Wound Care for Skin Biopsy Site  Patient Handout: Wound Care for Skin Biopsy Site  Taking Care of Your Skin Biopsy Site  Proper care of the biopsy site is essential for promoting healing and minimizing scarring. This handout provides instructions on how to care for your biopsy site to ensure optimal recovery.  1. Cleaning the Wound:  Clean the biopsy site daily with gentle soap and water. Gently pat the area dry with a clean, soft towel. Avoid harsh scrubbing or rubbing the area, as this can irritate the skin and delay healing.  2. Applying Aquaphor and Bandage:  After cleaning the wound, apply a thin layer of Aquaphor ointment to the biopsy site. Cover the area with a sterile bandage to protect it from dirt, bacteria, and friction. Change the bandage daily or as needed if it becomes soiled or wet.  3. Continued Care for One Week:  Repeat the cleaning, Aquaphor application, and bandaging process daily for one week following the biopsy procedure. Keeping the wound clean and moist during this initial healing period will help prevent infection and promote optimal healing.  4. Massaging Aquaphor into the Area:  ---After one week, discontinue the use of bandages but continue to apply Aquaphor to the biopsy site. ----Gently massage the Aquaphor into the area using circular motions. ---Massaging the skin helps to promote circulation and prevent the formation of scar tissue.   Additional Tips:  Avoid exposing the biopsy site to direct sunlight during the healing process, as this can cause hyperpigmentation  or worsen scarring. If you experience any signs of infection, such as increased redness, swelling, warmth, or drainage from the wound, contact your healthcare provider immediately. Follow any additional instructions provided by your healthcare provider for caring for the biopsy site and managing any discomfort. Conclusion:  Taking proper care of your skin biopsy site is crucial for ensuring optimal healing and minimizing scarring. By following these instructions for cleaning, applying Aquaphor, and massaging the area, you can promote a smooth and successful recovery. If you have any questions or concerns about caring for your biopsy site, don't hesitate to contact your healthcare provider for guidance.           Patient Handout: Wound Care for Skin Biopsy Site  Patient Handout: Wound Care for Skin Biopsy Site  Taking Care of Your Skin Biopsy Site  Proper care of the biopsy site is essential for promoting healing and minimizing scarring. This handout provides instructions on how to care for your biopsy site to ensure optimal recovery.  1. Cleaning the Wound:  Clean the biopsy site daily with gentle soap and water. Gently pat the area dry with a clean, soft towel. Avoid harsh scrubbing or rubbing the area, as this can irritate the skin and delay healing.  2. Applying Aquaphor and Bandage:  After cleaning the wound, apply a thin layer of Aquaphor ointment to the biopsy site. Cover the area with a sterile bandage to protect it from dirt, bacteria, and friction. Change the bandage daily or as needed if it becomes soiled or wet.  3. Continued Care for One  Week:  Repeat the cleaning, Aquaphor application, and bandaging process daily for one week following the biopsy procedure. Keeping the wound clean and moist during this initial healing period will help prevent infection and promote optimal healing.  4. Massaging Aquaphor into the Area:  ---After one week, discontinue the use of  bandages but continue to apply Aquaphor to the biopsy site. ----Gently massage the Aquaphor into the area using circular motions. ---Massaging the skin helps to promote circulation and prevent the formation of scar tissue.   Additional Tips:  Avoid exposing the biopsy site to direct sunlight during the healing process, as this can cause hyperpigmentation or worsen scarring. If you experience any signs of infection, such as increased redness, swelling, warmth, or drainage from the wound, contact your healthcare provider immediately. Follow any additional instructions provided by your healthcare provider for caring for the biopsy site and managing any discomfort. Conclusion:  Taking proper care of your skin biopsy site is crucial for ensuring optimal healing and minimizing scarring. By following these instructions for cleaning, applying Aquaphor, and massaging the area, you can promote a smooth and successful recovery. If you have any questions or concerns about caring for your biopsy site, don't hesitate to contact your healthcare provider for guidance.    Due to recent changes in healthcare laws, you may see results of your pathology and/or laboratory studies on MyChart before the doctors have had a chance to review them. We understand that in some cases there may be results that are confusing or concerning to you. Please understand that not all results are received at the same time and often the doctors may need to interpret multiple results in order to provide you with the best plan of care or course of treatment. Therefore, we ask that you please give [place] 2 business days to thoroughly review all your results before contacting the office for clarification. Should we see a critical lab result, you will be contacted sooner.   If You Need Anything After Your Visit  If you have any questions or concerns for your doctor, please call our main line at [phone] If no one answers, please leave a  voicemail as directed and we will return your call as soon as possible. Messages left after 4 pm will be answered the following business day.   You may also send [place] a message via MyChart. We typically respond to MyChart messages within 1-2 business days.  For prescription refills, please ask your pharmacy to contact our office. Our fax number is [phone].  If you have an urgent issue when the clinic is closed that cannot wait until the next business day, you can page your doctor at the number below.    Please note that while we do our best to be available for urgent issues outside of office hours, we are not available 24/7.   If you have an urgent issue and are unable to reach [place], you may choose to seek medical care at your doctor's office, retail clinic, urgent care center, or emergency room.  If you have a medical emergency, please immediately call 911 or go to the emergency department. In the event of inclement weather, please call our main line at [phone] for an update on the status of any delays or closures.  Dermatology Medication Tips: Please keep the boxes that topical medications come in in order to help keep track of the instructions about where and how to use these. Pharmacies typically print the medication instructions only on  the boxes and not directly on the medication tubes.   If your medication is too expensive, please contact our office at [phone] or send [place] a message through MyChart.   We are unable to tell what your co-pay for medications will be in advance as this is different depending on your insurance coverage. However, we may be able to find a substitute medication at lower cost or fill out paperwork to get insurance to cover a needed medication.   If a prior authorization is required to get your medication covered by your insurance company, please allow [place] 1-2 business days to complete this process.  Drug prices often vary depending on where the  prescription is filled and some pharmacies may offer cheaper prices.  The website www.goodrx.com contains coupons for medications through different pharmacies. The prices here do not account for what the cost may be with help from insurance (it may be cheaper with your insurance), but the website can give you the price if you did not use any insurance.  - You can print the associated coupon and take it with your prescription to the pharmacy.  - You may also stop by our office during regular business hours and pick up a GoodRx coupon card.  - If you need your prescription sent electronically to a different pharmacy, notify our office through [facility] or by phone at [phone]

## 2023-03-01 NOTE — Progress Notes (Signed)
New Patient Visit   Subjective  Justin May is a 27 y.o. male who presents for the following: Rash is on face, extremities, and torso x 1 month. It is painful, itchy, burning, red and dry. It is a 9 on pain scale. Uncle may have Psoriasis. Significant stress before it began. He had oral Prednisone which he started about 1 week ago. His face may become a better. He has been to the ER and was given Amoxicillin. Rash became worse. His face was swollen. He went back to ER and the Prednisone helped his face.  He had dizziness, arthralgias, and fatigue before the ER visits. A few friends had Strep throat. He had a blister on the right inner lower lip that lasted a few days before rash. 3-4 days ago he had symptoms of confusion, dizziness, lack of coordination, slurred speech in the morning after having alcoholic beverages the night before.  The following portions of the chart were reviewed this encounter and updated as appropriate: medications, allergies, medical history  Review of Systems:  No other skin or systemic complaints except as noted in HPI or Assessment and Plan.  Objective  Well appearing patient in no apparent distress; mood and affect are within normal limits.  A focused examination was performed of the following areas: Face, neck, torso, extremities  Relevant exam findings are noted in the Assessment and plan.  Respiratory virus panel Dx: Rash and other nonspecific skin eruption   Specimen Information: Nasal Swab; Respiratory  0 Result Notes    Component Ref Range & Units 2 d ago  Adenovirus B Not Detected Not Detected  Rhinovirus Not Detected Not Detected  Influenza A Not Detected Not Detected  INFLUENZA A SUBTYPE H1 Not Detected Not Detected  INFLUENZA A SUBTYPE H3 Not Detected Not Detected  Influenza B Not Detected Not Detected  Metapneumovirus Not Detected Not Detected  Respiratory Syncytial Virus A Not Detected Not Detected  Respiratory Syncytial  Virus B Not Detected Not Detected  HUMAN PARAINFLU VIRUS 1 Not Detected Not Detected  HUMAN PARAINFLU VIRUS 2 Not Detected Not Detected  HUMAN PARAINFLU VIRUS 3 Not Detected Not Detected  Comment see note  Comment: THIS ASSAY WILL NOT DETECT SARS-CoV-2 (COVID-19) . This test is performed using the [employer]. . . Limitations: A negative result does not rule out respiratory pathogen infection below the sensitivity limit of the assay. The sensitivity depends on pathogen and sample type. This assay cannot reliably distinguish between Rhinovirus and Enterovirus due to genetic similarities between the viruses. .  Resulting Agency [facility]         Resulting Agency's Comment  Performing Organization Information:     Site ID: [id number] (CLIA: [id number])     Name: [facility]     Address: [address] [place], [place] [address]     Director: Benjamine Mola MD            Other Results from 02/27/2023  HIV Antibody (routine testing w rflx)      Component Ref Range & Units 2 d ago 3 yr ago  HIV 1&2 Ab, 4th Generation NON-REACTIVE NON-REACTIVE NON-REACTIVE R, CM  Comment: HIV-1 antigen and HIV-1/HIV-2 antibodies were not detected. There is no laboratory evidence of HIV infection. Marland Kitchen PLEASE NOTE: This information has been disclosed to you from records whose confidentiality may be protected by state law.  If your state requires such protection, then the state law prohibits you from making any further disclosure of  the information without the specific written consent of the person to whom it pertains, or as otherwise permitted by law. A general authorization for the release of medical or other information is NOT sufficient for this purpose. . For additional information please refer to http://education.[facility].com/faq/FAQ106 (This link is being provided for informational/ educational purposes  only.) . Marland Kitchen The performance of this assay has not been clinically validated in patients less than 68 years old. Marland Kitchen  Resulting Agency [facility] [place] [facility]     Hepatitis B surface antibody,qualitative     Component Ref Range & Units 2 d ago  Hep B S Ab NON-REACTIVE NON-REACTIVE  Resulting Agency [facility] [place]     Hepatitis B core antibody, total       Component Ref Range & Units 2 d ago  Hep B Core Total Ab NON-REACTIVE NON-REACTIVE  Comment: . For additional information, please refer to http://education.[facility].com/faq/FAQ202 (This link is being provided for informational/ educational purposes only.) .  Resulting Agency [facility] [place]     Hepatitis B surface antigen      Component Ref Range & Units 2 d ago  Hepatitis B Surface Ag NON-REACTIVE NON-REACTIVE  Comment: . For additional information, please refer to http://education.[facility].com/faq/FAQ202 (This link is being provided for informational/ educational purposes only.) .  Resulting Agency [facility] [place]     Hepatitis C antibody      Component Ref Range & Units 2 d ago  Hepatitis C Ab NON-REACTIVE NON-REACTIVE  Comment: . HCV antibody was non-reactive. There is no laboratory evidence of HCV infection. . In most cases, no further action is required. However, if recent HCV exposure is suspected, a test for HCV RNA (test code 47829) is suggested. . For additional information please refer to http://education.[facility].com/faq/FAQ22v1 (This link is being provided for informational/ educational purposes only.) .  Resulting Agency [facility]          Resulting Agency's Comment  Performing Organization Information:     Site IDGweneth Fritter (CLIA: [id number])     Name: [facility]     Address: [address] [place], [place] [address]     Director: Marica Otter MD    Specimen  Collected: 02/27/23 11:13 Last Resulted: 02/28/23 12:55      View All Conversations on this Encounter         Back to Top Hepatitis C Screening  Tentatively Complete          HIV RNA, RTPCR W/R GT (RTI, PI,INT)      Specimen Collected: 02/27/23 11:13 Last Resulted: 02/28/23 20:45     View All Conversations on this Encounter        Result Care Coordination   Patient Communication   Not Released  Not seen Back to Top        RPR       Component Ref Range & Units 2 d ago 2 wk ago 3 yr ago  RPR Ser Ql NON-REACTIVE NON-REACTIVE NON REACTIVE R, CM NON-REACTIVE R  Comment: . No laboratory evidence of syphilis. If recent exposure is suspected, submit a new sample in 2-4 weeks. .    [facility]         Resulting Agency's Comment  Performing Organization Information:     Site ID: AT (CLIA: [id number])     Name: [facility]     Address: [address] [place], [place] [address]     Director: Thressa Sheller MD    Specimen Collected: 02/27/23 11:13  Last Resulted: 02/28/23 18:32     View All Conversations on this Encounter      CM=Additional comments  R=Reference range differs from displayed range             Mononucleosis screen     Component Ref Range & Units 2 d ago  Heterophile, Mono Screen NEGATIVE NEGATIVE  Resulting Agency [facility]      Contains abnormal data Epstein-Barr Virus VCA Antibody Panel     Component Ref Range & Units 2 d ago  EBV VCA IgM U/mL <36.00  Comment:       U/mL              Interpretation       ----              --------------       <36.00            Negative       36.00-43.99       Equivocal       >43.99            Positive  EBV VCA IgG U/mL 76.90 High   Comment:        U/mL             Interpretation        ----             --------------        <18.00           Negative        18.00-21.99      Equivocal        >21.99           Positive  EBV NA IgG U/mL 21.60 High    Comment:        U/mL             Interpretation        ----             --------------        <18.00           Negative        18.00-21.99      Equivocal        >21.99           Positive  Interpretation   Comment: . Suggestive of a recent or past Epstein-Barr virus infection. .  Resulting Agency [facility]         Resulting Agency's Comment  Performing Organization Information:     Site ID: AT (CLIA: [id number])     Name: [facility]     Address: [address] [place], [place] [address]     Director: Thressa Sheller MD    Specimen Collected: 02/27/23 11:13 Last Resulted: 02/28/23 19:31      Lab Flowsheet         View All Conversations on this Encounter        Result Care Coordination   Patient Communication   Add Comments   Add Notifications  Back to Top         Contains abnormal data CMV abs, IgG+IgM (cytomegalovirus) Order: [id number] Status: Final result      Visible to patient: Yes (not seen)      Next appt: 03/13/2023 at 09:30 AM in Infectious Diseases Victoriano Lain, MD)      Dx: Rash and other nonspecific skin eruption    0 Result Notes  Component Ref Range & Units 2 d ago  Cytomegalovirus Ab-IgG U/mL 7.10 High   Comment:                            U/mL         Interpretation                            -----         --------------                            <0.60         Negative                            0.60-0.69     Equivocal                            > or = 0.70   Positive . A positive result indicates that the patient has antibody to CMV. It does not differentiate between an active or past infection.  CMV IgM AU/mL <30.00  Comment:     AU/mL                 Interpretation     -----                 --------------     <30.00                No Antibody Detected     30.00-34.99           Equivocal     > or = 35.00          Antibody Detected . Results from any one IgM assay should not be used as  a sole determinant of a current or recent infection. Because an IgM test can yield false positive results and low level IgM antibody may persist for more than 12 months post infection, reliance on a single test result could be misleading. Acute infection is best diagnosed by demonstrating the conversion of IgG from negative to positive. If an acute infection is suspected, consider obtaining a new specimen and submit for both IgG and IgM testing in two or more weeks. Marland Kitchen  Resulting Agency [facility]         Resulting Agency's Comment  Performing Organization Information:     Site ID: AT (CLIA: [id number])     Name: [facility]     Address: [address] [place], [place] [address]     Director: Thressa Sheller MD    Specimen Collected: 02/27/23 11:13 Last Resulted: 02/28/23 20:45      Lab Flowsheet       Order Details       View Encounter       Lab and Collection Details       Routing       Result History     View All Conversations on this Encounter        Result Care Coordination   Patient Communication   Add Comments   Add Notifications  Back to Top        Urine cytology ancillary only Order: [id number] Status: Edited Result - FINAL      Visible to patient: Yes (not seen)  Next appt: 03/13/2023 at 09:30 AM in Infectious Diseases Victoriano Lain, MD)      Dx: Rash and other nonspecific skin eruption    0 Result Notes      Component 2 d ago  Neisseria Gonorrhea Negative  Chlamydia Negative  Comment Normal Reference Ranger Chlamydia - Negative  Comment Normal Reference Range Neisseria Gonorrhea - Negative  Resulting Agency [facility]         Assessment & Plan   Dermatitis Right Abdomen (side) - Upper  Skin / nail biopsy - Right Abdomen (side) - Upper Type of biopsy: punch   Informed consent: discussed and consent obtained   Timeout: patient name, date of birth, surgical site, and procedure verified    Anesthesia: the lesion was anesthetized in a standard fashion   Anesthetic:  1% lidocaine w/ epinephrine 1-100,000 buffered w/ 8.4% NaHCO3 Punch size:  4 mm Suture size:  4-0 Suture type: nylon   Suture removal (days):  14 Hemostasis achieved with: suture   Outcome: patient tolerated procedure well   Post-procedure details: sterile dressing applied and wound care instructions given   Dressing type: bandage and petrolatum    Specimen 1 - Surgical pathology Differential Diagnosis: rule out viral exanthem vs drug rash (pt developed rash after taking amoxicillin and recent EBV test was positive)  Check Margins: No  Related Medications triamcinolone (KENALOG) 0.1 % Apply 1 application topically 2 (two) times daily.   Morbilliform Drug Rash / Viral Exanthem (Possible Antibiotic- Induced Rash in patient with infectious Mononucleosis) Exam: Diffusely spread erythematous scaly papules coalescing into plaques.  After reviewing pt history, ED notes, laboratory results and physical exam findings, his rash is most consistent with a diagnosis of Drug Rash / Viral Exanthem secondary to antibiotic administration with infectious mononucleosis.  Pt admits to symptoms of sore/scratchy throat,  fatigue and arthralgias prior to presenting to the hospital.  Labs were reviewed and EBV was positive for recent infection.  HIV, RPR, Hepatitis Panel, Respiratory Virus Panel were all negative.  Treatment Plan: -Triamcinolone cream 2 x daily until clear. -Naproxen 250mg  tabs 2 x daily with meals for up to 2 weeks. -Cerave Anti-itch cream twice daily -Prednisone taper -Labs reviewed  Return in about 2 weeks (around 03/15/2023) for Suture Removal.  I, Mosetta Anis, CMA, am acting as scribe for [employer], DO.   Documentation: I have reviewed the above documentation for accuracy and completeness, and I agree with the above.  Langston Reusing, DO

## 2023-03-02 ENCOUNTER — Encounter: Payer: Self-pay | Admitting: Infectious Diseases

## 2023-03-02 ENCOUNTER — Telehealth: Payer: Self-pay

## 2023-03-02 LAB — EPSTEIN-BARR VIRUS VCA ANTIBODY PANEL
EBV NA IgG: 21.6 U/mL — ABNORMAL HIGH
EBV VCA IgM: <36 U/mL

## 2023-03-02 LAB — HIV RNA, RTPCR W/R GT (RTI, PI,INT)
HIV 1 RNA Quant: NOT DETECTED copies/mL
HIV-1 RNA Quant, Log: NOT DETECTED Log copies/mL

## 2023-03-02 LAB — HEPATITIS C ANTIBODY: Hepatitis C Ab: NONREACTIVE

## 2023-03-02 LAB — CMV ABS, IGG+IGM (CYTOMEGALOVIRUS): Cytomegalovirus Ab-IgG: 7.1 U/mL — ABNORMAL HIGH

## 2023-03-02 LAB — HEPATITIS B SURFACE ANTIBODY,QUALITATIVE: Hep B S Ab: NONREACTIVE

## 2023-03-02 LAB — HEPATITIS B SURFACE ANTIGEN: Hepatitis B Surface Ag: NONREACTIVE

## 2023-03-02 NOTE — Telephone Encounter (Signed)
Spoke with patient, informed him that Dr. Elinor Parkinson is not a [facility] provider. Patient verbalized understanding.

## 2023-03-02 NOTE — Telephone Encounter (Signed)
-----   Message from Raymondo Band, MD sent at 03/02/2023 10:22 AM EDT ----- All viral testing do not suggest any acute viral process contributing to the rash   Please let patient know to see dermatology and follow up with [place] after dermatologic evaluation  Thank you

## 2023-03-13 ENCOUNTER — Ambulatory Visit: Payer: BLUE CROSS/BLUE SHIELD | Admitting: Infectious Diseases

## 2023-03-15 ENCOUNTER — Ambulatory Visit: Payer: BLUE CROSS/BLUE SHIELD | Admitting: Dermatology

## 2023-03-20 ENCOUNTER — Ambulatory Visit: Payer: BLUE CROSS/BLUE SHIELD | Admitting: Dermatology

## 2023-03-21 ENCOUNTER — Ambulatory Visit (INDEPENDENT_AMBULATORY_CARE_PROVIDER_SITE_OTHER): Payer: Self-pay

## 2023-03-21 ENCOUNTER — Telehealth: Payer: Self-pay

## 2023-03-21 DIAGNOSIS — Z4802 Encounter for removal of sutures: Secondary | ICD-10-CM

## 2023-03-21 NOTE — Telephone Encounter (Addendum)
I called him again today. He had an appointment yesterday that he no showed. He said he had other things to do and asked to be re-scheduled. He has a change in [employer] and is concerned about visit getting covered. I let him know there is no co-pay if its just for the s/r and that he should have the stitches taken out. I advised him to bring his new Insurance card and show it to the front desk. If Dr. Onalee Hua participates with his Monia Pouch there will be an office visit to discuss results and he will have to pay a co-pay. I suggested he give his new Insurance card to the front desk when he gets here.

## 2023-03-21 NOTE — Progress Notes (Signed)
   Follow-Up Visit   Subjective  Justin May is a 27 y.o. male who presents for the following: Suture removal  Pathology showed psorisis.  The following portions of the chart were reviewed this encounter and updated as appropriate: medications, allergies, medical history  Review of Systems:  No other skin or systemic complaints except as noted in HPI or Assessment and Plan.  Objective  Well appearing patient in no apparent distress; mood and affect are within normal limits.  Areas Examined: abdomen Relevant physical exam findings are noted in the Assessment and Plan.    Assessment & Plan    Encounter for Removal of Sutures - Incision site is clean, dry and intact. -Pt missed his appointment time so he will continue topical therapy and he will follow up in the near future to discuss additional treatment option - Wound cleansed, sutures removed, wound cleansed . No follow-ups on file.       Candis Schatz

## 2023-04-19 ENCOUNTER — Ambulatory Visit: Payer: Self-pay | Admitting: Dermatology

## 2023-04-23 ENCOUNTER — Ambulatory Visit (HOSPITAL_COMMUNITY): Admission: EM | Admit: 2023-04-23 | Discharge: 2023-04-24 | Payer: 59 | Attending: Psychiatry | Admitting: Psychiatry

## 2023-04-23 ENCOUNTER — Other Ambulatory Visit: Payer: Self-pay

## 2023-04-23 DIAGNOSIS — F1410 Cocaine abuse, uncomplicated: Secondary | ICD-10-CM

## 2023-04-23 DIAGNOSIS — F1010 Alcohol abuse, uncomplicated: Secondary | ICD-10-CM

## 2023-04-23 DIAGNOSIS — F1310 Sedative, hypnotic or anxiolytic abuse, uncomplicated: Secondary | ICD-10-CM

## 2023-04-23 DIAGNOSIS — F411 Generalized anxiety disorder: Secondary | ICD-10-CM

## 2023-04-23 DIAGNOSIS — F1910 Other psychoactive substance abuse, uncomplicated: Secondary | ICD-10-CM

## 2023-04-23 DIAGNOSIS — R45851 Suicidal ideations: Secondary | ICD-10-CM | POA: Diagnosis not present

## 2023-04-23 DIAGNOSIS — F329 Major depressive disorder, single episode, unspecified: Secondary | ICD-10-CM | POA: Insufficient documentation

## 2023-04-23 DIAGNOSIS — F321 Major depressive disorder, single episode, moderate: Secondary | ICD-10-CM

## 2023-04-23 DIAGNOSIS — F1290 Cannabis use, unspecified, uncomplicated: Secondary | ICD-10-CM

## 2023-04-23 LAB — CBC WITH DIFFERENTIAL/PLATELET
Abs Immature Granulocytes: 0.02 10*3/uL (ref 0.00–0.07)
Basophils Absolute: 0.1 10*3/uL (ref 0.0–0.1)
Basophils Relative: 1 %
Eosinophils Absolute: 0.3 10*3/uL (ref 0.0–0.5)
Eosinophils Relative: 6 %
HCT: 51.2 % (ref 39.0–52.0)
Hemoglobin: 17.2 g/dL — ABNORMAL HIGH (ref 13.0–17.0)
Immature Granulocytes: 0 %
Lymphocytes Relative: 26 %
Lymphs Abs: 1.5 10*3/uL (ref 0.7–4.0)
MCH: 31.1 pg (ref 26.0–34.0)
MCHC: 33.6 g/dL (ref 30.0–36.0)
MCV: 92.6 fL (ref 80.0–100.0)
Monocytes Absolute: 0.7 10*3/uL (ref 0.1–1.0)
Monocytes Relative: 11 %
Neutro Abs: 3.2 10*3/uL (ref 1.7–7.7)
Neutrophils Relative %: 56 %
Platelets: 287 10*3/uL (ref 150–400)
RBC: 5.53 MIL/uL (ref 4.22–5.81)
RDW: 13.5 % (ref 11.5–15.5)
WBC: 5.8 10*3/uL (ref 4.0–10.5)
nRBC: 0 % (ref 0.0–0.2)

## 2023-04-23 LAB — COMPREHENSIVE METABOLIC PANEL
ALT: 26 U/L (ref 0–44)
AST: 35 U/L (ref 15–41)
Albumin: 5.1 g/dL — ABNORMAL HIGH (ref 3.5–5.0)
Alkaline Phosphatase: 92 U/L (ref 38–126)
Anion gap: 13 (ref 5–15)
BUN: 7 mg/dL (ref 6–20)
CO2: 27 mmol/L (ref 22–32)
Calcium: 9.8 mg/dL (ref 8.9–10.3)
Chloride: 103 mmol/L (ref 98–111)
Creatinine, Ser: 0.81 mg/dL (ref 0.61–1.24)
GFR, Estimated: >60 mL/min (ref >60)
Glucose, Bld: 40 mg/dL — CL (ref 70–99)
Potassium: 4.5 mmol/L (ref 3.5–5.1)
Sodium: 143 mmol/L (ref 135–145)
Total Bilirubin: 0.6 mg/dL (ref 0.3–1.2)
Total Protein: 7.8 g/dL (ref 6.5–8.1)

## 2023-04-23 LAB — POCT URINE DRUG SCREEN - MANUAL ENTRY (I-SCREEN)
POC Amphetamine UR: NOT DETECTED — AB
POC Buprenorphine (BUP): NOT DETECTED
POC Cocaine UR: POSITIVE — AB
POC Marijuana UR: POSITIVE — AB
POC Methadone UR: NOT DETECTED
POC Methamphetamine UR: POSITIVE — AB
POC Morphine: NOT DETECTED
POC Oxazepam (BZO): POSITIVE — AB
POC Oxycodone UR: NOT DETECTED
POC Secobarbital (BAR): NOT DETECTED

## 2023-04-23 LAB — URINALYSIS, ROUTINE W REFLEX MICROSCOPIC
Bilirubin Urine: NEGATIVE
Glucose, UA: NEGATIVE mg/dL
Hgb urine dipstick: NEGATIVE
Ketones, ur: NEGATIVE mg/dL
Leukocytes,Ua: NEGATIVE
Nitrite: NEGATIVE
Protein, ur: NEGATIVE mg/dL
Specific Gravity, Urine: 1.02 (ref 1.005–1.030)
pH: 6 (ref 5.0–8.0)

## 2023-04-23 LAB — GLUCOSE, CAPILLARY: Glucose-Capillary: 125 mg/dL — ABNORMAL HIGH (ref 70–99)

## 2023-04-23 LAB — ETHANOL: Alcohol, Ethyl (B): <10 mg/dL (ref <10)

## 2023-04-23 MED ORDER — THIAMINE MONONITRATE 100 MG PO TABS
100.0000 mg | ORAL_TABLET | Freq: Every day | ORAL | Status: DC
  Administered 2023-04-24: 100 mg via ORAL
  Filled 2023-04-23 (×2): qty 1

## 2023-04-23 MED ORDER — ALUM & MAG HYDROXIDE-SIMETH 200-200-20 MG/5ML PO SUSP: 30.0000 mL | ORAL | Status: DC | PRN

## 2023-04-23 MED ORDER — ACETAMINOPHEN 325 MG PO TABS: 650.0000 mg | ORAL_TABLET | Freq: Four times a day (QID) | ORAL | Status: DC | PRN

## 2023-04-23 MED ORDER — GABAPENTIN 300 MG PO CAPS
300.0000 mg | ORAL_CAPSULE | Freq: Three times a day (TID) | ORAL | Status: DC
  Administered 2023-04-23 – 2023-04-24 (×3): 300 mg via ORAL
  Filled 2023-04-23 (×3): qty 1

## 2023-04-23 MED ORDER — FOLIC ACID 1 MG PO TABS
1.0000 mg | ORAL_TABLET | Freq: Every day | ORAL | Status: DC
  Administered 2023-04-23 – 2023-04-24 (×2): 1 mg via ORAL
  Filled 2023-04-23 (×2): qty 1

## 2023-04-23 MED ORDER — THIAMINE HCL 100 MG/ML IJ SOLN
100.0000 mg | Freq: Every day | INTRAMUSCULAR | Status: DC
  Filled 2023-04-23: qty 2

## 2023-04-23 MED ORDER — LORAZEPAM 0.5 MG PO TABS: 0.5000 mg | ORAL_TABLET | Freq: Three times a day (TID) | ORAL | Status: DC

## 2023-04-23 MED ORDER — CLONAZEPAM 0.5 MG PO TABS: 0.5000 mg | ORAL_TABLET | Freq: Three times a day (TID) | ORAL | Status: DC | PRN

## 2023-04-23 MED ORDER — MAGNESIUM HYDROXIDE 400 MG/5ML PO SUSP: 30.0000 mL | Freq: Every day | ORAL | Status: DC | PRN

## 2023-04-23 MED ORDER — ADULT MULTIVITAMIN W/MINERALS CH
1.0000 | ORAL_TABLET | Freq: Every day | ORAL | Status: DC
  Administered 2023-04-23 – 2023-04-24 (×2): 1 via ORAL
  Filled 2023-04-23 (×2): qty 1

## 2023-04-23 MED ORDER — TRAZODONE HCL 50 MG PO TABS
50.0000 mg | ORAL_TABLET | Freq: Every evening | ORAL | Status: DC | PRN
  Administered 2023-04-23: 50 mg via ORAL
  Filled 2023-04-23: qty 1

## 2023-04-23 MED ORDER — HYDROXYZINE HCL 25 MG PO TABS
25.0000 mg | ORAL_TABLET | Freq: Three times a day (TID) | ORAL | Status: DC | PRN
  Administered 2023-04-23: 25 mg via ORAL
  Filled 2023-04-23: qty 1

## 2023-04-23 NOTE — ED Notes (Signed)
Patient observed resting quietly, eyes closed. Respirations equal and unlabored. Will continue to monitor for safety.  

## 2023-04-23 NOTE — ED Provider Notes (Cosign Needed Addendum)
[facility] Continuous Assessment Admission H&P  Date: 04/23/23 Patient Name: Justin May MRN: [id number] Chief Complaint: Suicidal Ideations   Diagnoses:  Final diagnoses:  Current moderate episode of major depressive disorder, unspecified whether recurrent (HCC)  Suicidal ideation  Generalized anxiety disorder  Polysubstance abuse (HCC)  ETOH abuse  Benzodiazepine abuse, continuous (HCC)  Cocaine abuse (HCC)  Marijuana use    HPI:   Patient presents with his mother, Candi Leash, and he consented to her presence before the interview. Patient worsening depressive symptoms the past 2-3 weeks. Reports ongoing SI with consideration of stepping in front of a car. Also decreased sleep, appetite, concentration, depressed mood and feelings of hopelessness.  Denies HI/AVH. Stressors include homelessness the past month and ongoing polysubstance use.   Reports upwards to 1 gram of cocaine x1 monthly, Kratom, and  EtoH abuse (pint of tequila or 25 12 oz beers). Drinking started at 14 and progressed overtime. Is able to get alcohol via friends. Also abuses his klonipin to relieve his anxiety. Is supposed to take klonipin 0.5 mg TID prn but is taking roughly 4 mg daily. Klonipin abuse started 5 years ago with girlfriend and requested a prescription 4 years ago to help with his anxiety. Refilled last filled in July and he has already run out of prescription. Reports current withdrawal symptoms of tiredness. Denies any fevers, chills, diarrhea, headache, blurry vision and tremors. Previously had a seizure while withdrawing on benzodiazepines in October 2020 per chart review. Last seizure was October 2023 per patient, fills dizzy, weak and starts to sweat before seizure occurs. Wake up and feels confused afterwards for a couple hours, with a headache and some urinary incontinence. Denies tongue laceration.   Last evaluated by psychiatrist Dr. Evelene Croon 6 months ago at appointment who also prescribes his  sertraline 25 mg daily. Hasn't been taking because he feels like it doesn't help and worsen his anxiety. Currently on probation until January 2025 for assault and attempted strangulation charge, with ex-girlfriend. Currently unemployed and looking for work. Has been homeless the past month and community of people he was living with, were also using substances. Mom and maternal grandmother have been around for support throughout the years. Can't stay with mom due to arguments and altercations with her boyfriend. Can't stay with grandmother, because his brother lives there and doesn't like him. States that "I've burned all my bridges".   Collateral per Roylene Reason, [phone]  Mom, stated he is not welcome back due to prior incidents at home around substance use. Issues have been ongoing for several years with substance abuse. Other family members also have polysubstance issues on both sides, specifically in her brother who would have seizures 2/2 to substance abuse. Denies any family history of seizure disorder. Denies any seizure activity growing up or throughout the adolescence while the patient lived at home. She believes seizures may have been related to his kratom or other drug use. Unaware of any seizure medications that he's been prescribed, since he left the home at 27 y.o. Believes the patient would benefit from an [facility], however when they inquired they didn't have any availability. Has previously been denied by [facility] due to assault legal charges.   Total Time spent with patient: 20 minutes  Musculoskeletal  Strength & Muscle Tone: within normal limits Gait & Station: normal Patient leans: N/A  Psychiatric Specialty Exam  Presentation General Appearance: Casual  Eye Contact:Fleeting  Speech:Clear and Coherent  Speech Volume:Normal  Handedness:Right  Mood and Affect  Mood:Depressed; Hopeless  Affect:Flat; Depressed   Thought Process  Thought  Processes:Coherent  Descriptions of Associations:Intact  Orientation:Full (Time, Place and Person)  Thought Content:Logical  Diagnosis of Schizophrenia or Schizoaffective disorder in past: No   Hallucinations:Hallucinations: None  Ideas of Reference:None  Suicidal Thoughts:Suicidal Thoughts: Yes, Passive SI Passive Intent and/or Plan: -- (Step in front of car)  Homicidal Thoughts:Homicidal Thoughts: No   Sensorium  Memory:Immediate Fair; Recent Fair  Judgment:Fair  Insight:Fair   Executive Functions  Concentration:Good  Attention Span:Fair  Recall:Fair  Fund of Knowledge:Fair  Language:Good   Psychomotor Activity  Psychomotor Activity:Psychomotor Activity: Normal   Assets  Assets:Desire for Improvement; Manufacturing systems engineer; Social Support   Sleep  Sleep:Sleep: Poor Number of Hours of Sleep: 4   Nutritional Assessment (For OBS and FBC admissions only) Has the patient had a weight loss or gain of 10 pounds or more in the last 3 months?: No Has the patient had a decrease in food intake/or appetite?: Yes Does the patient have dental problems?: No Does the patient have eating habits or behaviors that may be indicators of an eating disorder including binging or inducing vomiting?: No Has the patient recently lost weight without trying?: 0 Has the patient been eating poorly because of a decreased appetite?: 1 Malnutrition Screening Tool Score: 1    Physical Exam Constitutional:      Appearance: He is normal weight.  HENT:     Head: Normocephalic.  Pulmonary:     Effort: Pulmonary effort is normal.  Musculoskeletal:        General: Normal range of motion.  Skin:    General: Skin is warm.     Findings: Rash present.     Comments: Psoriasis, on thighs and arms   Neurological:     Mental Status: He is alert and oriented to person, place, and time.     Motor: No weakness.     Gait: Gait normal.  Psychiatric:        Attention and Perception: He  does not perceive auditory or visual hallucinations.        Mood and Affect: Mood is anxious and depressed. Affect is flat.        Speech: Speech normal.        Behavior: Behavior is not aggressive, hyperactive or combative. Behavior is cooperative.        Thought Content: Thought content is not paranoid or delusional. Thought content includes suicidal ideation. Thought content does not include homicidal ideation. Thought content includes suicidal plan.        Cognition and Memory: Cognition normal.    Review of Systems  Constitutional:  Negative for chills, diaphoresis and fever.  Eyes:  Negative for blurred vision and double vision.  Respiratory:  Negative for cough and hemoptysis.   Cardiovascular:  Negative for chest pain.  Gastrointestinal:  Negative for diarrhea, nausea and vomiting.  Skin:  Positive for rash.  Neurological:  Negative for dizziness, tingling, tremors, seizures, loss of consciousness, weakness and headaches.  Psychiatric/Behavioral:  Positive for depression, substance abuse and suicidal ideas. Negative for hallucinations. The patient is nervous/anxious.     Blood pressure 129/86, pulse 65, temperature 98.1 F (36.7 C), temperature source Oral, resp. rate 18, SpO2 100%. There is no height or weight on file to calculate BMI.  Past Psychiatric History: Prior hospitalization at [facility] was in August 2021 for depression and polysbustance abuse. SI attempt by cutting wrist in 2019.  Family history consistent with polysubstance abuse  with EtOH and schizophrenia in Maternal Uncle.    Is the patient at risk to self? Yes  Has the patient been a risk to self in the past 6 months? Yes .    Has the patient been a risk to self within the distant past? Yes   Is the patient a risk to others? No   Has the patient been a risk to others in the past 6 months? No   Has the patient been a risk to others within the distant past? Yes   Past Medical History: Rash  Family History: Family  history consistent with polysubstance abuse with EtOH and schizophrenia in Maternal Uncle.    Social History: Unemployed. Homeless. Not welcome back in home per mom. Graduated [employer].   Last Labs:  Admission on 04/23/2023  Component Date Value Ref Range Status   WBC 04/23/2023 5.8  4.0 - 10.5 K/uL Final   RBC 04/23/2023 5.53  4.22 - 5.81 MIL/uL Final   Hemoglobin 04/23/2023 17.2 (H)  13.0 - 17.0 g/dL Final   HCT 51/88/4166 51.2  39.0 - 52.0 % Final   MCV 04/23/2023 92.6  80.0 - 100.0 fL Final   MCH 04/23/2023 31.1  26.0 - 34.0 pg Final   MCHC 04/23/2023 33.6  30.0 - 36.0 g/dL Final   RDW 03/31/1600 13.5  11.5 - 15.5 % Final   Platelets 04/23/2023 287  150 - 400 K/uL Final   nRBC 04/23/2023 0.0  0.0 - 0.2 % Final   Neutrophils Relative % 04/23/2023 56  % Final   Neutro Abs 04/23/2023 3.2  1.7 - 7.7 K/uL Final   Lymphocytes Relative 04/23/2023 26  % Final   Lymphs Abs 04/23/2023 1.5  0.7 - 4.0 K/uL Final   Monocytes Relative 04/23/2023 11  % Final   Monocytes Absolute 04/23/2023 0.7  0.1 - 1.0 K/uL Final   Eosinophils Relative 04/23/2023 6  % Final   Eosinophils Absolute 04/23/2023 0.3  0.0 - 0.5 K/uL Final   Basophils Relative 04/23/2023 1  % Final   Basophils Absolute 04/23/2023 0.1  0.0 - 0.1 K/uL Final   Immature Granulocytes 04/23/2023 0  % Final   Abs Immature Granulocytes 04/23/2023 0.02  0.00 - 0.07 K/uL Final   Performed at [facility], [address] [place], [place] [address]   Color, Urine 04/23/2023 YELLOW  YELLOW Final   APPearance 04/23/2023 CLEAR  CLEAR Final   Specific Gravity, Urine 04/23/2023 1.020  1.005 - 1.030 Final   pH 04/23/2023 6.0  5.0 - 8.0 Final   Glucose, UA 04/23/2023 NEGATIVE  NEGATIVE mg/dL Final   Hgb urine dipstick 04/23/2023 NEGATIVE  NEGATIVE Final   Bilirubin Urine 04/23/2023 NEGATIVE  NEGATIVE Final   Ketones, ur 04/23/2023 NEGATIVE  NEGATIVE mg/dL Final   Protein, ur 55/73/2202 NEGATIVE  NEGATIVE mg/dL Final   Nitrite 54/27/0623  NEGATIVE  NEGATIVE Final   Leukocytes,Ua 04/23/2023 NEGATIVE  NEGATIVE Final   Performed at [facility], [address] [place], [place] [address]   POC Amphetamine UR 04/23/2023 None Detected (A)  NONE DETECTED (Cut Off Level 1000 ng/mL) Final   POC Secobarbital (BAR) 04/23/2023 None Detected  NONE DETECTED (Cut Off Level 300 ng/mL) Final   POC Buprenorphine (BUP) 04/23/2023 None Detected  NONE DETECTED (Cut Off Level 10 ng/mL) Final   POC Oxazepam (BZO) 04/23/2023 Positive (A)  NONE DETECTED (Cut Off Level 300 ng/mL) Final   POC Cocaine UR 04/23/2023 Positive (A)  NONE DETECTED (Cut  Off Level 300 ng/mL) Final   POC Methamphetamine UR 04/23/2023 Positive (A)  NONE DETECTED (Cut Off Level 1000 ng/mL) Final   POC Morphine 04/23/2023 None Detected  NONE DETECTED (Cut Off Level 300 ng/mL) Final   POC Methadone UR 04/23/2023 None Detected  NONE DETECTED (Cut Off Level 300 ng/mL) Final   POC Oxycodone UR 04/23/2023 None Detected  NONE DETECTED (Cut Off Level 100 ng/mL) Final   POC Marijuana UR 04/23/2023 Positive (A)  NONE DETECTED (Cut Off Level 50 ng/mL) Final  Office Visit on 02/27/2023  Component Date Value Ref Range Status   HIV 1&2 Ab, 4th Generation 02/27/2023 NON-REACTIVE  NON-REACTIVE Final   Comment: HIV-1 antigen and HIV-1/HIV-2 antibodies were not detected. There is no laboratory evidence of HIV infection. Marland Kitchen PLEASE NOTE: This information has been disclosed to you from records whose confidentiality may be protected by state law.  If your state requires such protection, then the state law prohibits you from making any further disclosure of the information without the specific written consent of the person to whom it pertains, or as otherwise permitted by law. A general authorization for the release of medical or other information is NOT sufficient for this purpose. . For additional information please refer to http://education.[facility].com/faq/FAQ106 (This link is  being provided for informational/ educational purposes only.) . Marland Kitchen The performance of this assay has not been clinically validated in patients less than 59 years old. .    Hep B S Ab 02/27/2023 NON-REACTIVE  NON-REACTIVE Final   Hep B Core Total Ab 02/27/2023 NON-REACTIVE  NON-REACTIVE Final   Comment: . For additional information, please refer to  http://education.[facility].com/faq/FAQ202  (This link is being provided for informational/ educational purposes only.) .    Hepatitis B Surface Ag 02/27/2023 NON-REACTIVE  NON-REACTIVE Final   Comment: . For additional information, please refer to  http://education.[facility].com/faq/FAQ202  (This link is being provided for informational/ educational purposes only.) .    Hepatitis C Ab 02/27/2023 NON-REACTIVE  NON-REACTIVE Final   Comment: . HCV antibody was non-reactive. There is no laboratory  evidence of HCV infection. . In most cases, no further action is required. However, if recent HCV exposure is suspected, a test for HCV RNA (test code 53664) is suggested. . For additional information please refer to http://education.[facility].com/faq/FAQ22v1 (This link is being provided for informational/ educational purposes only.) .    HIV 1 RNA Quant 02/27/2023 NOT DETECTED  copies/mL Final   HIV-1 RNA Quant, Log 02/27/2023 NOT DETECTED  Log copies/mL Final   Comment: REFERENCE RANGE:           NOT DETECTED  copies/mL           NOT DETECTED  Log copies/mL . This test was performed using Real-Time Polymerase Chain Reaction. . Reportable range is 20 to 10,000,000 copies/mL (1.30-7.00 Log copies/mL).    Neisseria Gonorrhea 02/27/2023 Negative   Final   Chlamydia 02/27/2023 Negative   Final   Comment 02/27/2023 Normal Reference Ranger Chlamydia - Negative   Final   Comment 02/27/2023 Normal Reference Range Neisseria Gonorrhea - Negative   Final   RPR Ser Ql 02/27/2023 NON-REACTIVE  NON-REACTIVE Final    Comment: . No laboratory evidence of syphilis. If recent exposure is suspected, submit a new sample in 2-4 weeks. Marland Kitchen    Heterophile, Mono Screen 02/27/2023 NEGATIVE  NEGATIVE Final   EBV VCA IgM 02/27/2023 <36.00  U/mL Final   Comment:       U/mL  Interpretation       ----              --------------       <36.00            Negative       36.00-43.99       Equivocal       >43.99            Positive    EBV VCA IgG 02/27/2023 76.90 (H)  U/mL Final   Comment:        U/mL             Interpretation        ----             --------------        <18.00           Negative        18.00-21.99      Equivocal        >21.99           Positive    EBV NA IgG 02/27/2023 21.60 (H)  U/mL Final   Comment:        U/mL             Interpretation        ----             --------------        <18.00           Negative        18.00-21.99      Equivocal        >21.99           Positive    Interpretation 02/27/2023    Final   Comment: . Suggestive of a recent or past Epstein-Barr virus infection. .    Cytomegalovirus Ab-IgG 02/27/2023 7.10 (H)  U/mL Final   Comment:                            U/mL         Interpretation                            -----         --------------                            <0.60         Negative                            0.60-0.69     Equivocal                            > or = 0.70   Positive . A positive result indicates that the patient has  antibody to CMV. It does not differentiate between an active or past infection.    CMV IgM 02/27/2023 <30.00  AU/mL Final   Comment:     AU/mL                 Interpretation     -----                 --------------     <30.00  No Antibody Detected     30.00-34.99           Equivocal     > or = 35.00          Antibody Detected . Results from any one IgM assay should not be used as a sole determinant of a current or recent infection.  Because an IgM test can yield false positive results and  low  level IgM antibody may persist for more than 12  months post infection, reliance on a single test result  could be misleading. Acute infection is best diagnosed  by demonstrating the conversion of IgG from negative to  positive. If an acute infection is suspected, consider  obtaining a new specimen and submit for both IgG and IgM  testing in two or more weeks. .    Adenovirus B 02/27/2023 Not Detected  Not Detected Final   Rhinovirus 02/27/2023 Not Detected  Not Detected Final   Influenza A 02/27/2023 Not Detected  Not Detected Final   INFLUENZA A SUBTYPE H1 02/27/2023 Not Detected  Not Detected Final   INFLUENZA A SUBTYPE H3 02/27/2023 Not Detected  Not Detected Final   Influenza B 02/27/2023 Not Detected  Not Detected Final   Metapneumovirus 02/27/2023 Not Detected  Not Detected Final   Respiratory Syncytial Virus A 02/27/2023 Not Detected  Not Detected Final   Respiratory Syncytial Virus B 02/27/2023 Not Detected  Not Detected Final   HUMAN PARAINFLU VIRUS 1 02/27/2023 Not Detected  Not Detected Final   HUMAN PARAINFLU VIRUS 2 02/27/2023 Not Detected  Not Detected Final   HUMAN PARAINFLU VIRUS 3 02/27/2023 Not Detected  Not Detected Final   Comment 02/27/2023 see note   Final   Comment: THIS ASSAY WILL NOT DETECT SARS-CoV-2 (COVID-19) . This test is performed using the [employer]. . . Limitations: A negative result does not rule out respiratory pathogen infection below the sensitivity limit of the assay. The sensitivity depends on pathogen and sample type. This assay cannot reliably distinguish between Rhinovirus and Enterovirus due to genetic similarities between the viruses. .   Admission on 02/10/2023, Discharged on 02/10/2023  Component Date Value Ref Range Status   HIV Screen 4th Generation wRfx 02/10/2023 Non Reactive  Non Reactive Final   Performed at [facility], [address] [place], [place] [address]   RPR Ser Ql 02/10/2023 NON REACTIVE   NON REACTIVE Final   Performed at [facility], [address] [place], [place] [address]   Spotted Fever Group IgG 02/10/2023 <1:64  Neg:<1:64 Final   Comment: (NOTE) This test was developed and its performance characteristics determined by [facility]. It has not been cleared or approved by the Food and Drug Administration.    Spotted Fever Group IgM 02/10/2023 <1:64  Neg:<1:64 Final   Comment: (NOTE) This test was developed and its performance characteristics determined by [facility]. It has not been cleared or approved by the Food and Drug Administration.    Result Comment 02/10/2023 Comment   Final   Comment: (NOTE) Spotted Fever Group IgG serum endpoint titer of >=1:64 is suggestive of infection at an unknown time and may be a sign of either past infection or early response to a recent infection. Spotted Fever Group IgM titer of >=1:64 is regarded as probable evidence of recent or ongoing infection. A four-fold or greater increase in titer between two serum samples drawn 1-2 weeks apart and tested in parallel is the best serologic indicator of a  recent rickettsial infection. Performed At: [facility] [address] [place], [place] [id number] Jolene Schimke MD WU:[id number]    Sodium 02/10/2023 140  135 - 145 mmol/L Final   Potassium 02/10/2023 3.6  3.5 - 5.1 mmol/L Final   Chloride 02/10/2023 104  98 - 111 mmol/L Final   CO2 02/10/2023 26  22 - 32 mmol/L Final   Glucose, Bld 02/10/2023 118 (H)  70 - 99 mg/dL Final   Glucose reference range applies only to samples taken after fasting for at least 8 hours.   BUN 02/10/2023 11  6 - 20 mg/dL Final   Creatinine, Ser 02/10/2023 0.73  0.61 - 1.24 mg/dL Final   Calcium 95/62/1308 8.9  8.9 - 10.3 mg/dL Final   Total Protein 65/78/4696 6.8  6.5 - 8.1 g/dL Final   Albumin 29/52/8413 4.2  3.5 - 5.0 g/dL Final   AST 24/40/1027 16  15 - 41 U/L Final   ALT 02/10/2023 8  0 - 44 U/L Final   Alkaline Phosphatase 02/10/2023 76   38 - 126 U/L Final   Total Bilirubin 02/10/2023 0.9  0.3 - 1.2 mg/dL Final   GFR, Estimated 02/10/2023 >60  >60 mL/min Final   Comment: (NOTE) Calculated using the CKD-EPI Creatinine Equation (2021)    Anion gap 02/10/2023 10  5 - 15 Final   Performed at [facility], [address], [place], [place] [address]   WBC 02/10/2023 8.2  4.0 - 10.5 K/uL Final   RBC 02/10/2023 5.45  4.22 - 5.81 MIL/uL Final   Hemoglobin 02/10/2023 16.6  13.0 - 17.0 g/dL Final   HCT 44/11/4740 47.9  39.0 - 52.0 % Final   MCV 02/10/2023 87.9  80.0 - 100.0 fL Final   MCH 02/10/2023 30.5  26.0 - 34.0 pg Final   MCHC 02/10/2023 34.7  30.0 - 36.0 g/dL Final   RDW 59/56/3875 13.1  11.5 - 15.5 % Final   Platelets 02/10/2023 228  150 - 400 K/uL Final   nRBC 02/10/2023 0.0  0.0 - 0.2 % Final   Neutrophils Relative % 02/10/2023 64  % Final   Neutro Abs 02/10/2023 5.3  1.7 - 7.7 K/uL Final   Lymphocytes Relative 02/10/2023 23  % Final   Lymphs Abs 02/10/2023 1.9  0.7 - 4.0 K/uL Final   Monocytes Relative 02/10/2023 9  % Final   Monocytes Absolute 02/10/2023 0.7  0.1 - 1.0 K/uL Final   Eosinophils Relative 02/10/2023 3  % Final   Eosinophils Absolute 02/10/2023 0.2  0.0 - 0.5 K/uL Final   Basophils Relative 02/10/2023 1  % Final   Basophils Absolute 02/10/2023 0.1  0.0 - 0.1 K/uL Final   Immature Granulocytes 02/10/2023 0  % Final   Abs Immature Granulocytes 02/10/2023 0.02  0.00 - 0.07 K/uL Final   Performed at [facility], [address], [place], [place] [address]  Admission on 02/05/2023, Discharged on 02/05/2023  Component Date Value Ref Range Status   Sodium 02/05/2023 139  135 - 145 mmol/L Final   Potassium 02/05/2023 3.6  3.5 - 5.1 mmol/L Final   Chloride 02/05/2023 102  98 - 111 mmol/L Final   CO2 02/05/2023 29  22 - 32 mmol/L Final   Glucose, Bld 02/05/2023 93  70 - 99 mg/dL Final   Glucose reference range applies only to samples taken after fasting for at least 8  hours.   BUN 02/05/2023 11  6 - 20 mg/dL Final   Creatinine, Ser 02/05/2023  0.89  0.61 - 1.24 mg/dL Final   Calcium 16/10/930 8.9  8.9 - 10.3 mg/dL Final   GFR, Estimated 02/05/2023 >60  >60 mL/min Final   Comment: (NOTE) Calculated using the CKD-EPI Creatinine Equation (2021)    Anion gap 02/05/2023 8  5 - 15 Final   Performed at [facility], [address] [place], [place] [address]   WBC 02/05/2023 8.3  4.0 - 10.5 K/uL Final   RBC 02/05/2023 5.55  4.22 - 5.81 MIL/uL Final   Hemoglobin 02/05/2023 17.0  13.0 - 17.0 g/dL Final   HCT 22/11/5425 49.4  39.0 - 52.0 % Final   MCV 02/05/2023 89.0  80.0 - 100.0 fL Final   MCH 02/05/2023 30.6  26.0 - 34.0 pg Final   MCHC 02/05/2023 34.4  30.0 - 36.0 g/dL Final   RDW 03/25/7627 13.0  11.5 - 15.5 % Final   Platelets 02/05/2023 277  150 - 400 K/uL Final   nRBC 02/05/2023 0.0  0.0 - 0.2 % Final   Neutrophils Relative % 02/05/2023 60  % Final   Neutro Abs 02/05/2023 5.0  1.7 - 7.7 K/uL Final   Lymphocytes Relative 02/05/2023 29  % Final   Lymphs Abs 02/05/2023 2.4  0.7 - 4.0 K/uL Final   Monocytes Relative 02/05/2023 8  % Final   Monocytes Absolute 02/05/2023 0.7  0.1 - 1.0 K/uL Final   Eosinophils Relative 02/05/2023 2  % Final   Eosinophils Absolute 02/05/2023 0.2  0.0 - 0.5 K/uL Final   Basophils Relative 02/05/2023 1  % Final   Basophils Absolute 02/05/2023 0.0  0.0 - 0.1 K/uL Final   Immature Granulocytes 02/05/2023 0  % Final   Abs Immature Granulocytes 02/05/2023 0.02  0.00 - 0.07 K/uL Final   Performed at [facility], [address], [place], [place] [address]    Allergies: Patient has no known allergies.  Medications:  Facility Ordered Medications  Medication   acetaminophen (TYLENOL) tablet 650 mg   alum & mag hydroxide-simeth (MAALOX/MYLANTA) 200-200-20 MG/5ML suspension 30 mL   magnesium hydroxide (MILK OF MAGNESIA) suspension 30 mL   hydrOXYzine (ATARAX) tablet 25 mg   traZODone  (DESYREL) tablet 50 mg   clonazePAM (KLONOPIN) tablet 0.5 mg   PTA Medications  Medication Sig   clonazePAM (KLONOPIN) 0.5 MG tablet Take 0.5 mg by mouth 3 (three) times daily as needed for anxiety.   sertraline (ZOLOFT) 25 MG tablet Take 25 mg by mouth daily. (Patient not taking: Reported on 04/23/2023)    Screenings    Flowsheet Row Most Recent Value  CIWA-Ar Total 2  COWS Total Score 1       Medical Decision Making  Labs ordered: CBC, UDS, UA, CMP, EtOH    Recommendations  Based on my evaluation the patient does not appear to have an emergency medical condition.  Gaylyn Lambert has a PMHX of MDD, Anxiety and Polysubstance abuse ( EtOH, THC, Cocaine and Klonipin) who presents for worsening depression and SI. Given the patient worsening depression symptoms and inability to contract to safety. The patient will remain on the observation unit for reassessment in the morning. We discussed the option of staying overnight and reassesment in the morning. Also discussed the option of inpatient hospitalization for worsening symptoms. Discussed the option of possibly placement in [facility] for detox. The patient voluntarily wants to stay overnight and consider his options in the morning.    Depression w/ SI  - Unable to contract for safety, continue  to monitor  - Holding home sertraline 25 mg for now, patient not taking  - Consider restarting medication or an alternative   Anxiety -Home klonipin 05 mg TID prn   Extensive Polysubstance Abuse EtOH, THC, Cocaine, Kratom - Klonipin 0.5 mg TID Prn  - Gabapentin 300 mg TID  - CIWA assessments    Peterson Ao, MD 04/23/23  1:36 PM
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
## 2023-04-23 NOTE — ED Notes (Signed)
Pt arrived on unit, endorses passive SI with plan to walk into traffic, reports stressors include homelessness and substance abuse problems, contracts for safety on unit, belongings secured in locker, pt dressed out in scrubs, pt has a rash on arms, chest and abdomen that he reports he has had since may of this year, per ER documentation pt was treated for it in May of this year, denies any pain with rash, denies itching, Dr. Birdena Crandall notified of rash. Pt in shower at this time, oriented pt to unit.

## 2023-04-23 NOTE — ED Notes (Signed)
Pt sleeping in no acute distress. RR even and unlabored. Environment secured. Will continue to monitor for safety. 

## 2023-04-23 NOTE — Progress Notes (Signed)
   04/23/23 1003  [facility] Triage Screening (Walk-ins at [facility] only)  How Did You Hear About [place]? Family/Friend  What Is the Reason for Your Visit/Call Today? Pt presents to [facility] voluntarily accompanied by his mother seeking substance use treatment. Pt reports SI for the past 3 weeks, and reports he thought of a plan earlier this week (jumping in front of a car)but has no intentions to follow through.Pt reports hx of anxiety and Bipolar disorder. Pt reports he is prescribed medications by Dr.Kaur.Pt reports past suicide attempt in 2019  by cutting but stopped himself halfway through cutting his arm, he was not hospitalized at that time. Pt reports he was hospitalized about 2 years ago for alcohol abuse. Pt reports his stressors are his drug addiction, lack of support, probation and homelessness.Pt reports he has been abusing his Klonopin, using cocaine and alcohol. Last use was yesterday, 1 gram of cocaine, approximately twenty-five 12oz beers, and unknown amount of klonopin.Pt denies HI and AVH.  How Long Has This Been Causing You Problems? 1 wk - 1 month  Have You Recently Had Any Thoughts About Hurting Yourself? Yes  How long ago did you have thoughts about hurting yourself? this week  Are You Planning to Commit Suicide/Harm Yourself At This time? No  Have you Recently Had Thoughts About Hurting Someone [name]? No  Are You Planning To Harm Someone At This Time? No  Are you currently experiencing any auditory, visual or other hallucinations? No  Have You Used Any Alcohol or Drugs in the Past 24 Hours? Yes  How long ago did you use Drugs or Alcohol? yesterday  What Did You Use and How Much? 1 gram of cocaine, approximately twenty-five 12oz beers, and unknown amount of klonopin.  Do you have any current medical co-morbidities that require immediate attention? No  Clinician description of patient physical appearance/behavior: disheveled  What Do You Feel Would Help You the Most Today? Treatment for Depression  or other mood problem  If access to [facility] Urgent Care was not available, would you have sought care in the Emergency Department? No  Determination of Need Urgent (48 hours)  Options For Referral Facility-Based Crisis

## 2023-04-23 NOTE — ED Notes (Addendum)
Blood glucose rechecked, currently 125. Pt is calm and cooperative. Denies any needs at this moment. Will continue to monitor for safety.

## 2023-04-23 NOTE — ED Notes (Signed)
Pt sleeping@this time. Breathing even and unlabored. Will continue to monitor for safety 

## 2023-04-23 NOTE — ED Notes (Signed)
Pt sleeping at present, no distress noted.  Monitoring for safety. 

## 2023-04-24 ENCOUNTER — Other Ambulatory Visit (HOSPITAL_COMMUNITY)
Admission: EM | Admit: 2023-04-24 | Discharge: 2023-04-30 | Disposition: A | Discharge status: Home / Self Care | Payer: 59 | Attending: Psychiatry | Admitting: Psychiatry

## 2023-04-24 ENCOUNTER — Encounter (HOSPITAL_COMMUNITY): Payer: Self-pay

## 2023-04-24 DIAGNOSIS — F321 Major depressive disorder, single episode, moderate: Secondary | ICD-10-CM

## 2023-04-24 DIAGNOSIS — F1020 Alcohol dependence, uncomplicated: Secondary | ICD-10-CM

## 2023-04-24 DIAGNOSIS — F1010 Alcohol abuse, uncomplicated: Secondary | ICD-10-CM | POA: Diagnosis not present

## 2023-04-24 DIAGNOSIS — F1320 Sedative, hypnotic or anxiolytic dependence, uncomplicated: Secondary | ICD-10-CM

## 2023-04-24 DIAGNOSIS — R45851 Suicidal ideations: Secondary | ICD-10-CM | POA: Diagnosis not present

## 2023-04-24 DIAGNOSIS — F1290 Cannabis use, unspecified, uncomplicated: Secondary | ICD-10-CM

## 2023-04-24 DIAGNOSIS — F1410 Cocaine abuse, uncomplicated: Secondary | ICD-10-CM | POA: Diagnosis not present

## 2023-04-24 DIAGNOSIS — F1910 Other psychoactive substance abuse, uncomplicated: Secondary | ICD-10-CM | POA: Diagnosis not present

## 2023-04-24 DIAGNOSIS — F1994 Other psychoactive substance use, unspecified with psychoactive substance-induced mood disorder: Secondary | ICD-10-CM

## 2023-04-24 DIAGNOSIS — F1310 Sedative, hypnotic or anxiolytic abuse, uncomplicated: Secondary | ICD-10-CM

## 2023-04-24 DIAGNOSIS — F17200 Nicotine dependence, unspecified, uncomplicated: Secondary | ICD-10-CM

## 2023-04-24 DIAGNOSIS — F411 Generalized anxiety disorder: Secondary | ICD-10-CM

## 2023-04-24 DIAGNOSIS — F1120 Opioid dependence, uncomplicated: Secondary | ICD-10-CM

## 2023-04-24 DIAGNOSIS — F13930 Sedative, hypnotic or anxiolytic use, unspecified with withdrawal, uncomplicated: Secondary | ICD-10-CM

## 2023-04-24 MED ORDER — CLONAZEPAM 0.5 MG PO TABS: 0.5000 mg | ORAL_TABLET | Freq: Two times a day (BID) | ORAL | Status: DC

## 2023-04-24 MED ORDER — CHLORDIAZEPOXIDE HCL 25 MG PO CAPS
25.0000 mg | ORAL_CAPSULE | Freq: Four times a day (QID) | ORAL | Status: AC
  Administered 2023-04-24 – 2023-04-25 (×6): 25 mg via ORAL
  Filled 2023-04-24 (×6): qty 1

## 2023-04-24 MED ORDER — GABAPENTIN 300 MG PO CAPS: 300.0000 mg | ORAL_CAPSULE | Freq: Three times a day (TID) | ORAL | Status: DC

## 2023-04-24 MED ORDER — HYDROXYZINE HCL 25 MG PO TABS
25.0000 mg | ORAL_TABLET | Freq: Three times a day (TID) | ORAL | Status: DC | PRN
  Administered 2023-04-25 – 2023-04-29 (×6): 25 mg via ORAL
  Filled 2023-04-24 (×6): qty 1

## 2023-04-24 MED ORDER — LOPERAMIDE HCL 2 MG PO CAPS: 2.0000 mg | ORAL_CAPSULE | ORAL | Status: AC | PRN

## 2023-04-24 MED ORDER — HYDROCERIN EX CREA
TOPICAL_CREAM | Freq: Two times a day (BID) | CUTANEOUS | Status: DC | PRN
  Filled 2023-04-24: qty 113

## 2023-04-24 MED ORDER — TRIAMCINOLONE 0.1 % CREAM:EUCERIN CREAM 1:1: TOPICAL_CREAM | Freq: Two times a day (BID) | CUTANEOUS | Status: DC | PRN

## 2023-04-24 MED ORDER — ADULT MULTIVITAMIN W/MINERALS CH
1.0000 | ORAL_TABLET | Freq: Every day | ORAL | Status: DC
  Administered 2023-04-25 – 2023-04-30 (×6): 1 via ORAL
  Filled 2023-04-24 (×6): qty 1

## 2023-04-24 MED ORDER — NICOTINE 21 MG/24HR TD PT24
21.0000 mg | MEDICATED_PATCH | Freq: Once | TRANSDERMAL | Status: AC
  Administered 2023-04-24: 21 mg via TRANSDERMAL
  Filled 2023-04-24: qty 1

## 2023-04-24 MED ORDER — ALUM & MAG HYDROXIDE-SIMETH 200-200-20 MG/5ML PO SUSP: 30.0000 mL | ORAL | Status: DC | PRN

## 2023-04-24 MED ORDER — CHLORDIAZEPOXIDE HCL 25 MG PO CAPS: 25.0000 mg | ORAL_CAPSULE | ORAL | Status: DC

## 2023-04-24 MED ORDER — ADULT MULTIVITAMIN W/MINERALS CH: 1.0000 | ORAL_TABLET | Freq: Every day | ORAL | Status: DC

## 2023-04-24 MED ORDER — ONDANSETRON 4 MG PO TBDP: 4.0000 mg | ORAL_TABLET | Freq: Four times a day (QID) | ORAL | Status: AC | PRN

## 2023-04-24 MED ORDER — CLONAZEPAM 0.5 MG PO TABS
0.5000 mg | ORAL_TABLET | Freq: Two times a day (BID) | ORAL | Status: DC
Start: 2023-04-24 — End: 2023-04-24

## 2023-04-24 MED ORDER — VITAMIN B-1 100 MG PO TABS: 100.0000 mg | ORAL_TABLET | Freq: Every day | ORAL | Status: DC

## 2023-04-24 MED ORDER — CHLORDIAZEPOXIDE HCL 25 MG PO CAPS: 25.0000 mg | ORAL_CAPSULE | Freq: Every day | ORAL | Status: DC

## 2023-04-24 MED ORDER — TRIAMCINOLONE ACETONIDE 0.1 % EX CREA
TOPICAL_CREAM | Freq: Two times a day (BID) | CUTANEOUS | Status: DC | PRN
  Filled 2023-04-24: qty 15

## 2023-04-24 MED ORDER — ACETAMINOPHEN 325 MG PO TABS
650.0000 mg | ORAL_TABLET | Freq: Four times a day (QID) | ORAL | Status: DC | PRN
  Administered 2023-04-25 – 2023-04-27 (×2): 650 mg via ORAL
  Filled 2023-04-24 (×2): qty 2

## 2023-04-24 MED ORDER — CHLORDIAZEPOXIDE HCL 25 MG PO CAPS: 25.0000 mg | ORAL_CAPSULE | Freq: Four times a day (QID) | ORAL | Status: AC | PRN

## 2023-04-24 MED ORDER — MAGNESIUM HYDROXIDE 400 MG/5ML PO SUSP: 30.0000 mL | Freq: Every day | ORAL | Status: DC | PRN

## 2023-04-24 MED ORDER — CHLORDIAZEPOXIDE HCL 25 MG PO CAPS
25.0000 mg | ORAL_CAPSULE | Freq: Three times a day (TID) | ORAL | Status: DC
  Administered 2023-04-25: 25 mg via ORAL
  Filled 2023-04-24: qty 1

## 2023-04-24 MED ORDER — GABAPENTIN 300 MG PO CAPS
300.0000 mg | ORAL_CAPSULE | Freq: Three times a day (TID) | ORAL | Status: DC
  Administered 2023-04-24 – 2023-04-25 (×3): 300 mg via ORAL
  Filled 2023-04-24 (×3): qty 1

## 2023-04-24 MED ORDER — TRAZODONE HCL 50 MG PO TABS: 50.0000 mg | ORAL_TABLET | Freq: Every evening | ORAL | Status: DC | PRN

## 2023-04-24 MED ORDER — TRAZODONE HCL 50 MG PO TABS
50.0000 mg | ORAL_TABLET | Freq: Every evening | ORAL | Status: DC | PRN
  Administered 2023-04-24 – 2023-04-29 (×6): 50 mg via ORAL
  Filled 2023-04-24 (×6): qty 1

## 2023-04-24 MED ORDER — HYDROXYZINE HCL 25 MG PO TABS: 25.0000 mg | ORAL_TABLET | Freq: Three times a day (TID) | ORAL | Status: DC | PRN

## 2023-04-24 MED ORDER — DIPHENHYDRAMINE-ZINC ACETATE 2-0.1 % EX CREA: TOPICAL_CREAM | Freq: Every day | CUTANEOUS | Status: DC | PRN

## 2023-04-24 MED ORDER — THIAMINE HCL 100 MG/ML IJ SOLN: 100.0000 mg | Freq: Once | INTRAMUSCULAR | Status: DC

## 2023-04-24 MED ORDER — CLONAZEPAM 0.5 MG PO TABS
0.5000 mg | ORAL_TABLET | Freq: Two times a day (BID) | ORAL | Status: DC
  Administered 2023-04-24: 0.5 mg via ORAL
  Filled 2023-04-24: qty 1

## 2023-04-24 NOTE — ED Notes (Signed)
Patient  sleeping in no acute stress. RR even and unlabored .Environment secured .Will continue to monitor for safely. 

## 2023-04-24 NOTE — Discharge Instructions (Signed)
Follow-up recommendations:  Activity:  Normal, as tolerated Diet:  Per PCP recommendation Detox in [facility]  Patient is instructed prior to discharge to: Take all medications as prescribed by her mental healthcare provider. Report any adverse effects and/or reactions from the medicines to her outpatient provider promptly. Patient has been instructed & cautioned: To not engage in alcohol and or illegal drug use while on prescription medicines.  In the event of worsening symptoms, patient is instructed to call the crisis hotline at 988, 911 and or go to the nearest ED for appropriate evaluation and treatment of symptoms. To follow-up with her primary care provider for your other medical issues, concerns and or health care needs.

## 2023-04-24 NOTE — ED Notes (Signed)
Rn called report to [facility] nurse. She states that patient can come over now

## 2023-04-24 NOTE — Progress Notes (Signed)
Pt's CIWA was 7 

## 2023-04-24 NOTE — ED Provider Notes (Addendum)
[facility]/OBS ASAP Discharge Summary  Date and Time: 04/24/2023 11:36 AM  Name: Justin May  MRN:  [id number]   Discharge Diagnoses:  Final diagnoses:  Current moderate episode of major depressive disorder, unspecified whether recurrent (HCC)  Suicidal ideation  Generalized anxiety disorder  Polysubstance abuse (HCC)  ETOH abuse  Benzodiazepine abuse, continuous (HCC)  Cocaine abuse (HCC)  Marijuana use    Subjective: Patient doesn't have any active or intent with SI today. Still feels flat and depressed about situation. No HI/AVH.  Withdrawal symptoms currently are Headache, body aches and slight tremors. Didn't ask for any klonopin last night to try and come his meds. Klonopin used to help with anxiety, but realizes its caused a lot of problems with burning bridges in his life. Eating well and mostly sleeping. Desires to detox and get off klonopin. Used to help with anxiety, but has worsened things with substance behavior and burning relationships with family. Motivated to detox and seek help at an [facility] pending discharge from [facility].   Collateral per Justin May, [phone]   Mom, updated and aware of transfer to facility based center for klonopin detox. Believes the patient would benefit from an [facility], residential facility  or other alternatives at time of discharge. Is reachable by phone and would like updates about discharge plans. Is unavailable Friday - Sunday due to work obligations. Would be available to pick up if discharged on a  Monday -Thursday. Believes Justin May could benefit from the structure of a facility and could get him back on track. She just doesn't want him to fall through the cracks again.   Stay Summary:   The patient was evaluated each day by a clinical provider to ascertain response to treatment. Improvement was noted by the patient's report of decreasing symptoms, improved sleep and appetite, affect, medication tolerance, behavior, and participation in unit  programming.  Patient was asked each day to complete a self inventory noting mood, mental status, pain, new symptoms, anxiety and concerns.  The patient's medications were managed with the following directions:  Home medications:  Sertraline 25 mg daily held, patient wasn't taking  Clonazepam 0.5 mg TID prn   Started on meds:  Gabapentin 300 mg TID  Clonazepam 0.5 mg BID  Patient responded well to medication and being in a therapeutic and supportive environment. Positive and appropriate behavior was noted and the patient was motivated for recovery. The patient worked closely with the treatment team and case manager to develop a discharge plan with appropriate goals. Coping skills, problem solving as well as relaxation therapies were also part of the unit programming.   Total Time spent with patient: 15 minutes  Past Psychiatric History: Prior hospitalization at [facility] was in August 2021 for depression and polysbustance abuse. SI attempt by cutting wrist in 2019.   Past Medical History: Rash  Family History:  N/a  Family Psychiatric History: consistent with polysubstance abuse with EtOH and schizophrenia in Maternal Uncle.   Social History: Unemployed. Homeless. Graduated from [employer].  Tobacco Cessation:  N/A, patient does not currently use tobacco products  Current Medications:  Current Facility-Administered Medications  Medication Dose Route Frequency Provider Last Rate Last Admin   acetaminophen (TYLENOL) tablet 650 mg  650 mg Oral Q6H PRN Peterson Ao, MD       alum & mag hydroxide-simeth (MAALOX/MYLANTA) 200-200-20 MG/5ML suspension 30 mL  30 mL Oral Q4H PRN Peterson Ao, MD       clonazePAM Scarlette Calico) tablet 0.5 mg  0.5 mg Oral BID Peterson Ao, MD   0.5 mg at 04/24/23 1113   folic acid (FOLVITE) tablet 1 mg  1 mg Oral Daily Peterson Ao, MD   1 mg at 04/24/23 0947   gabapentin (NEURONTIN) capsule 300 mg  300 mg Oral TID Peterson Ao, MD   300 mg at  04/24/23 8295   hydrOXYzine (ATARAX) tablet 25 mg  25 mg Oral TID PRN Peterson Ao, MD   25 mg at 04/23/23 2121   magnesium hydroxide (MILK OF MAGNESIA) suspension 30 mL  30 mL Oral Daily PRN Peterson Ao, MD       multivitamin with minerals tablet 1 tablet  1 tablet Oral Daily Peterson Ao, MD   1 tablet at 04/24/23 6213   thiamine (VITAMIN B1) tablet 100 mg  100 mg Oral Daily Peterson Ao, MD   100 mg at 04/24/23 0865   Or   thiamine (VITAMIN B1) injection 100 mg  100 mg Intravenous Daily Peterson Ao, MD       traZODone (DESYREL) tablet 50 mg  50 mg Oral QHS PRN Peterson Ao, MD   50 mg at 04/23/23 2121   Current Outpatient Medications  Medication Sig Dispense Refill   Naphazoline HCl (CLEAR EYES OP) Place 3-4 drops into both eyes 4 (four) times daily as needed (For eye irritation).     clonazePAM (KLONOPIN) 0.5 MG tablet Take 1 tablet (0.5 mg total) by mouth 2 (two) times daily.     gabapentin (NEURONTIN) 300 MG capsule Take 1 capsule (300 mg total) by mouth 3 (three) times daily.     hydrOXYzine (ATARAX) 25 MG tablet Take 1 tablet (25 mg total) by mouth 3 (three) times daily as needed for anxiety.     [START ON 04/25/2023] Multiple Vitamin (MULTIVITAMIN WITH MINERALS) TABS tablet Take 1 tablet by mouth daily.     sertraline (ZOLOFT) 25 MG tablet Take 25 mg by mouth daily. (Patient not taking: Reported on 04/23/2023)     [START ON 04/25/2023] thiamine (VITAMIN B-1) 100 MG tablet Take 1 tablet (100 mg total) by mouth daily.     traZODone (DESYREL) 50 MG tablet Take 1 tablet (50 mg total) by mouth at bedtime as needed for sleep.      PTA Medications:  PTA Medications  Medication Sig   sertraline (ZOLOFT) 25 MG tablet Take 25 mg by mouth daily. (Patient not taking: Reported on 04/23/2023)   hydrOXYzine (ATARAX) 25 MG tablet Take 1 tablet (25 mg total) by mouth 3 (three) times daily as needed for anxiety.   traZODone (DESYREL) 50 MG tablet Take 1 tablet (50 mg total) by mouth  at bedtime as needed for sleep.   clonazePAM (KLONOPIN) 0.5 MG tablet Take 1 tablet (0.5 mg total) by mouth 2 (two) times daily.   gabapentin (NEURONTIN) 300 MG capsule Take 1 capsule (300 mg total) by mouth 3 (three) times daily.   [START ON 04/25/2023] Multiple Vitamin (MULTIVITAMIN WITH MINERALS) TABS tablet Take 1 tablet by mouth daily.   [START ON 04/25/2023] thiamine (VITAMIN B-1) 100 MG tablet Take 1 tablet (100 mg total) by mouth daily.   Facility Ordered Medications  Medication   acetaminophen (TYLENOL) tablet 650 mg   alum & mag hydroxide-simeth (MAALOX/MYLANTA) 200-200-20 MG/5ML suspension 30 mL   magnesium hydroxide (MILK OF MAGNESIA) suspension 30 mL   hydrOXYzine (ATARAX) tablet 25 mg   traZODone (DESYREL) tablet 50 mg   thiamine (VITAMIN B1) tablet 100 mg   Or   thiamine (  VITAMIN B1) injection 100 mg   folic acid (FOLVITE) tablet 1 mg   multivitamin with minerals tablet 1 tablet   gabapentin (NEURONTIN) capsule 300 mg   clonazePAM (KLONOPIN) tablet 0.5 mg       10/07/2020    1:43 PM 08/04/2020    2:19 PM 01/30/2020    8:05 AM  Depression screen PHQ 2/9  Decreased Interest 0 0 0  Down, Depressed, Hopeless 0 2 0  PHQ - 2 Score 0 2 0  Altered sleeping  3   Tired, decreased energy  2   Change in appetite  3   Feeling bad or failure about yourself   3   Trouble concentrating  3   Moving slowly or fidgety/restless  3   Suicidal thoughts  0   PHQ-9 Score  19   Difficult doing work/chores  Extremely dIfficult     Flowsheet Row ED from 04/23/2023 in [facility] ED from 02/17/2023 in [facility] Emergency Department at [facility] ED from 02/10/2023 in [facility] Department at [facility]  C-SSRS RISK CATEGORY Moderate Risk No Risk No Risk       Musculoskeletal  Strength & Muscle Tone: within normal limits Gait & Station: normal Patient leans: N/A  Psychiatric Specialty Exam  Presentation  General Appearance:   Appropriate for Environment; Casual  Eye Contact: Good  Speech: Clear and Coherent  Speech Volume: Normal  Handedness: Right   Mood and Affect  Mood: Anxious; Depressed  Affect: Flat; Congruent   Thought Process  Thought Processes: Coherent; Goal Directed; Linear  Descriptions of Associations:Intact  Orientation:Full (Time, Place and Person)  Thought Content:Logical  Diagnosis of Schizophrenia or Schizoaffective disorder in past: No    Hallucinations:Hallucinations: None  Ideas of Reference:None  Suicidal Thoughts:Suicidal Thoughts: Yes, Passive SI Passive Intent and/or Plan: Without Means to Carry Out; Without Plan; Without Intent  Homicidal Thoughts:Homicidal Thoughts: No   Sensorium  Memory: Immediate Good; Recent Fair  Judgment: Fair  Insight: Good   Executive Functions  Concentration:Good  Attention Span: Good  Recall: Fair  Fund of Knowledge: Good  Language: Good   Psychomotor Activity  Psychomotor Activity: Psychomotor Activity: Normal   Assets  Assets: Desire for Improvement; Financial Resources/Insurance; Social Support   Sleep  Sleep: Sleep: Good Number of Hours of Sleep: 8   Nutritional Assessment (For OBS and [facility] admissions only) Has the patient had a weight loss or gain of 10 pounds or more in the last 3 months?: No Has the patient had a decrease in food intake/or appetite?: No Does the patient have dental problems?: No Does the patient have eating habits or behaviors that may be indicators of an eating disorder including binging or inducing vomiting?: No Has the patient recently lost weight without trying?: 0 Has the patient been eating poorly because of a decreased appetite?: 0 Malnutrition Screening Tool Score: 0    Physical Exam  Physical Exam HENT:     Head: Normocephalic.  Eyes:     Conjunctiva/sclera: Conjunctivae normal.  Cardiovascular:     Rate and Rhythm: Normal rate.  Neurological:      Mental Status: He is alert and oriented to person, place, and time.    Review of Systems  Constitutional:  Positive for malaise/fatigue. Negative for chills and fever.  HENT:  Negative for ear discharge.   Eyes:  Negative for blurred vision.  Respiratory:  Negative for cough and shortness of breath.   Cardiovascular:  Negative for chest  pain.  Gastrointestinal:  Negative for diarrhea, nausea and vomiting.  Musculoskeletal:  Positive for myalgias.  Neurological:  Positive for tremors. Negative for dizziness, seizures and headaches.  Psychiatric/Behavioral:  Positive for depression and substance abuse. Negative for hallucinations. The patient is nervous/anxious.    Blood pressure 132/80, pulse 61, temperature 98.5 F (36.9 C), temperature source Oral, resp. rate 16, SpO2 100%. There is no height or weight on file to calculate BMI.  Demographic Factors:  Male, Adolescent or young adult, Caucasian, Low socioeconomic status, and Unemployed  Loss Factors: Legal issues and Financial problems/change in socioeconomic status  Historical Factors: Prior suicide attempts, Family history of suicide, and Family history of mental illness or substance abuse  Risk Reduction Factors:   Positive social support  Continued Clinical Symptoms:  Severe Anxiety and/or Agitation Depression:   Hopelessness Insomnia Alcohol/Substance Abuse/Dependencies Unstable or Poor Therapeutic Relationship  Cognitive Features That Contribute To Risk:  None    Suicide Risk:  Moderate:  Frequent suicidal ideation with limited intensity, and duration, some specificity in terms of plans, no associated intent, good self-control, limited dysphoria/symptomatology, some risk factors present, and identifiable protective factors, including available and accessible social support.  Plan Of Care/Follow-up recommendations:  Justin May has a PMHX of MDD, Anxiety and Polysubstance abuse ( EtOH, THC, Cocaine and Klonipin)  who presented for worsening depression and SI. The patient denies any intent or plan to act on suicidal ideations, HI or AVH. Is motivated to detox in the [facility] to wean off his klonopin. He ultimately wants to come off the klonopin and manage his anxiety in other ways. We discussed the importance of gradually tapering off klonopin given the patient's chronic history, misuse and EtOH use in order to decrease the likelihood of poor withdrawal symptoms such as seizures/Dts. Patient agreeable to taper off gradually in the [facility]. Also open to potential placement in an [facility] or alternative facility to help with polysubstance use. The patient will be discharged from the observation unit and seek detox in the [facility].    Depression - Patient w/ passive SI, no longer has plan or intent continue to monitor  - Holding home sertraline 25 mg for now, patient not taking  - Consider restarting medication or an alternative at [facility]   Anxiety -Discontinued home klonipin 0.5 mg TID prn, didn't request last night - - Klonopin 0.5 mg scheduled TID to gradually wean him off - Hydroxyzine 25 mg TID prn x 1 overnight    Extensive Polysubstance Abuse EtOH, THC, Cocaine, Kratom - Klonipin 0.5 mg TID Prn Discontinued - Gabapentin 300 mg TID for withdrawal symptoms  - Thiamin  - Multivitamin   Disposition: Facility Based Crisis Center  Peterson Ao, MD 04/24/2023, 11:36 AM

## 2023-04-24 NOTE — ED Notes (Signed)
Patient observed/assessed in room in bed appearing in no immediate distress resting peacefully. Q15 minute checks continued by MHT and nursing staff. Will continue to monitor and support. 

## 2023-04-24 NOTE — ED Provider Notes (Signed)
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 Lucianne Muss and social worker Alona Bene. Patient reports using multiple substances. He reports daily use of alcohol (tequila pint or 25 12 oz beers) which started when he was 27 yo. His last use was 7/21. He reports daily use of Klonopin, at least 4 g daily, his last use was 7/20 and he is Rx'ed 0.5 TID PRN from Dr. Evelene Croon (per PDMP, last filled 04/13/23) - which started 5 years ago. He also reports daily tobacco use - 1.5ppd since he was 27 yo and daily Kratom use, twice a day, 8-9g daily. He reports occasional cocaine use "whenever someone has it" and history of using meth (I have done before) and heroin (once). He reports his longest period of sobriety was when he was in jail for 4 months and the most recent period of sobriety was in January when he was sober for 1-1.5 months. He reports he relapses because he hangs out with the wrong people. Per chart review, he has a seizure related to ativan withdrawal in 2018 and was placed on keppra 500 BID. He did not continue to take the Keppra and reported that his last seizure was over a year ago. He reports stressors of homelessness "being in a violent environment, lack of love" and isolating himself when he starts to use more substances. He reports that his goal is to get into an [employer].    Patient has a reported psychiatric history of bipolar and anxiety. He reports the bipolar diagnosis he "once saw in a chart" but he denies any formal diagnosis. He reports current depressive symptoms of  hopelessness, depressed mood, passive suicidal ideation, decreased sleep, appetite, concentration. He reports history of periods of increased energy but tends to coincide with his periods of substance use or initially when he is sober. No history of manic or hypomanic episodes. He has a history of suicide attempt in 2019 when he cut his arm and prior [facility] hospitalizations in 2018 and 2021 for his alcohol use disorder. He has prior medication trials of buspar, ativan, remeron, adderall, lexapro, zoloft, and gabapentin. Current home medications are zoloft 25 and klonopin 0.5 TID. He reports current withdrawal symptoms of "feeling like pulling out of skin, anger, nausea." Denies current SI/HI/AVH.   He is single with no children, unemployed, and homeless. He reports he was about to get a job the day before coming in but then he used coke. He reports his mom lives in [place]. He reports legal charges of being on probation in [place] until 2025 for assault and attempted strangulation. He reports upcoming court date in August 11/12 for trespassing and hit and run in [place].    PHQ 2-9:  Flowsheet Row Office Visit from 08/04/2020 in Primary Care at [facility] Visit from 05/08/2018 in Primary Care at [facility] Visit from 07/30/2017 in Primary Care at [facility]  Thoughts that you would be better off dead, or of hurting yourself in some way Not at all Not at all Not at all  PHQ-9 Total Score 19 3 16        Flowsheet Row ED  from 04/23/2023 in [facility] ED from 02/17/2023 in [facility] Emergency Department at [facility] ED from 02/10/2023 in [facility] Emergency Department at [facility]  C-SSRS RISK CATEGORY Moderate Risk No Risk No Risk         Total Time spent with patient: 30 minutes  Musculoskeletal  Strength & Muscle Tone: within normal limits Gait & Station: normal Patient leans: N/A  Psychiatric Specialty Exam  Presentation General  Appearance:  Appropriate for Environment; Casual  Eye Contact: Good  Speech: Clear and Coherent  Speech Volume: Normal  Handedness: Right   Mood and Affect  Mood: Anxious; Depressed  Affect: Flat; Congruent   Thought Process  Thought Processes: Coherent; Goal Directed; Linear  Descriptions of Associations:Intact  Orientation:Full (Time, Place and Person)  Thought Content:Logical  Diagnosis of Schizophrenia or Schizoaffective disorder in past: No   Hallucinations:Hallucinations: None  Ideas of Reference:None  Suicidal Thoughts:Suicidal Thoughts: Yes, Passive SI Passive Intent and/or Plan: Without Means to Carry Out; Without Plan; Without Intent  Homicidal Thoughts:Homicidal Thoughts: No   Sensorium  Memory: Immediate Good; Recent Fair  Judgment: Fair  Insight: Good   Executive Functions  Concentration: Good  Attention Span: Good  Recall: Fair  Fund of Knowledge: Good  Language: Good   Psychomotor Activity  Psychomotor Activity: Psychomotor Activity: Normal   Assets  Assets: Desire for Improvement; Financial Resources/Insurance; Social Support   Sleep  Sleep: Sleep: Good Number of Hours of Sleep: 8   Nutritional Assessment (For OBS and FBC admissions only) Has the patient had a weight loss or gain of 10 pounds or more in the last 3 months?: No Has the patient had a decrease in food intake/or appetite?: No Does the patient have dental problems?: No Does the patient have eating habits or behaviors that may be indicators of an eating disorder including binging or inducing vomiting?: No Has the patient recently lost weight without trying?: 0 Has the patient been eating poorly because of a decreased appetite?: 0 Malnutrition Screening Tool Score: 0    Physical Exam Constitutional:      Appearance: Normal appearance.  HENT:     Head: Normocephalic and atraumatic.  Eyes:     Extraocular Movements: Extraocular movements  intact.  Pulmonary:     Effort: Pulmonary effort is normal.  Musculoskeletal:        General: Normal range of motion.     Cervical back: Normal range of motion.  Skin:    General: Skin is warm and dry.  Neurological:     General: No focal deficit present.     Mental Status: He is alert.    Review of Systems  Respiratory:  Negative for cough.   Cardiovascular:  Negative for chest pain.  Gastrointestinal:  Positive for nausea.  Neurological:  Positive for tremors. Negative for headaches.  Psychiatric/Behavioral:  The patient is nervous/anxious.     There were no vitals taken for this visit. There is no height or weight on file to calculate BMI.  Past Psychiatric History:  reported psychiatric history of bipolar and anxiety. Prior hospitalization at [facility] was in August 2021 for depression and polysbustance abuse. SI attempt by cutting wrist in 2019   He has prior medication trials of buspar 5 TID, ativan 0.5 BID PRN, remeron 30, adderall, lexapro 5, zoloft 50, and gabapentin 200 TID. Current home medications are zoloft 25 and klonopin 0.5 TID.  Is the patient at risk to self? Yes  Has the patient been a  risk to self in the past 6 months? Yes .    Has the patient been a risk to self within the distant past? Yes   Is the patient a risk to others? Yes   Has the patient been a risk to others in the past 6 months? Yes   Has the patient been a risk to others within the distant past? Yes   Past Medical History: rash (bx suspect psoriasis) Family History: polysubstance abuse with EtOH and schizophrenia in Maternal Uncle.  Social History: Unemployed. Homeless. Not welcome back in home per mom. Graduated [employer]. Single with no children. He reports he was about to get a job the day before coming in but then he used coke. He reports his mom lives in [place]. He reports legal charges of being on probation in [place] until 2025 for assault and attempted strangulation. He reports upcoming court  date in August 11/12 for trespassing and hit and run in [employer].   Last Labs:  Admission on 04/23/2023, Discharged on 04/24/2023  Component Date Value Ref Range Status   WBC 04/23/2023 5.8  4.0 - 10.5 K/uL Final   RBC 04/23/2023 5.53  4.22 - 5.81 MIL/uL Final   Hemoglobin 04/23/2023 17.2 (H)  13.0 - 17.0 g/dL Final   HCT 60/45/4098 51.2  39.0 - 52.0 % Final   MCV 04/23/2023 92.6  80.0 - 100.0 fL Final   MCH 04/23/2023 31.1  26.0 - 34.0 pg Final   MCHC 04/23/2023 33.6  30.0 - 36.0 g/dL Final   RDW 11/91/4782 13.5  11.5 - 15.5 % Final   Platelets 04/23/2023 287  150 - 400 K/uL Final   nRBC 04/23/2023 0.0  0.0 - 0.2 % Final   Neutrophils Relative % 04/23/2023 56  % Final   Neutro Abs 04/23/2023 3.2  1.7 - 7.7 K/uL Final   Lymphocytes Relative 04/23/2023 26  % Final   Lymphs Abs 04/23/2023 1.5  0.7 - 4.0 K/uL Final   Monocytes Relative 04/23/2023 11  % Final   Monocytes Absolute 04/23/2023 0.7  0.1 - 1.0 K/uL Final   Eosinophils Relative 04/23/2023 6  % Final   Eosinophils Absolute 04/23/2023 0.3  0.0 - 0.5 K/uL Final   Basophils Relative 04/23/2023 1  % Final   Basophils Absolute 04/23/2023 0.1  0.0 - 0.1 K/uL Final   Immature Granulocytes 04/23/2023 0  % Final   Abs Immature Granulocytes 04/23/2023 0.02  0.00 - 0.07 K/uL Final   Performed at [facility], [address], [place], [place] [address]   Sodium 04/23/2023 143  135 - 145 mmol/L Final   Potassium 04/23/2023 4.5  3.5 - 5.1 mmol/L Final   Chloride 04/23/2023 103  98 - 111 mmol/L Final   CO2 04/23/2023 27  22 - 32 mmol/L Final   Glucose, Bld 04/23/2023 40 (LL)  70 - 99 mg/dL Final   Comment: CRITICAL RESULT CALLED TO, READ BACK BY AND VERIFIED WITH N.JEFFERIES RN [id number] M.ALAMANO Glucose reference range applies only to samples taken after fasting for at least 8 hours.    BUN 04/23/2023 7  6 - 20 mg/dL Final   Creatinine, Ser 04/23/2023 0.81  0.61 - 1.24 mg/dL Final   Calcium 84/69/6295 9.8  8.9 - 10.3  mg/dL Final   Total Protein 28/41/3244 7.8  6.5 - 8.1 g/dL Final   Albumin 10/04/7251 5.1 (H)  3.5 - 5.0 g/dL Final   AST 66/44/0347 35  15 - 41 U/L Final  ALT 04/23/2023 26  0 - 44 U/L Final   Alkaline Phosphatase 04/23/2023 92  38 - 126 U/L Final   Total Bilirubin 04/23/2023 0.6  0.3 - 1.2 mg/dL Final   GFR, Estimated 04/23/2023 >60  >60 mL/min Final   Comment: (NOTE) Calculated using the CKD-EPI Creatinine Equation (2021)    Anion gap 04/23/2023 13  5 - 15 Final   Performed at [facility], [address], [place], [place] [address]   Alcohol, Ethyl (B) 04/23/2023 <10  <10 mg/dL Final   Comment: (NOTE) Lowest detectable limit for serum alcohol is 10 mg/dL.  For medical purposes only. Performed at [facility], [address], [place], [place] [address]    Color, Urine 04/23/2023 YELLOW  YELLOW Final   APPearance 04/23/2023 CLEAR  CLEAR Final   Specific Gravity, Urine 04/23/2023 1.020  1.005 - 1.030 Final   pH 04/23/2023 6.0  5.0 - 8.0 Final   Glucose, UA 04/23/2023 NEGATIVE  NEGATIVE mg/dL Final   Hgb urine dipstick 04/23/2023 NEGATIVE  NEGATIVE Final   Bilirubin Urine 04/23/2023 NEGATIVE  NEGATIVE Final   Ketones, ur 04/23/2023 NEGATIVE  NEGATIVE mg/dL Final   Protein, ur 34/74/2595 NEGATIVE  NEGATIVE mg/dL Final   Nitrite 63/87/5643 NEGATIVE  NEGATIVE Final   Leukocytes,Ua 04/23/2023 NEGATIVE  NEGATIVE Final   Performed at [facility], [address], [place], [place] [address]   POC Amphetamine UR 04/23/2023 None Detected (A)  NONE DETECTED (Cut Off Level 1000 ng/mL) Final   POC Secobarbital (BAR) 04/23/2023 None Detected  NONE DETECTED (Cut Off Level 300 ng/mL) Final   POC Buprenorphine (BUP) 04/23/2023 None Detected  NONE DETECTED (Cut Off Level 10 ng/mL) Final   POC Oxazepam (BZO) 04/23/2023 Positive (A)  NONE DETECTED (Cut Off Level 300 ng/mL) Final   POC Cocaine UR 04/23/2023 Positive (A)  NONE DETECTED (Cut Off Level 300 ng/mL) Final   POC  Methamphetamine UR 04/23/2023 Positive (A)  NONE DETECTED (Cut Off Level 1000 ng/mL) Final   POC Morphine 04/23/2023 None Detected  NONE DETECTED (Cut Off Level 300 ng/mL) Final   POC Methadone UR 04/23/2023 None Detected  NONE DETECTED (Cut Off Level 300 ng/mL) Final   POC Oxycodone UR 04/23/2023 None Detected  NONE DETECTED (Cut Off Level 100 ng/mL) Final   POC Marijuana UR 04/23/2023 Positive (A)  NONE DETECTED (Cut Off Level 50 ng/mL) Final   Glucose-Capillary 04/23/2023 125 (H)  70 - 99 mg/dL Final   Glucose reference range applies only to samples taken after fasting for at least 8 hours.  Office Visit on 02/27/2023  Component Date Value Ref Range Status   HIV 1&2 Ab, 4th Generation 02/27/2023 NON-REACTIVE  NON-REACTIVE Final   Comment: HIV-1 antigen and HIV-1/HIV-2 antibodies were not detected. There is no laboratory evidence of HIV infection. Marland Kitchen PLEASE NOTE: This information has been disclosed to you from records whose confidentiality may be protected by state law.  If your state requires such protection, then the state law prohibits you from making any further disclosure of the information without the specific written consent of the person to whom it pertains, or as otherwise permitted by law. A general authorization for the release of medical or other information is NOT sufficient for this purpose. . For additional information please refer to http://education.questdiagnostics.com/faq/FAQ106 (This link is being provided for informational/ educational purposes only.) . Marland Kitchen The performance of this assay has not been clinically validated in patients less than 59 years old. Marland Kitchen  Hep B S Ab 02/27/2023 NON-REACTIVE  NON-REACTIVE Final   Hep B Core Total Ab 02/27/2023 NON-REACTIVE  NON-REACTIVE Final   Comment: . For additional information, please refer to  http://education.questdiagn[facility].com/faq/FAQ202  (This link is being provided for informational/ educational purposes  only.) .    Hepatitis B Surface Ag 02/27/2023 NON-REACTIVE  NON-REACTIVE Final   Comment: . For additional information, please refer to  http://education.questdiagn[facility].com/faq/FAQ202  (This link is being provided for informational/ educational purposes only.) .    Hepatitis C Ab 02/27/2023 NON-REACTIVE  NON-REACTIVE Final   Comment: . HCV antibody was non-reactive. There is no laboratory  evidence of HCV infection. . In most cases, no further action is required. However, if recent HCV exposure is suspected, a test for HCV RNA (test code 32951) is suggested. . For additional information please refer to http://education.questdiagn[facility].com/faq/FAQ22v1 (This link is being provided for informational/ educational purposes only.) .    HIV 1 RNA Quant 02/27/2023 NOT DETECTED  copies/mL Final   HIV-1 RNA Quant, Log 02/27/2023 NOT DETECTED  Log copies/mL Final   Comment: REFERENCE RANGE:           NOT DETECTED  copies/mL           NOT DETECTED  Log copies/mL . This test was performed using Real-Time Polymerase Chain Reaction. . Reportable range is 20 to 10,000,000 copies/mL (1.30-7.00 Log copies/mL).    Neisseria Gonorrhea 02/27/2023 Negative   Final   Chlamydia 02/27/2023 Negative   Final   Comment 02/27/2023 Normal Reference Ranger Chlamydia - Negative   Final   Comment 02/27/2023 Normal Reference Range Neisseria Gonorrhea - Negative   Final   RPR Ser Ql 02/27/2023 NON-REACTIVE  NON-REACTIVE Final   Comment: . No laboratory evidence of syphilis. If recent exposure is suspected, submit a new sample in 2-4 weeks. Marland Kitchen    Heterophile, Mono Screen 02/27/2023 NEGATIVE  NEGATIVE Final   EBV VCA IgM 02/27/2023 <36.00  U/mL Final   Comment:       U/mL              Interpretation       ----              --------------       <36.00            Negative       36.00-43.99       Equivocal       >43.99            Positive    EBV VCA IgG 02/27/2023 76.90 (H)  U/mL Final   Comment:         U/mL             Interpretation        ----             --------------        <18.00           Negative        18.00-21.99      Equivocal        >21.99           Positive    EBV NA IgG 02/27/2023 21.60 (H)  U/mL Final   Comment:        U/mL             Interpretation        ----             --------------        <  18.00           Negative        18.00-21.99      Equivocal        >21.99           Positive    Interpretation 02/27/2023    Final   Comment: . Suggestive of a recent or past Epstein-Barr virus infection. .    Cytomegalovirus Ab-IgG 02/27/2023 7.10 (H)  U/mL Final   Comment:                            U/mL         Interpretation                            -----         --------------                            <0.60         Negative                            0.60-0.69     Equivocal                            > or = 0.70   Positive . A positive result indicates that the patient has  antibody to CMV. It does not differentiate between an active or past infection.    CMV IgM 02/27/2023 <30.00  AU/mL Final   Comment:     AU/mL                 Interpretation     -----                 --------------     <30.00                No Antibody Detected     30.00-34.99           Equivocal     > or = 35.00          Antibody Detected . Results from any one IgM assay should not be used as a sole determinant of a current or recent infection.  Because an IgM test can yield false positive results and  low level IgM antibody may persist for more than 12  months post infection, reliance on a single test result  could be misleading. Acute infection is best diagnosed  by demonstrating the conversion of IgG from negative to  positive. If an acute infection is suspected, consider  obtaining a new specimen and submit for both IgG and IgM  testing in two or more weeks. .    Adenovirus B 02/27/2023 Not Detected  Not Detected Final   Rhinovirus 02/27/2023 Not Detected  Not Detected  Final   Influenza A 02/27/2023 Not Detected  Not Detected Final   INFLUENZA A SUBTYPE H1 02/27/2023 Not Detected  Not Detected Final   INFLUENZA A SUBTYPE H3 02/27/2023 Not Detected  Not Detected Final   Influenza B 02/27/2023 Not Detected  Not Detected Final   Metapneumovirus 02/27/2023 Not Detected  Not Detected Final   Respiratory Syncytial Virus A 02/27/2023 Not Detected  Not Detected Final   Respiratory Syncytial Virus B 02/27/2023  Not Detected  Not Detected Final   HUMAN PARAINFLU VIRUS 1 02/27/2023 Not Detected  Not Detected Final   HUMAN PARAINFLU VIRUS 2 02/27/2023 Not Detected  Not Detected Final   HUMAN PARAINFLU VIRUS 3 02/27/2023 Not Detected  Not Detected Final   Comment 02/27/2023 see note   Final   Comment: THIS ASSAY WILL NOT DETECT SARS-CoV-2 (COVID-19) . This test is performed using the [employer]. . . Limitations: A negative result does not rule out respiratory pathogen infection below the sensitivity limit of the assay. The sensitivity depends on pathogen and sample type. This assay cannot reliably distinguish between Rhinovirus and Enterovirus due to genetic similarities between the viruses. .   Admission on 02/10/2023, Discharged on 02/10/2023  Component Date Value Ref Range Status   HIV Screen 4th Generation wRfx 02/10/2023 Non Reactive  Non Reactive Final   Performed at [facility], [address], [place], [place] [address]   RPR Ser Ql 02/10/2023 NON REACTIVE  NON REACTIVE Final   Performed at [facility], [address] [place], [place] [address]   Sp[facility] [employer] Group IgG 02/10/2023 <1:64  Neg:<1:64 Final   Comment: (NOTE) This test was developed and its performance characteristics determined by [facility]. It has not been cleared or approved by the Food and Drug Administration.    Sp[employer] IgM 02/10/2023 <1:64  Neg:<1:64 Final   Comment: (NOTE) This test was developed and its performance  characteristics determined by [facility]. It has not been cleared or approved by the Food and Drug Administration.    Result Comment 02/10/2023 Comment   Final   Comment: (NOTE) Sp[employer] IgG serum endpoint titer of >=1:64 is suggestive of infection at an unknown time and may be a sign of either past infection or early response to a recent infection. [facility] IgM titer of >=1:64 is regarded as probable evidence of recent or ongoing infection. A four-fold or greater increase in titer between two serum samples drawn 1-2 weeks apart and tested in parallel is the best serologic indicator of a recent rickettsial infection. Performed At: [facility] [address] [place], [place] [id number] Jolene Schimke MD [id number]    Sodium 02/10/2023 140  135 - 145 mmol/L Final   Potassium 02/10/2023 3.6  3.5 - 5.1 mmol/L Final   Chloride 02/10/2023 104  98 - 111 mmol/L Final   CO2 02/10/2023 26  22 - 32 mmol/L Final   Glucose, Bld 02/10/2023 118 (H)  70 - 99 mg/dL Final   Glucose reference range applies only to samples taken after fasting for at least 8 hours.   BUN 02/10/2023 11  6 - 20 mg/dL Final   Creatinine, Ser 02/10/2023 0.73  0.61 - 1.24 mg/dL Final   Calcium 10/04/7251 8.9  8.9 - 10.3 mg/dL Final   Total Protein 66/44/0347 6.8  6.5 - 8.1 g/dL Final   Albumin 42/59/5638 4.2  3.5 - 5.0 g/dL Final   AST 75/64/3329 16  15 - 41 U/L Final   ALT 02/10/2023 8  0 - 44 U/L Final   Alkaline Phosphatase 02/10/2023 76  38 - 126 U/L Final   Total Bilirubin 02/10/2023 0.9  0.3 - 1.2 mg/dL Final   GFR, Estimated 02/10/2023 >60  >60 mL/min Final   Comment: (NOTE) Calculated using the CKD-EPI Creatinine Equation (2021)    Anion gap 02/10/2023 10  5 - 15 Final   Performed at [facility], [address]  [place], [place] [address]   WBC 02/10/2023 8.2  4.0 - 10.5 K/uL Final   RBC 02/10/2023 5.45  4.22 - 5.81 MIL/uL Final   Hemoglobin 02/10/2023 16.6   13.0 - 17.0 g/dL Final   HCT 74/25/9563 47.9  39.0 - 52.0 % Final   MCV 02/10/2023 87.9  80.0 - 100.0 fL Final   MCH 02/10/2023 30.5  26.0 - 34.0 pg Final   MCHC 02/10/2023 34.7  30.0 - 36.0 g/dL Final   RDW 87/56/4332 13.1  11.5 - 15.5 % Final   Platelets 02/10/2023 228  150 - 400 K/uL Final   nRBC 02/10/2023 0.0  0.0 - 0.2 % Final   Neutrophils Relative % 02/10/2023 64  % Final   Neutro Abs 02/10/2023 5.3  1.7 - 7.7 K/uL Final   Lymphocytes Relative 02/10/2023 23  % Final   Lymphs Abs 02/10/2023 1.9  0.7 - 4.0 K/uL Final   Monocytes Relative 02/10/2023 9  % Final   Monocytes Absolute 02/10/2023 0.7  0.1 - 1.0 K/uL Final   Eosinophils Relative 02/10/2023 3  % Final   Eosinophils Absolute 02/10/2023 0.2  0.0 - 0.5 K/uL Final   Basophils Relative 02/10/2023 1  % Final   Basophils Absolute 02/10/2023 0.1  0.0 - 0.1 K/uL Final   Immature Granulocytes 02/10/2023 0  % Final   Abs Immature Granulocytes 02/10/2023 0.02  0.00 - 0.07 K/uL Final   Performed at [facility], [address], [place], [place] [address]  Admission on 02/05/2023, Discharged on 02/05/2023  Component Date Value Ref Range Status   Sodium 02/05/2023 139  135 - 145 mmol/L Final   Potassium 02/05/2023 3.6  3.5 - 5.1 mmol/L Final   Chloride 02/05/2023 102  98 - 111 mmol/L Final   CO2 02/05/2023 29  22 - 32 mmol/L Final   Glucose, Bld 02/05/2023 93  70 - 99 mg/dL Final   Glucose reference range applies only to samples taken after fasting for at least 8 hours.   BUN 02/05/2023 11  6 - 20 mg/dL Final   Creatinine, Ser 02/05/2023 0.89  0.61 - 1.24 mg/dL Final   Calcium 41/66/0630 8.9  8.9 - 10.3 mg/dL Final   GFR, Estimated 02/05/2023 >60  >60 mL/min Final   Comment: (NOTE) Calculated using the CKD-EPI Creatinine Equation (2021)    Anion gap 02/05/2023 8  5 - 15 Final   Performed at [facility], [address] [place], [place] [address]   WBC 02/05/2023 8.3  4.0 - 10.5 K/uL Final    RBC 02/05/2023 5.55  4.22 - 5.81 MIL/uL Final   Hemoglobin 02/05/2023 17.0  13.0 - 17.0 g/dL Final   HCT 93/23/5573 49.4  39.0 - 52.0 % Final   MCV 02/05/2023 89.0  80.0 - 100.0 fL Final   MCH 02/05/2023 30.6  26.0 - 34.0 pg Final   MCHC 02/05/2023 34.4  30.0 - 36.0 g/dL Final   RDW 22/11/5425 13.0  11.5 - 15.5 % Final   Platelets 02/05/2023 277  150 - 400 K/uL Final   nRBC 02/05/2023 0.0  0.0 - 0.2 % Final   Neutrophils Relative % 02/05/2023 60  % Final   Neutro Abs 02/05/2023 5.0  1.7 - 7.7 K/uL Final   Lymphocytes Relative 02/05/2023 29  % Final   Lymphs Abs 02/05/2023 2.4  0.7 - 4.0 K/uL Final   Monocytes Relative 02/05/2023 8  % Final   Monocytes Absolute 02/05/2023 0.7  0.1 - 1.0 K/uL Final  Eosinophils Relative 02/05/2023 2  % Final   Eosinophils Absolute 02/05/2023 0.2  0.0 - 0.5 K/uL Final   Basophils Relative 02/05/2023 1  % Final   Basophils Absolute 02/05/2023 0.0  0.0 - 0.1 K/uL Final   Immature Granulocytes 02/05/2023 0  % Final   Abs Immature Granulocytes 02/05/2023 0.02  0.00 - 0.07 K/uL Final   Performed at [facility], [address], [place], [place] [address]    Allergies: Patient has no known allergies.  Medications:  Facility Ordered Medications  Medication   acetaminophen (TYLENOL) tablet 650 mg   alum & mag hydroxide-simeth (MAALOX/MYLANTA) 200-200-20 MG/5ML suspension 30 mL   magnesium hydroxide (MILK OF MAGNESIA) suspension 30 mL   gabapentin (NEURONTIN) capsule 300 mg   hydrOXYzine (ATARAX) tablet 25 mg   [START ON 04/25/2023] multivitamin with minerals tablet 1 tablet   traZODone (DESYREL) tablet 50 mg   chlordiazePOXIDE (LIBRIUM) capsule 25 mg   loperamide (IMODIUM) capsule 2-4 mg   ondansetron (ZOFRAN-ODT) disintegrating tablet 4 mg   chlordiazePOXIDE (LIBRIUM) capsule 25 mg   Followed by   Melene Muller ON 04/25/2023] chlordiazePOXIDE (LIBRIUM) capsule 25 mg   Followed by   Melene Muller ON 04/26/2023] chlordiazePOXIDE (LIBRIUM)  capsule 25 mg   Followed by   Melene Muller ON 04/27/2023] chlordiazePOXIDE (LIBRIUM) capsule 25 mg   nicotine (NICODERM CQ - dosed in mg/24 hours) patch 21 mg   diphenhydrAMINE-zinc acetate (BENADRYL) 2-0.1 % cream   triamcinolone cream (KENALOG) 0.1 % cream   And   hydrocerin (EUCERIN) cream   PTA Medications  Medication Sig   sertraline (ZOLOFT) 25 MG tablet Take 25 mg by mouth daily. (Patient not taking: Reported on 04/23/2023)   Naphazoline HCl (CLEAR EYES OP) Place 3-4 drops into both eyes 4 (four) times daily as needed (For eye irritation).   hydrOXYzine (ATARAX) 25 MG tablet Take 1 tablet (25 mg total) by mouth 3 (three) times daily as needed for anxiety.   traZODone (DESYREL) 50 MG tablet Take 1 tablet (50 mg total) by mouth at bedtime as needed for sleep.   gabapentin (NEURONTIN) 300 MG capsule Take 1 capsule (300 mg total) by mouth 3 (three) times daily.   [START ON 04/25/2023] Multiple Vitamin (MULTIVITAMIN WITH MINERALS) TABS tablet Take 1 tablet by mouth daily.   [START ON 04/25/2023] thiamine (VITAMIN B-1) 100 MG tablet Take 1 tablet (100 mg total) by mouth daily.    Long Term Goals: Improvement in symptoms so as ready for discharge  Short Term Goals: Patient will verbalize feelings in meetings with treatment team members., Patient will attend at least of 50% of the groups daily., Pt will complete the PHQ9 on admission, day 3 and discharge., and Patient will take medications as prescribed daily.  Medical Decision Making  Trevan Messman is a 27 yo male with a past psychiatric history of anxiety, alcohol use disorder, benzodiazepine dependence, opioid dependence, tobacco use disorder, 2 prior psychiatric hospitalizations and a past medical history of a seizure disorder (not on any medications), rash (suspected psoriasis) who presents to the [facility] to detox. Patient's current depressed mood, hopelessness and passive SI consistent with substance-induced mood disorder and withdrawal from  multiple substances including methamphetamine and cocaine. He also has long history of benzodiazepine abuse, most recently using up to 4g a day. Will start him on librium taper. He appears motivated to detox though his disposition is complicated by upcoming legal charges.   Labs notable for initial glucose 40, 125. CBC Hgb 17.2. CMP Na 143.  UA wnl. Ethanol <10. 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 Rx'ed triamcinolone cream BID, cerave anti-itch cream BID  -start triamcinolone cream BID PRN  -start benadryl cream BID PRN    H/o seizure disorder Last seizure reportedly a month ago. Last saw neurology in 2020, seizure was suspected to be in setting of ativan taper and was rx'ed keppra 500 BID. Patient has not been taking -continue to monitor  -referral to PCP/neurology on discharge   Dispo: pending   Recommendations  Based on my evaluation the patient does not appear to have an emergency medical condition.  Karie Fetch, MD, PGY-2  04/24/23  12:19 PM

## 2023-04-24 NOTE — ED Notes (Signed)
Patient is with provider . Safety mantained

## 2023-04-24 NOTE — ED Notes (Signed)
Patient was admitted to [facility] from the observation unit. Patient denies SI/HI and AVH. Patient's skin check was redone with another nurse. Patient has a rash that covers his arms, abdomen, chest, back and both legs. Patient reports he has seen a dermatologist and has had the rash for about 3 months. Patient reports the rash is not contagious. Patient was oriented to the unit and given lunch. Patient is calm and cooperative. Patient reports he is looking forward to residential treatment. Patient reports his drug of choice is benzodiazepines. Patient will be monitored for safety.

## 2023-04-24 NOTE — BH IP Treatment Plan (Signed)
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)  Benzodiazepine dependence, continuous (HCC)  Substance induced mood disorder (HCC)  Tobacco use disorder     Current Medications:  Current Facility-Administered Medications  Medication Dose Route Frequency Provider Last Rate Last Admin   acetaminophen (TYLENOL) tablet 650 mg  650 mg Oral Q6H PRN Peterson Ao, MD       alum & mag hydroxide-simeth (MAALOX/MYLANTA) 200-200-20 MG/5ML suspension 30 mL  30 mL Oral Q4H PRN Peterson Ao, MD       chlordiazePOXIDE (LIBRIUM) capsule 25 mg  25 mg Oral Q6H PRN Karie Fetch, MD       chlordiazePOXIDE (LIBRIUM) capsule 25 mg  25 mg Oral QID Karie Fetch, MD   25 mg at 04/24/23 1311   Followed by   Melene Muller ON 04/25/2023] chlordiazePOXIDE (LIBRIUM) capsule 25 mg  25 mg Oral TID Karie Fetch, MD       Followed by   Melene Muller ON 04/26/2023] chlordiazePOXIDE (LIBRIUM) capsule 25 mg  25 mg Oral Elesa Hacker, MD       Followed by   Melene Muller ON 04/27/2023] chlordiazePOXIDE (LIBRIUM) capsule 25 mg  25 mg Oral Daily Karie Fetch, MD       diphenhydrAMINE-zinc acetate (BENADRYL) 2-0.1 % cream   Topical Daily PRN Karie Fetch, MD       gabapentin (NEURONTIN) capsule 300 mg  300 mg Oral TID Peterson Ao, MD   300 mg at 04/24/23 1553   triamcinolone cream (KENALOG) 0.1 % cream   Topical BID PRN Nelly Rout, MD       And   hydrocerin (EUCERIN) cream   Topical BID PRN Nelly Rout, MD       hydrOXYzine (ATARAX) tablet 25 mg  25 mg Oral TID PRN Peterson Ao, MD       loperamide (IMODIUM) capsule 2-4 mg  2-4 mg Oral PRN Karie Fetch, MD       magnesium hydroxide (MILK OF MAGNESIA) suspension 30 mL  30 mL Oral Daily PRN  Peterson Ao, MD       Melene Muller ON 04/25/2023] multivitamin with minerals tablet 1 tablet  1 tablet Oral Daily Peterson Ao, MD       nicotine (NICODERM CQ - dosed in mg/24 hours) patch 21 mg  21 mg Transdermal Once Karie Fetch, MD   21 mg at 04/24/23 1240   ondansetron (ZOFRAN-ODT) disintegrating tablet 4 mg  4 mg Oral Q6H PRN Karie Fetch, MD       traZODone (DESYREL) tablet 50 mg  50 mg Oral QHS PRN Peterson Ao, MD       Current Outpatient Medications  Medication Sig Dispense Refill   gabapentin (NEURONTIN) 300 MG capsule Take 1 capsule (300 mg total) by mouth 3 (three) times daily.     hydrOXYzine (ATARAX) 25 MG tablet Take 1 tablet (25 mg total) by mouth 3 (three) times daily as needed for anxiety.     [START ON 04/25/2023] Multiple Vitamin (MULTIVITAMIN WITH MINERALS) TABS tablet Take 1 tablet by mouth daily.     Naphazoline HCl (CLEAR EYES OP) Place 3-4 drops into both eyes 4 (four) times daily as needed (For eye irritation).     sertraline (ZOLOFT) 25 MG tablet Take 25 mg by mouth daily. (Patient not taking: Reported on 04/23/2023)     [  START ON 04/25/2023] thiamine (VITAMIN B-1) 100 MG tablet Take 1 tablet (100 mg total) by mouth daily.     traZODone (DESYREL) 50 MG tablet Take 1 tablet (50 mg total) by mouth at bedtime as needed for sleep.     PTA Medications: Prior to Admission medications   Medication Sig Start Date End Date Taking? Authorizing Provider  gabapentin (NEURONTIN) 300 MG capsule Take 1 capsule (300 mg total) by mouth 3 (three) times daily. 04/24/23   Peterson Ao, MD  hydrOXYzine (ATARAX) 25 MG tablet Take 1 tablet (25 mg total) by mouth 3 (three) times daily as needed for anxiety. 04/24/23   Peterson Ao, MD  Multiple Vitamin (MULTIVITAMIN WITH MINERALS) TABS tablet Take 1 tablet by mouth daily. 04/25/23   Peterson Ao, MD  Naphazoline HCl (CLEAR EYES OP) Place 3-4 drops into both eyes 4 (four) times daily as needed (For eye irritation).    [provider]  sertraline (ZOLOFT) 25 MG tablet Take 25 mg by mouth daily. Patient not taking: Reported on 04/23/2023 02/15/23   [provider]  thiamine (VITAMIN B-1) 100 MG tablet Take 1 tablet (100 mg total) by mouth daily. 04/25/23   Peterson Ao, MD  traZODone (DESYREL) 50 MG tablet Take 1 tablet (50 mg total) by mouth at bedtime as needed for sleep. 04/24/23   Peterson Ao, MD    Patient Stressors: Substance abuse   Other: homelessness    Patient Strengths: Ability for insight  Average or above average intelligence  Capable of independent living  Communication skills  General fund of knowledge  Motivation for treatment/growth  Supportive family/friends   Treatment Modalities: Medication Management, Group therapy, Case management,  1 to 1 session with clinician, Psychoeducation, Recreational therapy.   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 Patient will attend at least of 50% of the groups daily. Pt will complete the PHQ9 on admission, day 3 and discharge. Patient will take medications as prescribed daily.  Medication Management: Evaluate patient's response, side effects, and tolerance of medication regimen.  Therapeutic Interventions: 1 to 1 sessions, Unit Group sessions and Medication administration.  Evaluation of Outcomes: Progressing  LCSW Treatment Plan for Primary Diagnosis:  Final diagnoses:  Alcohol use disorder, severe, dependence (HCC)  Uncomplicated opioid dependence (HCC)  Sedative/hypnotic  withdrawal without complication (HCC)  Benzodiazepine dependence, continuous (HCC)  Substance induced mood disorder (HCC)  Tobacco use disorder    Long Term Goal(s): Safe transition to appropriate next level of care at discharge.  Short Term Goals: Facilitate acceptance of mental health diagnosis and concerns through verbal commitment to aftercare plan and appointments at discharge., Patient will identify one social support prior to discharge to aid in patient's recovery., Patient will attend AA/NA groups as scheduled., Identify minimum of 2 triggers associated with mental health/substance abuse issues with treatment team members., and Increase skills for wellness and recovery by attending 50% of scheduled groups.  Therapeutic Interventions: Assess for all discharge needs, 1 to 1 time with Social worker, Explore available resources and support systems, Assess for adequacy in community support network, Educate family and significant other(s) on suicide prevention, Complete  Psychosocial Assessment, Interpersonal group therapy.  Evaluation of Outcomes: Progressing   Progress in Treatment: Attending groups: Yes. Participating in groups: Yes. Taking medication as prescribed: Yes. Toleration medication: Yes. Family/Significant other contact made: No, will contact:  Patient;s mother Estanislado Spire [phone] Patient understands diagnosis: Yes. Discussing patient identified problems/goals with staff: Yes. Medical problems stabilized or resolved: Yes. Denies suicidal/homicidal ideation: Yes. Issues/concerns per patient self-inventory: Yes. Other: homelessness, probation and court issues, lack of support, substance use and need for further treatment.   New problem(s) identified: No, Describe:  other than reported on admission.   New Short Term/Long Term Goal(s): Safe transition to appropriate next level of care at discharge, Engage patient in therapeutic group addressing interpersonal concerns.  Engage patient in aftercare planning with referrals and resources, Increase ability to appropriately verbalize feelings, Facilitate acceptance of mental health diagnosis and concerns and Identify triggers associated with mental health/substance abuse issues.    Patient Goals: Patient is seeking residential placement at this time for substance use.    Discharge Plan or Barriers: LCSW will send referrals out for review for residential placement. Updates will be provided as received.   Reason for Continuation of Hospitalization: Withdrawal symptoms  Estimated Length of Stay: 3-5 days  Last 3 [place] Suicide Severity Risk Score: Flowsheet Row ED from 04/24/2023 in [facility] ED from 04/23/2023 in [facility] ED from 02/17/2023 in [facility] Emergency Department at [facility]  C-SSRS RISK CATEGORY No Risk Moderate Risk No Risk       Last PHQ 2/9 Scores:    04/24/2023    1:00 PM 10/07/2020    1:43 PM 08/04/2020    2:19 PM  Depression screen PHQ 2/9  Decreased Interest 3 0 0  Down, Depressed, Hopeless 3 0 2  PHQ - 2 Score 6 0 2  Altered sleeping 3  3  Tired, decreased energy 3  2  Change in appetite 3  3  Feeling bad or failure about yourself  3  3  Trouble concentrating 3  3  Moving slowly or fidgety/restless 0  3  Suicidal thoughts 2  0  PHQ-9 Score 23  19  Difficult doing work/chores   Extremely dIfficult    Scribe for Treatment Team: Lenny Pastel 04/24/2023 4:06 PM
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
## 2023-04-24 NOTE — ED Notes (Signed)
Patient alert and oriented x 3. Denies SI/HI/AVH. Denies intent or plan to harm self or others. Routine conducted according to faculty protocol. Encourage patient to notify staff with any needs or concerns. Patient verbalized agreement and understanding. Will continue to monitor for safety. 

## 2023-04-24 NOTE — ED Notes (Signed)
Patient observed/assessed in patient room. Patient alert and oriented x 4. Affect is flat. Patient denies pain and anxiety. He denies A/V/H. He denies having any thoughts/plan of self harm and harm towards others. Fluid and snack offered. Patient states that appetite has been good throughout the day.  Verbalizes no further complaints at this time. Will continue to monitor and support.

## 2023-04-24 NOTE — Group Note (Signed)
Group Topic: Balance in Life  Group Date: 04/24/2023 Start Time: 1130 End Time: 1203 Facilitators: Vonzell Schlatter B  Department: [facility]  Number of Participants: 2  Group Focus: daily focus Treatment Modality:  Psychoeducation Interventions utilized were problem solving Purpose: express feelings  Name: Justin May Date of Birth: 1996-01-21  MR: [id number]    Level of Participation: just got on the unit from [facility] Quality of Participation: n/a Interactions with others: n/a Mood/Affect: n/a Triggers (if applicable): n/a Cognition: n/a Progress: None Response: n/a Plan: follow-up needed  Patients Problems:  Patient Active Problem List   Diagnosis Date Noted   Benzodiazepine abuse, continuous (HCC) 04/24/2023   Rash and other nonspecific skin eruption 02/28/2023   High risk heterosexual behavior 02/28/2023   Encounter to obtain excuse from work 02/28/2023   MDD (major depressive disorder), recurrent episode, severe (HCC) 05/13/2020   Seizure disorder (HCC) 07/24/2019   Substance abuse, binge pattern (HCC) 11/19/2017   Alcohol use disorder, moderate, dependence (HCC)    Alcohol use with alcohol-induced mood disorder (HCC) 07/01/2017   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

## 2023-04-24 NOTE — ED Notes (Signed)
Pt sleeping at present, no distress noted.  Monitoring for safety. 

## 2023-04-25 DIAGNOSIS — F1020 Alcohol dependence, uncomplicated: Secondary | ICD-10-CM | POA: Diagnosis not present

## 2023-04-25 MED ORDER — ESCITALOPRAM OXALATE 10 MG PO TABS
10.0000 mg | ORAL_TABLET | Freq: Every day | ORAL | Status: DC
  Administered 2023-04-25 – 2023-04-30 (×6): 10 mg via ORAL
  Filled 2023-04-25 (×7): qty 1

## 2023-04-25 MED ORDER — NICOTINE 21 MG/24HR TD PT24
21.0000 mg | MEDICATED_PATCH | Freq: Every day | TRANSDERMAL | Status: DC
  Administered 2023-04-25 – 2023-04-30 (×6): 21 mg via TRANSDERMAL
  Filled 2023-04-25 (×6): qty 1

## 2023-04-25 MED ORDER — GABAPENTIN 300 MG PO CAPS
300.0000 mg | ORAL_CAPSULE | Freq: Once | ORAL | Status: AC
  Administered 2023-04-26: 300 mg via ORAL
  Filled 2023-04-25: qty 1

## 2023-04-25 MED ORDER — GABAPENTIN 300 MG PO CAPS: 300.0000 mg | ORAL_CAPSULE | Freq: Two times a day (BID) | ORAL | Status: DC

## 2023-04-25 MED ORDER — GABAPENTIN 300 MG PO CAPS
300.0000 mg | ORAL_CAPSULE | Freq: Two times a day (BID) | ORAL | Status: AC
  Administered 2023-04-25: 300 mg via ORAL
  Filled 2023-04-25: qty 1

## 2023-04-25 NOTE — Progress Notes (Addendum)
Pt's CIWA was 5. °

## 2023-04-25 NOTE — ED Provider Notes (Cosign Needed Addendum)
Behavioral Health Progress Note  Date and Time: 04/25/2023 10:02 AM Name: Justin May MRN:  [id number]  Subjective:  Patient is seen in treatment team room this AM. Patient reports good sleep. He reports that he feels more energetic today. He reports good appetite, he reports that he "over-indulges, tries to compensate " He reports that his mood is "trying to transition to a new place." He reports withdrawal symptoms as mild tremors and sweats. He reports his main psychiatric symptom is his anxiety. He has insight and states that he knows that his anxiety was not so bad that he needed to be on benzos but he got used to being on them. Discussed prior medication trials and he reports non-compliance with prior SSRIs including lexapro, zoloft, remeron. He reports that buspar made him feel like a zombie, numb, that he wanted to jump out of his body. He consents to medication trial with lexapro. He also reports interest in starting naltrexone. He reports that his goal is to be more social, workout, and go to groups. He denies SI/HI/AVH.   Talked with fellowship hall psychiatrist with patient. Reports patient is appropriate and could be admitted today, tomorrow or on Monday.   Also talked with mom. Mom reports that she could provide transportation for the patient if he is discharged today, tomorrow or on Monday but not Friday-Sunday.   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 Prior hospitalization at [facility] was in August 2021 for depression and polysbustance abuse. SI attempt by cutting wrist in 2019   He has prior medication trials of buspar  5 TID, ativan 0.5 BID PRN, remeron 30, adderall, lexapro 5, zoloft 50, and gabapentin 200 TID. Current home medications are zoloft 25 and klonopin 0.5 TID. Past Medical History: rash (bx suspect psoriasis)  Family History: polysubstance abuse with EtOH and schizophrenia in Maternal Uncle.  Social History: Unemployed. Homeless. Not welcome back in home per mom. Graduated [employer]. Single with no children. He reports he was about to get a job the day before coming in but then he used coke. He reports his mom lives in [place]. He reports legal charges of being on probation in [place] until 2025 for assault and attempted strangulation. He reports upcoming court date in August 11/12 for trespassing and hit and run in [employer].   Additional Social History:                         Sleep: Good  Appetite:  Good  Current Medications:  Current Facility-Administered Medications  Medication Dose Route Frequency Provider Last Rate Last Admin   acetaminophen (TYLENOL) tablet 650 mg  650 mg Oral Q6H PRN Peterson Ao, MD       alum & mag hydroxide-simeth (MAALOX/MYLANTA) 200-200-20 MG/5ML suspension 30 mL  30 mL Oral Q4H PRN Peterson Ao, MD       chlordiazePOXIDE (LIBRIUM) capsule 25 mg  25 mg Oral Q6H PRN Karie Fetch, MD       chlordiazePOXIDE (LIBRIUM) capsule 25 mg  25 mg Oral QID Karie Fetch, MD   25 mg at 04/25/23 1324   Followed by   chlordiazePOXIDE (LIBRIUM) capsule 25 mg  25 mg  Oral TID Karie Fetch, MD       Followed by   Melene Muller ON 04/26/2023] chlordiazePOXIDE (LIBRIUM) capsule 25 mg  25 mg Oral Elesa Hacker, MD       Followed by   Melene Muller ON 04/27/2023] chlordiazePOXIDE (LIBRIUM) capsule 25 mg  25 mg Oral Daily Karie Fetch, MD       diphenhydrAMINE-zinc acetate (BENADRYL) 2-0.1 % cream   Topical Daily PRN Karie Fetch, MD       escitalopram (LEXAPRO) tablet 10 mg  10 mg Oral Daily Karie Fetch, MD   10 mg at 04/25/23 0920    gabapentin (NEURONTIN) capsule 300 mg  300 mg Oral TID Peterson Ao, MD   300 mg at 04/25/23 7253   triamcinolone cream (KENALOG) 0.1 % cream   Topical BID PRN Nelly Rout, MD       And   hydrocerin (EUCERIN) cream   Topical BID PRN Nelly Rout, MD       hydrOXYzine (ATARAX) tablet 25 mg  25 mg Oral TID PRN Peterson Ao, MD       loperamide (IMODIUM) capsule 2-4 mg  2-4 mg Oral PRN Karie Fetch, MD       magnesium hydroxide (MILK OF MAGNESIA) suspension 30 mL  30 mL Oral Daily PRN Peterson Ao, MD       multivitamin with minerals tablet 1 tablet  1 tablet Oral Daily Peterson Ao, MD   1 tablet at 04/25/23 6644   nicotine (NICODERM CQ - dosed in mg/24 hours) patch 21 mg  21 mg Transdermal Once Karie Fetch, MD   21 mg at 04/24/23 1240   ondansetron (ZOFRAN-ODT) disintegrating tablet 4 mg  4 mg Oral Q6H PRN Karie Fetch, MD       traZODone (DESYREL) tablet 50 mg  50 mg Oral QHS PRN Peterson Ao, MD   50 mg at 04/24/23 2138   Current Outpatient Medications  Medication Sig Dispense Refill   gabapentin (NEURONTIN) 300 MG capsule Take 1 capsule (300 mg total) by mouth 3 (three) times daily.     hydrOXYzine (ATARAX) 25 MG tablet Take 1 tablet (25 mg total) by mouth 3 (three) times daily as needed for anxiety.     Multiple Vitamin (MULTIVITAMIN WITH MINERALS) TABS tablet Take 1 tablet by mouth daily.     Naphazoline HCl (CLEAR EYES OP) Place 3-4 drops into both eyes 4 (four) times daily as needed (For eye irritation).     sertraline (ZOLOFT) 25 MG tablet Take 25 mg by mouth daily. (Patient not taking: Reported on 04/23/2023)     thiamine (VITAMIN B-1) 100 MG tablet Take 1 tablet (100 mg total) by mouth daily.     traZODone (DESYREL) 50 MG tablet Take 1 tablet (50 mg total) by mouth at bedtime as needed for sleep.      Labs  Lab Results:  Admission on 04/23/2023, Discharged on 04/24/2023  Component Date Value Ref Range Status   WBC 04/23/2023 5.8  4.0 - 10.5 K/uL  Final   RBC 04/23/2023 5.53  4.22 - 5.81 MIL/uL Final   Hemoglobin 04/23/2023 17.2 (H)  13.0 - 17.0 g/dL Final   HCT 03/47/4259 51.2  39.0 - 52.0 % Final   MCV 04/23/2023 92.6  80.0 - 100.0 fL Final   MCH 04/23/2023 31.1  26.0 - 34.0 pg Final   MCHC 04/23/2023 33.6  30.0 - 36.0 g/dL Final   RDW 56/38/7564 13.5  11.5 - 15.5 % Final   Platelets 04/23/2023 287  150 - 400 K/uL Final   nRBC 04/23/2023 0.0  0.0 - 0.2 % Final   Neutrophils Relative % 04/23/2023 56  % Final   Neutro Abs 04/23/2023 3.2  1.7 - 7.7 K/uL Final   Lymphocytes Relative 04/23/2023 26  % Final   Lymphs Abs 04/23/2023 1.5  0.7 - 4.0 K/uL Final   Monocytes Relative 04/23/2023 11  % Final   Monocytes Absolute 04/23/2023 0.7  0.1 - 1.0 K/uL Final   Eosinophils Relative 04/23/2023 6  % Final   Eosinophils Absolute 04/23/2023 0.3  0.0 - 0.5 K/uL Final   Basophils Relative 04/23/2023 1  % Final   Basophils Absolute 04/23/2023 0.1  0.0 - 0.1 K/uL Final   Immature Granulocytes 04/23/2023 0  % Final   Abs Immature Granulocytes 04/23/2023 0.02  0.00 - 0.07 K/uL Final   Performed at [facility], [address] [place], [place] [address]   Sodium 04/23/2023 143  135 - 145 mmol/L Final   Potassium 04/23/2023 4.5  3.5 - 5.1 mmol/L Final   Chloride 04/23/2023 103  98 - 111 mmol/L Final   CO2 04/23/2023 27  22 - 32 mmol/L Final   Glucose, Bld 04/23/2023 40 (LL)  70 - 99 mg/dL Final   Comment: CRITICAL RESULT CALLED TO, READ BACK BY AND VERIFIED WITH N.JEFFERIES RN [id number] M.ALAMANO Glucose reference range applies only to samples taken after fasting for at least 8 hours.    BUN 04/23/2023 7  6 - 20 mg/dL Final   Creatinine, Ser 04/23/2023 0.81  0.61 - 1.24 mg/dL Final   Calcium 29/56/2130 9.8  8.9 - 10.3 mg/dL Final   Total Protein 86/57/8469 7.8  6.5 - 8.1 g/dL Final   Albumin 62/95/2841 5.1 (H)  3.5 - 5.0 g/dL Final   AST 32/44/0102 35  15 - 41 U/L Final   ALT 04/23/2023 26  0 - 44 U/L Final   Alkaline Phosphatase  04/23/2023 92  38 - 126 U/L Final   Total Bilirubin 04/23/2023 0.6  0.3 - 1.2 mg/dL Final   GFR, Estimated 04/23/2023 >60  >60 mL/min Final   Comment: (NOTE) Calculated using the CKD-EPI Creatinine Equation (2021)    Anion gap 04/23/2023 13  5 - 15 Final   Performed at [facility], [address] [place], [place] [address]   Alcohol, Ethyl (B) 04/23/2023 <10  <10 mg/dL Final   Comment: (NOTE) Lowest detectable limit for serum alcohol is 10 mg/dL.  For medical purposes only. Performed at [facility], [address], [place], [place] [address]    Color, Urine 04/23/2023 YELLOW  YELLOW Final   APPearance 04/23/2023 CLEAR  CLEAR Final   Specific Gravity, Urine 04/23/2023 1.020  1.005 - 1.030 Final   pH 04/23/2023 6.0  5.0 - 8.0 Final   Glucose, UA 04/23/2023 NEGATIVE  NEGATIVE mg/dL Final   Hgb urine dipstick 04/23/2023 NEGATIVE  NEGATIVE Final   Bilirubin Urine 04/23/2023 NEGATIVE  NEGATIVE Final   Ketones, ur 04/23/2023 NEGATIVE  NEGATIVE mg/dL Final   Protein, ur 47/42/5956 NEGATIVE  NEGATIVE mg/dL Final   Nitrite 38/75/6433 NEGATIVE  NEGATIVE Final   Leukocytes,Ua 04/23/2023 NEGATIVE  NEGATIVE Final   Performed at [facility], [address], [place], [place] [address]   POC Amphetamine UR 04/23/2023 None Detected (A)  NONE DETECTED (Cut Off Level 1000 ng/mL) Final   POC Secobarbital (BAR) 04/23/2023 None Detected  NONE DETECTED (Cut Off Level 300 ng/mL) Final  POC Buprenorphine (BUP) 04/23/2023 None Detected  NONE DETECTED (Cut Off Level 10 ng/mL) Final   POC Oxazepam (BZO) 04/23/2023 Positive (A)  NONE DETECTED (Cut Off Level 300 ng/mL) Final   POC Cocaine UR 04/23/2023 Positive (A)  NONE DETECTED (Cut Off Level 300 ng/mL) Final   POC Methamphetamine UR 04/23/2023 Positive (A)  NONE DETECTED (Cut Off Level 1000 ng/mL) Final   POC Morphine 04/23/2023 None Detected  NONE DETECTED (Cut Off Level 300 ng/mL) Final   POC Methadone UR 04/23/2023 None Detected   NONE DETECTED (Cut Off Level 300 ng/mL) Final   POC Oxycodone UR 04/23/2023 None Detected  NONE DETECTED (Cut Off Level 100 ng/mL) Final   POC Marijuana UR 04/23/2023 Positive (A)  NONE DETECTED (Cut Off Level 50 ng/mL) Final   Glucose-Capillary 04/23/2023 125 (H)  70 - 99 mg/dL Final   Glucose reference range applies only to samples taken after fasting for at least 8 hours.  Office Visit on 02/27/2023  Component Date Value Ref Range Status   HIV 1&2 Ab, 4th Generation 02/27/2023 NON-REACTIVE  NON-REACTIVE Final   Comment: HIV-1 antigen and HIV-1/HIV-2 antibodies were not detected. There is no laboratory evidence of HIV infection. Marland Kitchen PLEASE NOTE: This information has been disclosed to you from records whose confidentiality may be protected by state law.  If your state requires such protection, then the state law prohibits you from making any further disclosure of the information without the specific written consent of the person to whom it pertains, or as otherwise permitted by law. A general authorization for the release of medical or other information is NOT sufficient for this purpose. . For additional information please refer to http://education.quest[facility].com/faq/FAQ106 (This link is being provided for informational/ educational purposes only.) . Marland Kitchen The performance of this assay has not been clinically validated in patients less than 37 years old. .    Hep B S Ab 02/27/2023 NON-REACTIVE  NON-REACTIVE Final   Hep B Core Total Ab 02/27/2023 NON-REACTIVE  NON-REACTIVE Final   Comment: . For additional information, please refer to  http://education.[facility].com/faq/FAQ202  (This link is being provided for informational/ educational purposes only.) .    Hepatitis B Surface Ag 02/27/2023 NON-REACTIVE  NON-REACTIVE Final   Comment: . For additional information, please refer to  http://education.[facility].com/faq/FAQ202  (This link is being provided for  informational/ educational purposes only.) .    Hepatitis C Ab 02/27/2023 NON-REACTIVE  NON-REACTIVE Final   Comment: . HCV antibody was non-reactive. There is no laboratory  evidence of HCV infection. . In most cases, no further action is required. However, if recent HCV exposure is suspected, a test for HCV RNA (test code 16[address]) is suggested. . For additional information please refer to http://education.[facility].com/faq/FAQ22v1 (This link is being provided for informational/ educational purposes only.) .    HIV 1 RNA Quant 02/27/2023 NOT DETECTED  copies/mL Final   HIV-1 RNA Quant, Log 02/27/2023 NOT DETECTED  Log copies/mL Final   Comment: REFERENCE RANGE:           NOT DETECTED  copies/mL           NOT DETECTED  Log copies/mL . This test was performed using Real-Time Polymerase Chain Reaction. . Reportable range is 20 to 10,000,000 copies/mL (1.30-7.00 Log copies/mL).    Neisseria Gonorrhea 02/27/2023 Negative   Final   Chlamydia 02/27/2023 Negative   Final   Comment 02/27/2023 Normal Reference Ranger Chlamydia - Negative   Final   Comment 02/27/2023 Normal Reference Range Neisseria Gonorrhea -  Negative   Final   RPR Ser Ql 02/27/2023 NON-REACTIVE  NON-REACTIVE Final   Comment: . No laboratory evidence of syphilis. If recent exposure is suspected, submit a new sample in 2-4 weeks. Marland Kitchen    Heterophile, Mono Screen 02/27/2023 NEGATIVE  NEGATIVE Final   EBV VCA IgM 02/27/2023 <36.00  U/mL Final   Comment:       U/mL              Interpretation       ----              --------------       <36.00            Negative       36.00-43.99       Equivocal       >43.99            Positive    EBV VCA IgG 02/27/2023 76.90 (H)  U/mL Final   Comment:        U/mL             Interpretation        ----             --------------        <18.00           Negative        18.00-21.99      Equivocal        >21.99           Positive    EBV NA IgG 02/27/2023 21.60 (H)  U/mL  Final   Comment:        U/mL             Interpretation        ----             --------------        <18.00           Negative        18.00-21.99      Equivocal        >21.99           Positive    Interpretation 02/27/2023    Final   Comment: . Suggestive of a recent or past Epstein-Barr virus infection. .    Cytomegalovirus Ab-IgG 02/27/2023 7.10 (H)  U/mL Final   Comment:                            U/mL         Interpretation                            -----         --------------                            <0.60         Negative                            0.60-0.69     Equivocal                            > or = 0.70   Positive . A positive result indicates that the patient has  antibody to  CMV. It does not differentiate between an active or past infection.    CMV IgM 02/27/2023 <30.00  AU/mL Final   Comment:     AU/mL                 Interpretation     -----                 --------------     <30.00                No Antibody Detected     30.00-34.99           Equivocal     > or = 35.00          Antibody Detected . Results from any one IgM assay should not be used as a sole determinant of a current or recent infection.  Because an IgM test can yield false positive results and  low level IgM antibody may persist for more than 12  months post infection, reliance on a single test result  could be misleading. Acute infection is best diagnosed  by demonstrating the conversion of IgG from negative to  positive. If an acute infection is suspected, consider  obtaining a new specimen and submit for both IgG and IgM  testing in two or more weeks. .    Adenovirus B 02/27/2023 Not Detected  Not Detected Final   Rhinovirus 02/27/2023 Not Detected  Not Detected Final   Influenza A 02/27/2023 Not Detected  Not Detected Final   INFLUENZA A SUBTYPE H1 02/27/2023 Not Detected  Not Detected Final   INFLUENZA A SUBTYPE H3 02/27/2023 Not Detected  Not Detected Final   Influenza B  02/27/2023 Not Detected  Not Detected Final   Metapneumovirus 02/27/2023 Not Detected  Not Detected Final   Respiratory Syncytial Virus A 02/27/2023 Not Detected  Not Detected Final   Respiratory Syncytial Virus B 02/27/2023 Not Detected  Not Detected Final   HUMAN PARAINFLU VIRUS 1 02/27/2023 Not Detected  Not Detected Final   HUMAN PARAINFLU VIRUS 2 02/27/2023 Not Detected  Not Detected Final   HUMAN PARAINFLU VIRUS 3 02/27/2023 Not Detected  Not Detected Final   Comment 02/27/2023 see note   Final   Comment: THIS ASSAY WILL NOT DETECT SARS-CoV-2 (COVID-19) . This test is performed using the [employer]. . . Limitations: A negative result does not rule out respiratory pathogen infection below the sensitivity limit of the assay. The sensitivity depends on pathogen and sample type. This assay cannot reliably distinguish between Rhinovirus and Enterovirus due to genetic similarities between the viruses. .   Admission on 02/10/2023, Discharged on 02/10/2023  Component Date Value Ref Range Status   HIV Screen 4th Generation wRfx 02/10/2023 Non Reactive  Non Reactive Final   Performed at [facility], [address], [place], [place] [address]   RPR Ser Ql 02/10/2023 NON REACTIVE  NON REACTIVE Final   Performed at [facility], [address] [place], [place] [address]   Spotted Fever Group IgG 02/10/2023 <1:64  Neg:<1:64 Final   Comment: (NOTE) This test was developed and its performance characteristics determined by [facility]. It has not been cleared or approved by the Food and Drug Administration.    Spotted Fever Group IgM 02/10/2023 <1:64  Neg:<1:64 Final   Comment: (NOTE) This test was developed and its performance characteristics determined by [facility]. It has not been cleared or approved by the Food and Drug Administration.  Result Comment 02/10/2023 Comment   Final   Comment: (NOTE) Spotted Fever Group IgG serum endpoint titer of >=1:64  is suggestive of infection at an unknown time and may be a sign of either past infection or early response to a recent infection. Spotted Fever Group IgM titer of >=1:64 is regarded as probable evidence of recent or ongoing infection. A four-fold or greater increase in titer between two serum samples drawn 1-2 weeks apart and tested in parallel is the best serologic indicator of a recent rickettsial infection. Performed At: [facility] [address] [place], [place] [id number] Jolene Schimke MD WU:[id number]    Sodium 02/10/2023 140  135 - 145 mmol/L Final   Potassium 02/10/2023 3.6  3.5 - 5.1 mmol/L Final   Chloride 02/10/2023 104  98 - 111 mmol/L Final   CO2 02/10/2023 26  22 - 32 mmol/L Final   Glucose, Bld 02/10/2023 118 (H)  70 - 99 mg/dL Final   Glucose reference range applies only to samples taken after fasting for at least 8 hours.   BUN 02/10/2023 11  6 - 20 mg/dL Final   Creatinine, Ser 02/10/2023 0.73  0.61 - 1.24 mg/dL Final   Calcium 95/62/1308 8.9  8.9 - 10.3 mg/dL Final   Total Protein 65/78/4696 6.8  6.5 - 8.1 g/dL Final   Albumin 29/52/8413 4.2  3.5 - 5.0 g/dL Final   AST 24/40/1027 16  15 - 41 U/L Final   ALT 02/10/2023 8  0 - 44 U/L Final   Alkaline Phosphatase 02/10/2023 76  38 - 126 U/L Final   Total Bilirubin 02/10/2023 0.9  0.3 - 1.2 mg/dL Final   GFR, Estimated 02/10/2023 >60  >60 mL/min Final   Comment: (NOTE) Calculated using the CKD-EPI Creatinine Equation (2021)    Anion gap 02/10/2023 10  5 - 15 Final   Performed at [facility], [address] [place], [place] [address]   WBC 02/10/2023 8.2  4.0 - 10.5 K/uL Final   RBC 02/10/2023 5.45  4.22 - 5.81 MIL/uL Final   Hemoglobin 02/10/2023 16.6  13.0 - 17.0 g/dL Final   HCT 44/11/4740 47.9  39.0 - 52.0 % Final   MCV 02/10/2023 87.9  80.0 - 100.0 fL Final   MCH 02/10/2023 30.5  26.0 - 34.0 pg Final   MCHC 02/10/2023 34.7  30.0 - 36.0 g/dL Final   RDW 59/56/3875 13.1  11.5 -  15.5 % Final   Platelets 02/10/2023 228  150 - 400 K/uL Final   nRBC 02/10/2023 0.0  0.0 - 0.2 % Final   Neutrophils Relative % 02/10/2023 64  % Final   Neutro Abs 02/10/2023 5.3  1.7 - 7.7 K/uL Final   Lymphocytes Relative 02/10/2023 23  % Final   Lymphs Abs 02/10/2023 1.9  0.7 - 4.0 K/uL Final   Monocytes Relative 02/10/2023 9  % Final   Monocytes Absolute 02/10/2023 0.7  0.1 - 1.0 K/uL Final   Eosinophils Relative 02/10/2023 3  % Final   Eosinophils Absolute 02/10/2023 0.2  0.0 - 0.5 K/uL Final   Basophils Relative 02/10/2023 1  % Final   Basophils Absolute 02/10/2023 0.1  0.0 - 0.1 K/uL Final   Immature Granulocytes 02/10/2023 0  % Final   Abs Immature Granulocytes 02/10/2023 0.02  0.00 - 0.07 K/uL Final   Performed at [facility], [address] [place], [place] [address]  Admission on 02/05/2023, Discharged on 02/05/2023  Component Date Value Ref Range Status  Sodium 02/05/2023 139  135 - 145 mmol/L Final   Potassium 02/05/2023 3.6  3.5 - 5.1 mmol/L Final   Chloride 02/05/2023 102  98 - 111 mmol/L Final   CO2 02/05/2023 29  22 - 32 mmol/L Final   Glucose, Bld 02/05/2023 93  70 - 99 mg/dL Final   Glucose reference range applies only to samples taken after fasting for at least 8 hours.   BUN 02/05/2023 11  6 - 20 mg/dL Final   Creatinine, Ser 02/05/2023 0.89  0.61 - 1.24 mg/dL Final   Calcium 47/82/9562 8.9  8.9 - 10.3 mg/dL Final   GFR, Estimated 02/05/2023 >60  >60 mL/min Final   Comment: (NOTE) Calculated using the CKD-EPI Creatinine Equation (2021)    Anion gap 02/05/2023 8  5 - 15 Final   Performed at [facility], [address] [place], [place] [address]   WBC 02/05/2023 8.3  4.0 - 10.5 K/uL Final   RBC 02/05/2023 5.55  4.22 - 5.81 MIL/uL Final   Hemoglobin 02/05/2023 17.0  13.0 - 17.0 g/dL Final   HCT 57/84/6962 49.4  39.0 - 52.0 % Final   MCV 02/05/2023 89.0  80.0 - 100.0 fL Final   MCH 02/05/2023 30.6  26.0 - 34.0 pg  Final   MCHC 02/05/2023 34.4  30.0 - 36.0 g/dL Final   RDW 95/28/4132 13.0  11.5 - 15.5 % Final   Platelets 02/05/2023 277  150 - 400 K/uL Final   nRBC 02/05/2023 0.0  0.0 - 0.2 % Final   Neutrophils Relative % 02/05/2023 60  % Final   Neutro Abs 02/05/2023 5.0  1.7 - 7.7 K/uL Final   Lymphocytes Relative 02/05/2023 29  % Final   Lymphs Abs 02/05/2023 2.4  0.7 - 4.0 K/uL Final   Monocytes Relative 02/05/2023 8  % Final   Monocytes Absolute 02/05/2023 0.7  0.1 - 1.0 K/uL Final   Eosinophils Relative 02/05/2023 2  % Final   Eosinophils Absolute 02/05/2023 0.2  0.0 - 0.5 K/uL Final   Basophils Relative 02/05/2023 1  % Final   Basophils Absolute 02/05/2023 0.0  0.0 - 0.1 K/uL Final   Immature Granulocytes 02/05/2023 0  % Final   Abs Immature Granulocytes 02/05/2023 0.02  0.00 - 0.07 K/uL Final   Performed at [facility], [address] [place], [place] [address]    Blood Alcohol level:  Lab Results  Component Value Date   ETH <10 04/23/2023   ETH 42 (H) 07/29/2022    Metabolic Disorder Labs: No results found for: "HGBA1C", "MPG" No results found for: "PROLACTIN" Lab Results  Component Value Date   CHOL 166 05/14/2020   TRIG 187 (H) 05/14/2020   HDL 68 05/14/2020   CHOLHDL 2.4 05/14/2020   VLDL 37 05/14/2020   LDLCALC 61 05/14/2020    Therapeutic Lab Levels: No results found for: "LITHIUM" No results found for: "VALPROATE" No results found for: "CBMZ"  Physical Findings   AIMS    Flowsheet Row [facility]) [facility]  AIMS Total Score 0      AUDIT    Flowsheet Row ED from 04/24/2023 in [facility] Admission (Discharged) from OP [facility] Admission (Discharged) [facility]  Alcohol Use Disorder Identification Test Final Score (AUDIT) 37 17 17       GAD-7    Flowsheet Row  Office Visit from 08/04/2020 in Primary Care at [facility] Visit from 11/16/2017 in Primary Care at [facility] Visit from 05/17/2017 in Primary Care at [facility] Visit from 06/16/2016 in [facility]  Total GAD-7 Score 20 6 12 18       PHQ2-9    Flowsheet Row ED from 04/24/2023 in [facility] Telemedicine from 10/07/2020 in Primary Care at [facility] Visit from 08/04/2020 in Primary Care at [facility] Visit from 01/30/2020 in Primary Care at [facility] Visit from 08/04/2019 in Primary Care at [facility] Total Score 6 0 2 0 0  PHQ-9 Total Score 23 -- 19 -- --      Flowsheet Row ED from 04/24/2023 in [facility] ED from 04/23/2023 in [facility] ED from [facility]/17/2023 [facility] [facility] Emergency Department at [facility]  C-SSRS RISK CATEGORY No Risk Moderate Risk No Risk        Musculoskeletal  Strength & Muscle Tone: within normal limits Gait & Station: normal Patient leans: N/A  Psychiatric Specialty Exam  Presentation  General Appearance:  Appropriate for Environment; Casual  Eye Contact: Good  Speech: Clear and Coherent  Speech Volume: Normal  Handedness: Right   Mood and Affect  Mood: Feel more energetic   Affect: Flat   Thought Process  Thought Processes: Coherent; Goal Directed; Linear  Descriptions of Associations:Intact  Orientation:Full (Time, Place and Person)  Thought Content:Logical  Diagnosis of Schizophrenia or Schizoaffective disorder in past: No    Hallucinations:Hallucinations: None  Ideas of Reference:None  Suicidal Thoughts: Denies  Homicidal Thoughts:Homicidal Thoughts: No   Sensorium  Memory: Immediate Good; Recent Fair  Judgment: Fair  Insight: Good   Executive Functions  Concentration: Good  Attention Span: Good  Recall: Fair  Fund of  Knowledge: Good  Language: Good   Psychomotor Activity  Psychomotor Activity: Psychomotor Activity: Normal   Assets  Assets: Desire for Improvement; Financial Resources/Insurance; Social Support   Sleep  Sleep: Sleep: Good Number of Hours of Sleep: 8   Nutritional Assessment (For OBS and [facility] admissions only) Has the patient had a weight loss or gain of 10 pounds or more in the last 3 months?: No Has the patient had a decrease in food intake/or appetite?: No Does the patient have dental problems?: No Does the patient have eating habits or behaviors that may be indicators of an eating disorder including binging or inducing vomiting?: No Has the patient recently lost weight without trying?: 0 Has the patient been eating poorly because of a decreased appetite?: 0 Malnutrition Screening Tool Score: 0    Physical Exam  Physical Exam Constitutional:      Appearance: Normal appearance.  HENT:     Head: Normocephalic and atraumatic.  Pulmonary:     Effort: Pulmonary effort is normal.  Musculoskeletal:     Cervical back: Normal range of motion.  Neurological:     General: No focal deficit present.     Mental Status: He is alert.    Review of Systems  Constitutional:  Positive for diaphoresis.  HENT: Negative.    Eyes: Negative.   Respiratory: Negative.    Cardiovascular: Negative.   Gastrointestinal: Negative.   Genitourinary: Negative.   Musculoskeletal: Negative.   Skin:  Positive for rash. Negative for itching.  Neurological:  Positive for tremors.  Psychiatric/Behavioral:  Positive for substance abuse.    Blood pressure 131/75, pulse 90, temperature 97.6 F (36.4 C), temperature source Oral,  resp. rate 19, SpO2 99%. There is no height or weight on file to calculate BMI.  Treatment Plan Summary: Damyn Weitzel is a 27 yo male with a past psychiatric history of anxiety, alcohol use disorder, benzodiazepine dependence, opioid dependence, tobacco use disorder,  2 prior psychiatric hospitalizations and a past medical history of a seizure disorder (not on any medications), rash (suspected psoriasis) who presents to the [facility] to detox. Patient's current depressed mood, hopelessness and passive SI consistent with substance-induced mood disorder and withdrawal from multiple substances including methamphetamine and cocaine. He also has long history of benzodiazepine abuse, most recently using up to 4g a day. Will start him on librium taper. He appears motivated to detox though his disposition is complicated by upcoming legal charges.    Labs notable for initial glucose 40, 125. CBC Hgb 17.2. CMP Na 143. UA wnl. Ethanol <10. 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 Rx'ed triamcinolone cream BID, cerave anti-itch cream BID  -start triamcinolone cream BID PRN  -start benadryl cream BID PRN   H/o seizure disorder Last seizure reportedly a month ago. Last saw neurology in 2020, seizure was suspected to be in setting of ativan taper and was rx'ed keppra 500 BID. Patient has not been taking -continue to monitor  -referral to PCP/neurology on discharge    Dispo:  Accepted to [facility] for Monday 7/29 at 1:00pm, facility requesting a door to door  transfer  Karie Fetch, MD, PGY-2 04/25/2023 10:02 AM

## 2023-04-25 NOTE — Progress Notes (Signed)
Pt's CIWA was 4. 

## 2023-04-25 NOTE — Group Note (Signed)
Group Topic: Understanding Self  Group Date: 04/25/2023 Start Time: 1610 End Time: 1650 Facilitators: Oleh Genin, RN  Department: [facility]  Number of Participants: 10  Group Focus: acceptance, communication, and coping skills Treatment Modality:  Psychoeducation Interventions utilized were exploration, mental fitness, patient education, and problem solving Purpose: enhance coping skills, express feelings, and improve communication skills  Name: Justin May Date of Birth: 03-23-1996  MR: [id number]    Level of Participation: moderate Quality of Participation: attentive, cooperative, engaged, and initiates communication Interactions with others: gave feedback Mood/Affect: appropriate, bright, and brightens with interaction Triggers (if applicable): N/A Cognition: concrete, goal directed, and logical Progress: Gaining insight Response: Patient initiated conversation throughout the group session  Plan: patient will be encouraged to carry out his plans.  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

## 2023-04-25 NOTE — Group Note (Signed)
Group Topic: Positive Affirmations  Group Date: 04/25/2023 Start Time: 1015 End Time: 1045 Facilitators: Rayvon Char, Donata Clay; Derycke, Maryjane Hurter, NT  Department: [facility]  Number of Participants: 9  Group Focus: affirmation Treatment Modality:  Psychoeducation Interventions utilized were patient education Purpose: increase insight  Name: Justin May Date of Birth: [birth year]  MR: [id number]    Level of Participation: Patient did not attend group. 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

## 2023-04-25 NOTE — Group Note (Unsigned)
Group Topic: Understanding Self  Group Date: 04/25/2023 Start Time: 1610 End Time: 1645 Facilitators: Oleh Genin, RN  Department: [facility]  Number of Participants: 10  Group Focus: acceptance, coping skills, daily focus, discharge education, and problem solving Treatment Modality:  Psychoeducation Interventions utilized were mental fitness, patient education, and problem solving Purpose: enhance coping skills, express feelings, and increase insight   Name: Justin May Date of Birth: 1996/07/26  MR: [id number]    Level of Participation: {THERAPIES; PSYCH GROUP PARTICIPATION XBMWU:13244} Quality of Participation: {THERAPIES; PSYCH QUALITY OF PARTICIPATION:23992} Interactions with others: {THERAPIES; PSYCH INTERACTIONS:23993} Mood/Affect: {THERAPIES; PSYCH MOOD/AFFECT:23994} Triggers (if applicable): *** Cognition: {THERAPIES; PSYCH COGNITION:23995} Progress: {THERAPIES; PSYCH PROGRESS:23997} Response: *** Plan: {THERAPIES; PSYCH WNUU:72536}  Patients Problems:  Patient Active Problem List   Diagnosis Date Noted   Benzodiazepine abuse, continuous (HCC) 04/24/2023   Rash and other nonspecific skin eruption 02/28/2023   High risk heterosexual behavior 02/28/2023   Encounter to obtain excuse from work 02/28/2023   MDD (major depressive disorder), recurrent episode, severe (HCC) 05/13/2020   Seizure disorder (HCC) 07/24/2019   Substance abuse, binge pattern (HCC) 11/19/2017   Alcohol use disorder, moderate, dependence (HCC)    Alcohol use with alcohol-induced mood disorder (HCC) 07/01/2017   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

## 2023-04-25 NOTE — ED Notes (Signed)
Pt just went to restroom pt calm and cooperative no pain or  distress noted will continue to monitor for safety

## 2023-04-25 NOTE — ED Notes (Signed)
Pt in dayroom having AA meeting he is cam and cooperative no co pain ro distress will continue to monitor for safety

## 2023-04-25 NOTE — ED Notes (Signed)
Patient was given lunch. 

## 2023-04-25 NOTE — ED Notes (Signed)
Pt was given lunch

## 2023-04-25 NOTE — Discharge Planning (Signed)
LCSW went and spoke with the patient to provide updates. [facility] aware that [facility] [phone]) would like to [place] with him on this morning around 10:00am with the MD. Patient expressed understanding and reports he is willing to complete screening with agency. Patient also aware that [facility] [phone]), [facility] in [place] [phone]), and [facility] in [place] [phone]) is considering him for placement and has advised patient to call in to complete a phone screening. Numbers were provided to the patient for his follow up. Patient reports an interest in completing the [facility] interview and then reports he will follow up with the other agencies after. Patient reports he will also work his mother to get his probation officers number in order for LCSW to follow up regarding possibly getting his court dates moved back. Once number is received, LCSW will follow up. No other needs were reported by the patient. LCSW will continue to follow and provide support to patient while on [facility] unit.   LCSW also attempted to follow up with Pearlie Oyster at [facility] [phone]) regarding referral, however received no answer. Left voice message at 9:30am requesting phone call back.    Fernande Boyden, LCSW Clinical Social Worker [facility] Ph: [phone]

## 2023-04-25 NOTE — ED Notes (Signed)
Patient denies SI/HI. Patient ate breakfast this morning and received his medications. Patient is pleased that he has been accepted in a residential treatment facility.  Patient is being monitored for safety.

## 2023-04-25 NOTE — ED Notes (Signed)
Patient observed/assessed in room in bed appearing in no immediate distress resting peacefully. Q15 minute checks continued by MHT and nursing staff. Will continue to monitor and support. 

## 2023-04-25 NOTE — Group Note (Signed)
Group Topic: Communication  Group Date: 04/25/2023 Start Time: 1936 End Time: 2000 Facilitators: Rae Lips B  Department: [facility]  Number of Participants: 10  Group Focus: activities of daily living skills Treatment Modality:  Leisure Development Interventions utilized were story telling Purpose: enhance coping skills, express feelings, increase insight, regain self-worth, reinforce self-care, and relapse prevention strategies  Name: Justin May Date of Birth: 07-05-1996  MR: [id number]    Level of Participation: active Quality of Participation: attentive Interactions with others: gave feedback Mood/Affect: appropriate Triggers (if applicable): NA Cognition: coherent/clear Progress: Gaining insight Response: NA Plan: patient will be encouraged to Keep going to groups.   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

## 2023-04-25 NOTE — ED Notes (Signed)
Patient was provided dinner

## 2023-04-25 NOTE — ED Notes (Signed)
Patient was given breakfast.  

## 2023-04-25 NOTE — Group Note (Signed)
Group Topic: Wellness  Group Date: 04/24/2023 Start Time: 1900 End Time: 1930 Facilitators: Emmit Pomfret D, NT  Department: [facility]  Number of Participants: 1  Group Focus: acceptance Treatment Modality:  Psychoeducation Interventions utilized were assignment Purpose: increase insight  Name: Justin May Date of Birth: [birth year]  MR: [id number]    Level of Participation: withdrawn Quality of Participation: withdrawn Interactions with others:   Mood/Affect:   Triggers (if applicable):   Cognition:   Progress: Other Response:   Plan: follow-up needed  Patients Problems:  Patient Active Problem List   Diagnosis Date Noted   Benzodiazepine abuse, continuous (HCC) 04/24/2023   Rash and other nonspecific skin eruption 02/28/2023   High risk heterosexual behavior 02/28/2023   Encounter to obtain excuse from work 02/28/2023   MDD (major depressive disorder), recurrent episode, severe (HCC) 05/13/2020   Seizure disorder (HCC) 07/24/2019   Substance abuse, binge pattern (HCC) 11/19/2017   Alcohol use disorder, moderate, dependence (HCC)    Alcohol use with alcohol-induced mood disorder (HCC) 07/01/2017   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

## 2023-04-26 DIAGNOSIS — F1020 Alcohol dependence, uncomplicated: Secondary | ICD-10-CM | POA: Diagnosis not present

## 2023-04-26 MED ORDER — CHLORDIAZEPOXIDE HCL 25 MG PO CAPS
25.0000 mg | ORAL_CAPSULE | Freq: Two times a day (BID) | ORAL | Status: AC
  Administered 2023-04-26 (×2): 25 mg via ORAL
  Filled 2023-04-26 (×2): qty 1

## 2023-04-26 MED ORDER — CHLORDIAZEPOXIDE HCL 25 MG PO CAPS
25.0000 mg | ORAL_CAPSULE | Freq: Once | ORAL | Status: AC
  Administered 2023-04-27: 25 mg via ORAL
  Filled 2023-04-26: qty 1

## 2023-04-26 NOTE — ED Notes (Signed)
Patient alert and oriented x 3. Denies SI/HI/AVH. Denies intent or plan to harm self or others. Routine conducted according to faculty protocol. Encourage patient to notify staff with any needs or concerns. Patient verbalized agreement and understanding. Will continue to monitor for safety. 

## 2023-04-26 NOTE — ED Notes (Signed)
Patient in room. Environment is secured. Will continue to monitor for safety. 

## 2023-04-26 NOTE — Group Note (Signed)
Group Topic: N/A Group Date: 04/26/2023 Start Time: 0900 End Time: 1000 Facilitators: Candis Schatz, NT  Department: [facility]  Number of Participants: 7  Group Focus: daily focus Treatment Modality:  Spiritual Interventions utilized were support Purpose: increase insight  Name: Justin May Date of Birth: 06/26/1996  MR: [id number]    Level of Participation: Did not participate Quality of Participation: N/A Interactions with others: N/A Mood/Affect: N/A Triggers (if applicable): N/A Cognition: N/A Progress: Other Response: Inactive Plan: follow-up needed  Patients Problems:  Patient Active Problem List   Diagnosis Date Noted   Benzodiazepine abuse, continuous (HCC) 04/24/2023   Rash and other nonspecific skin eruption 02/28/2023   High risk heterosexual behavior 02/28/2023   Encounter to obtain excuse from work 02/28/2023   MDD (major depressive disorder), recurrent episode, severe (HCC) 05/13/2020   Seizure disorder (HCC) 07/24/2019   Substance abuse, binge pattern (HCC) 11/19/2017   Alcohol use disorder, moderate, dependence (HCC)    Alcohol use with alcohol-induced mood disorder (HCC) 07/01/2017   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

## 2023-04-26 NOTE — Group Note (Signed)
Group Topic: Communication  Group Date: 04/26/2023 Start Time: 1300 End Time: 1320 Facilitators: Merrie Roof, RN  Department: [facility]  Number of Participants: 7  Group Focus: coping skills Treatment Modality:  Patient-Centered Therapy Interventions utilized were assignment Purpose: express feelings  Name: Justin May Date of Birth: 1996/06/09  MR: [id number]    Level of Participation:  Quality of Participation:  Interactions with others:  Mood/Affect:  Triggers (if applicable):  Cognition:  Progress:  Response:  Plan:  Patient did not come to meeting  Patients Problems:  Patient Active Problem List   Diagnosis Date Noted   Benzodiazepine abuse, continuous (HCC) 04/24/2023   Rash and other nonspecific skin eruption 02/28/2023   High risk heterosexual behavior 02/28/2023   Encounter to obtain excuse from work 02/28/2023   MDD (major depressive disorder), recurrent episode, severe (HCC) 05/13/2020   Seizure disorder (HCC) 07/24/2019   Substance abuse, binge pattern (HCC) 11/19/2017   Alcohol use disorder, moderate, dependence (HCC)    Alcohol use with alcohol-induced mood disorder (HCC) 07/01/2017   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

## 2023-04-26 NOTE — Group Note (Signed)
Group Topic: Recovery Basics  Group Date: 04/26/2023 Start Time: 2000 End Time: 2100 Facilitators: Guss Bunde  Department: [facility]  Number of Participants: 6  Group Focus: relapse prevention Treatment Modality:  Behavior Modification Therapy Interventions utilized were patient education Purpose: relapse prevention strategies  Name: LINELL SHAWN Date of Birth: [birth year]  MR: [id number]    Level of Participation: active Quality of Participation: cooperative Interactions with others: gave feedback Mood/Affect: appropriate Triggers (if applicable):  Cognition: goal directed Progress: Moderate Response: Plan: patient will be encouraged to attend outside AA meetings  Patients Problems:  Patient Active Problem List   Diagnosis Date Noted   Benzodiazepine abuse, continuous (HCC) 04/24/2023   Rash and other nonspecific skin eruption 02/28/2023   High risk heterosexual behavior 02/28/2023   Encounter to obtain excuse from work 02/28/2023   MDD (major depressive disorder), recurrent episode, severe (HCC) 05/13/2020   Seizure disorder (HCC) 07/24/2019   Substance abuse, binge pattern (HCC) 11/19/2017   Alcohol use disorder, moderate, dependence (HCC)    Alcohol use with alcohol-induced mood disorder (HCC) 07/01/2017   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

## 2023-04-26 NOTE — ED Notes (Addendum)
Patient noted been on the phone @ intervals, stated he's trying to contact his PO, fearing that she had ordered him to be picked on by the [employer] due to probation violation. Patient is requesting to leave AMA, so he can have some "free time" out before going back to jail. The 72 hr. hold was explain to him, and he decided not to leave and stay here because his mom wanted him too. His mood seems slightly irritable but, have committed to safety. Pt also denied having withdrawal sx. Will continue to monitor for safety and educate on plan of care.

## 2023-04-26 NOTE — Group Note (Signed)
Group Topic: Communication  Group Date: 04/26/2023 Start Time: 2130 End Time: 2200 Facilitators: Guss Bunde  Department: [facility]  Number of Participants:   Group Focus: Wrap Up Group Treatment Modality:   Interventions utilized were  Purpose: This group is focused on patient's question and concerns about standard care and goals relating to discharge planning and bedside manner.  Patients have a chance to share positive experiences and per support.  Name: LATEEF JUNCAJ Date of Birth: Oct 31, 1995  MR: [id number]    Level of Participation: active Quality of Participation: attentive Interactions with others: gave feedback Mood/Affect: appropriate Triggers (if applicable):  Cognition: insightful Progress: Moderate Response: Pt is pleased with overall care but is concerned about taking lexapro. 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

## 2023-04-26 NOTE — ED Notes (Signed)
Patient is sleeping. Respirations equal and unlabored, skin warm and dry. No change in assessment or acuity. Routine safety checks conducted according to facility protocol. Will continue to monitor for safety.   

## 2023-04-26 NOTE — ED Notes (Signed)
Patient  sleeping in no acute stress. RR even and unlabored .Environment secured .Will continue to monitor for safely. 

## 2023-04-26 NOTE — ED Notes (Signed)
Patient reports aniexty refused atarax.

## 2023-04-26 NOTE — ED Notes (Signed)
Pt is in the dayroom watching TV with peers whiles having snacks Pt denies SI/HI/AVH. No acute distress noted. Will continue to monitor for safety.

## 2023-04-26 NOTE — Group Note (Signed)
Group Topic: Wellness  Group Date: 04/26/2023 Start Time: 1140 End Time: 1220 Facilitators: Londell Moh, NT  Department: [facility]  Number of Participants: 10  Group Focus: other Nutrition Treatment Modality:  Psychoeducation Interventions utilized were patient education Purpose: increase insight  Name: Justin May Date of Birth: 12/07/1995  MR: [id number]    Level of Participation: active Quality of Participation: attentive Interactions with others: gave feedback Mood/Affect: appropriate Triggers (if applicable): n/a Cognition: coherent/clear Progress: Gaining insight Response: Pt was active during group. Dietitian came in and spoke to group about healthy eating and good habits. Plan: patient will be encouraged to continue to attend groups  Patients Problems:  Patient Active Problem List   Diagnosis Date Noted   Benzodiazepine abuse, continuous (HCC) 04/24/2023   Rash and other nonspecific skin eruption 02/28/2023   High risk heterosexual behavior 02/28/2023   Encounter to obtain excuse from work 02/28/2023   MDD (major depressive disorder), recurrent episode, severe (HCC) 05/13/2020   Seizure disorder (HCC) 07/24/2019   Substance abuse, binge pattern (HCC) 11/19/2017   Alcohol use disorder, moderate, dependence (HCC)    Alcohol use with alcohol-induced mood disorder (HCC) 07/01/2017   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

## 2023-04-26 NOTE — ED Provider Notes (Signed)
Behavioral Health Progress Note  Date and Time: 04/26/2023 9:57 AM Name: Justin May MRN:  [id number]  Subjective:  Patient is seen laying in bed this AM. Patient reports his sleep was alright. He reports eating breakfast this AM. He reports his mood is irritable because he feels anxious. He reports that when he gets off alcohol he gets angry because he gets anxious and he can't socialize with people when he's anxious. He reports concern that he is going to stay like this forever and "feel like I'm going to mess it up." He reports he declined going to group this AM because "I didn't want to listen to someone go on and on." He denies side effects with starting lexapro. He also reports interest in starting naltrexone. He denies SI/HI/AVH.   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 Prior hospitalization at [facility] was in August 2021 for depression and polysbustance abuse. SI attempt by cutting wrist in 2019   He has prior medication trials of buspar 5 TID, ativan 0.5 BID PRN, remeron 30, adderall, lexapro 5, zoloft 50, and gabapentin 200 TID. Current home medications are zoloft 25 and klonopin 0.5 TID. Past Medical History: rash (bx suspect psoriasis)  Family History: polysubstance abuse with EtOH and schizophrenia in Maternal Uncle.  Social History: Unemployed. Homeless. Not welcome back in home per mom. Graduated [employer]. Single with no children. He reports he was about to get a job the day before coming in but then he used coke. He reports his mom lives in [place]. He reports legal charges of being on probation in [place] until 2025  for assault and attempted strangulation. He reports upcoming court date in August 11/12 for trespassing and hit and run in [employer].   Additional Social History:                         Sleep: Fair  Appetite:  Good  Current Medications:  Current Facility-Administered Medications  Medication Dose Route Frequency Provider Last Rate Last Admin   acetaminophen (TYLENOL) tablet 650 mg  650 mg Oral Q6H PRN Peterson Ao, MD   650 mg at 04/25/23 1827   alum & mag hydroxide-simeth (MAALOX/MYLANTA) 200-200-20 MG/5ML suspension 30 mL  30 mL Oral Q4H PRN Peterson Ao, MD       chlordiazePOXIDE (LIBRIUM) capsule 25 mg  25 mg Oral Q6H PRN Karie Fetch, MD       chlordiazePOXIDE (LIBRIUM) capsule 25 mg  25 mg Oral BID Karie Fetch, MD       Followed by   Melene Muller ON 04/27/2023] chlordiazePOXIDE (LIBRIUM) capsule 25 mg  25 mg Oral Once Karie Fetch, MD       diphenhydrAMINE-zinc acetate (BENADRYL) 2-0.1 % cream   Topical Daily PRN Karie Fetch, MD       escitalopram (LEXAPRO) tablet 10 mg  10 mg Oral Daily Karie Fetch, MD   10 mg at 04/25/23 0920   gabapentin (NEURONTIN) capsule 300 mg  300 mg Oral Once Karie Fetch, MD       triamcinolone cream (KENALOG) 0.1 % cream   Topical BID PRN Nelly Rout, MD   Given at 04/25/23 1819   And  hydrocerin (EUCERIN) cream   Topical BID PRN Nelly Rout, MD   Given at 04/25/23 1819   hydrOXYzine (ATARAX) tablet 25 mg  25 mg Oral TID PRN Peterson Ao, MD   25 mg at 04/25/23 1827   loperamide (IMODIUM) capsule 2-4 mg  2-4 mg Oral PRN Karie Fetch, MD       magnesium hydroxide (MILK OF MAGNESIA) suspension 30 mL  30 mL Oral Daily PRN Peterson Ao, MD       multivitamin with minerals tablet 1 tablet  1 tablet Oral Daily Peterson Ao, MD   1 tablet at 04/25/23 0834   nicotine (NICODERM CQ - dosed in mg/24 hours) patch 21 mg  21 mg Transdermal Daily Karie Fetch, MD   21 mg at 04/25/23 1306   ondansetron  (ZOFRAN-ODT) disintegrating tablet 4 mg  4 mg Oral Q6H PRN Karie Fetch, MD       traZODone (DESYREL) tablet 50 mg  50 mg Oral QHS PRN Peterson Ao, MD   50 mg at 04/25/23 2122   Current Outpatient Medications  Medication Sig Dispense Refill   gabapentin (NEURONTIN) 300 MG capsule Take 1 capsule (300 mg total) by mouth 3 (three) times daily.     hydrOXYzine (ATARAX) 25 MG tablet Take 1 tablet (25 mg total) by mouth 3 (three) times daily as needed for anxiety.     Multiple Vitamin (MULTIVITAMIN WITH MINERALS) TABS tablet Take 1 tablet by mouth daily.     Naphazoline HCl (CLEAR EYES OP) Place 3-4 drops into both eyes 4 (four) times daily as needed (For eye irritation).     sertraline (ZOLOFT) 25 MG tablet Take 25 mg by mouth daily. (Patient not taking: Reported on 04/23/2023)     thiamine (VITAMIN B-1) 100 MG tablet Take 1 tablet (100 mg total) by mouth daily.     traZODone (DESYREL) 50 MG tablet Take 1 tablet (50 mg total) by mouth at bedtime as needed for sleep.      Labs  Lab Results:  Admission on 04/23/2023, Discharged on 04/24/2023  Component Date Value Ref Range Status   WBC 04/23/2023 5.8  4.0 - 10.5 K/uL Final   RBC 04/23/2023 5.53  4.22 - 5.81 MIL/uL Final   Hemoglobin 04/23/2023 17.2 (H)  13.0 - 17.0 g/dL Final   HCT 16/07/9603 51.2  39.0 - 52.0 % Final   MCV 04/23/2023 92.6  80.0 - 100.0 fL Final   MCH 04/23/2023 31.1  26.0 - 34.0 pg Final   MCHC 04/23/2023 33.6  30.0 - 36.0 g/dL Final   RDW 54/06/8118 13.5  11.5 - 15.5 % Final   Platelets 04/23/2023 287  150 - 400 K/uL Final   nRBC 04/23/2023 0.0  0.0 - 0.2 % Final   Neutrophils Relative % 04/23/2023 56  % Final   Neutro Abs 04/23/2023 3.2  1.7 - 7.7 K/uL Final   Lymphocytes Relative 04/23/2023 26  % Final   Lymphs Abs 04/23/2023 1.5  0.7 - 4.0 K/uL Final   Monocytes Relative 04/23/2023 11  % Final   Monocytes Absolute 04/23/2023 0.7  0.1 - 1.0 K/uL Final   Eosinophils Relative 04/23/2023 6  % Final   Eosinophils  Absolute 04/23/2023 0.3  0.0 - 0.5 K/uL Final   Basophils Relative 04/23/2023 1  % Final   Basophils Absolute 04/23/2023 0.1  0.0 - 0.1 K/uL Final   Immature Granulocytes 04/23/2023 0  % Final   Abs Immature Granulocytes 04/23/2023 0.02  0.00 - 0.07 K/uL Final  Performed at [facility], [address], [place], [place] [address]   Sodium 04/23/2023 143  135 - 145 mmol/L Final   Potassium 04/23/2023 4.5  3.5 - 5.1 mmol/L Final   Chloride 04/23/2023 103  98 - 111 mmol/L Final   CO2 04/23/2023 27  22 - 32 mmol/L Final   Glucose, Bld 04/23/2023 40 (LL)  70 - 99 mg/dL Final   Comment: CRITICAL RESULT CALLED TO, READ BACK BY AND VERIFIED WITH N.JEFFERIES RN [phone] M.ALAMANO Glucose reference range applies only to samples taken after fasting for at least 8 hours.    BUN 04/23/2023 7  6 - 20 mg/dL Final   Creatinine, Ser 04/23/2023 0.81  0.61 - 1.24 mg/dL Final   Calcium 08/65/7846 9.8  8.9 - 10.3 mg/dL Final   Total Protein 96/29/5284 7.8  6.5 - 8.1 g/dL Final   Albumin 13/24/4010 5.1 (H)  3.5 - 5.0 g/dL Final   AST 27/25/3664 35  15 - 41 U/L Final   ALT 04/23/2023 26  0 - 44 U/L Final   Alkaline Phosphatase 04/23/2023 92  38 - 126 U/L Final   Total Bilirubin 04/23/2023 0.6  0.3 - 1.2 mg/dL Final   GFR, Estimated 04/23/2023 >60  >60 mL/min Final   Comment: (NOTE) Calculated using the CKD-EPI Creatinine Equation (2021)    Anion gap 04/23/2023 13  5 - 15 Final   Performed at [facility], [address] [place], [place] [address]   Alcohol, Ethyl (B) 04/23/2023 <10  <10 mg/dL Final   Comment: (NOTE) Lowest detectable limit for serum alcohol is 10 mg/dL.  For medical purposes only. Performed at [facility], [address] [place], [place] [address]    Color, Urine 04/23/2023 YELLOW  YELLOW Final   APPearance 04/23/2023 CLEAR  CLEAR Final   Specific Gravity, Urine 04/23/2023 1.020  1.005 - 1.030 Final   pH 04/23/2023 6.0  5.0 - 8.0 Final   Glucose, UA  04/23/2023 NEGATIVE  NEGATIVE mg/dL Final   Hgb urine dipstick 04/23/2023 NEGATIVE  NEGATIVE Final   Bilirubin Urine 04/23/2023 NEGATIVE  NEGATIVE Final   Ketones, ur 04/23/2023 NEGATIVE  NEGATIVE mg/dL Final   Protein, ur 63/87/5643 NEGATIVE  NEGATIVE mg/dL Final   Nitrite 32/95/1884 NEGATIVE  NEGATIVE Final   Leukocytes,Ua 04/23/2023 NEGATIVE  NEGATIVE Final   Performed at [facility], [address] [place], [place] [address]   POC Amphetamine UR 04/23/2023 None Detected (A)  NONE DETECTED (Cut Off Level 1000 ng/mL) Final   POC Secobarbital (BAR) 04/23/2023 None Detected  NONE DETECTED (Cut Off Level 300 ng/mL) Final   POC Buprenorphine (BUP) 04/23/2023 None Detected  NONE DETECTED (Cut Off Level 10 ng/mL) Final   POC Oxazepam (BZO) 04/23/2023 Positive (A)  NONE DETECTED (Cut Off Level 300 ng/mL) Final   POC Cocaine UR 04/23/2023 Positive (A)  NONE DETECTED (Cut Off Level 300 ng/mL) Final   POC Methamphetamine UR 04/23/2023 Positive (A)  NONE DETECTED (Cut Off Level 1000 ng/mL) Final   POC Morphine 04/23/2023 None Detected  NONE DETECTED (Cut Off Level 300 ng/mL) Final   POC Methadone UR 04/23/2023 None Detected  NONE DETECTED (Cut Off Level 300 ng/mL) Final   POC Oxycodone UR 04/23/2023 None Detected  NONE DETECTED (Cut Off Level 100 ng/mL) Final   POC Marijuana UR 04/23/2023 Positive (A)  NONE DETECTED (Cut Off Level 50 ng/mL) Final   Glucose-Capillary 04/23/2023 125 (H)  70 - 99 mg/dL  Final   Glucose reference range applies only to samples taken after fasting for at least 8 hours.  Office Visit on 02/27/2023  Component Date Value Ref Range Status   HIV 1&2 Ab, 4th Generation 02/27/2023 NON-REACTIVE  NON-REACTIVE Final   Comment: HIV-1 antigen and HIV-1/HIV-2 antibodies were not detected. There is no laboratory evidence of HIV infection. Marland Kitchen PLEASE NOTE: This information has been disclosed to you from records whose confidentiality may be protected by state law.  If your state  requires such protection, then the state law prohibits you from making any further disclosure of the information without the specific written consent of the person to whom it pertains, or as otherwise permitted by law. A general authorization for the release of medical or other information is NOT sufficient for this purpose. . For additional information please refer to http://education.questdiagn[employer].com/faq/FAQ106 (This link is being provided for informational/ educational purposes only.) . Marland Kitchen The performance of this assay has not been clinically validated in patients less than 109 years old. .    Hep B S Ab 02/27/2023 NON-REACTIVE  NON-REACTIVE Final   Hep B Core Total Ab 02/27/2023 NON-REACTIVE  NON-REACTIVE Final   Comment: . For additional information, please refer to  http://education.quest[facility].com/faq/FAQ202  (This link is being provided for informational/ educational purposes only.) .    Hepatitis B Surface Ag 02/27/2023 NON-REACTIVE  NON-REACTIVE Final   Comment: . For additional information, please refer to  http://education.questdiagn[facility].com/faq/FAQ202  (This link is being provided for informational/ educational purposes only.) .    Hepatitis C Ab 02/27/2023 NON-REACTIVE  NON-REACTIVE Final   Comment: . HCV antibody was non-reactive. There is no laboratory  evidence of HCV infection. . In most cases, no further action is required. However, if recent HCV exposure is suspected, a test for HCV RNA (test code 78295) is suggested. . For additional information please refer to http://education.[facility].com/faq/FAQ22v1 (This link is being provided for informational/ educational purposes only.) .    HIV 1 RNA Quant 02/27/2023 NOT DETECTED  copies/mL Final   HIV-1 RNA Quant, Log 02/27/2023 NOT DETECTED  Log copies/mL Final   Comment: REFERENCE RANGE:           NOT DETECTED  copies/mL           NOT DETECTED  Log copies/mL . This test was  performed using Real-Time Polymerase Chain Reaction. . Reportable range is 20 to 10,000,000 copies/mL (1.30-7.00 Log copies/mL).    Neisseria Gonorrhea 02/27/2023 Negative   Final   Chlamydia 02/27/2023 Negative   Final   Comment 02/27/2023 Normal Reference Ranger Chlamydia - Negative   Final   Comment 02/27/2023 Normal Reference Range Neisseria Gonorrhea - Negative   Final   RPR Ser Ql 02/27/2023 NON-REACTIVE  NON-REACTIVE Final   Comment: . No laboratory evidence of syphilis. If recent exposure is suspected, submit a new sample in 2-4 weeks. Marland Kitchen    Heterophile, Mono Screen 02/27/2023 NEGATIVE  NEGATIVE Final   EBV VCA IgM 02/27/2023 <36.00  U/mL Final   Comment:       U/mL              Interpretation       ----              --------------       <36.00            Negative       36.00-43.99       Equivocal       >43.99  Positive    EBV VCA IgG 02/27/2023 76.90 (H)  U/mL Final   Comment:        U/mL             Interpretation        ----             --------------        <18.00           Negative        18.00-21.99      Equivocal        >21.99           Positive    EBV NA IgG 02/27/2023 21.60 (H)  U/mL Final   Comment:        U/mL             Interpretation        ----             --------------        <18.00           Negative        18.00-21.99      Equivocal        >21.99           Positive    Interpretation 02/27/2023    Final   Comment: . Suggestive of a recent or past Epstein-Barr virus infection. .    Cytomegalovirus Ab-IgG 02/27/2023 7.10 (H)  U/mL Final   Comment:                            U/mL         Interpretation                            -----         --------------                            <0.60         Negative                            0.60-0.69     Equivocal                            > or = 0.70   Positive . A positive result indicates that the patient has  antibody to CMV. It does not differentiate between an active or past  infection.    CMV IgM 02/27/2023 <30.00  AU/mL Final   Comment:     AU/mL                 Interpretation     -----                 --------------     <30.00                No Antibody Detected     30.00-34.99           Equivocal     > or = 35.00          Antibody Detected . Results from any one IgM assay should not be used as a sole determinant of a current or recent infection.  Because an IgM test can  yield false positive results and  low level IgM antibody may persist for more than 12  months post infection, reliance on a single test result  could be misleading. Acute infection is best diagnosed  by demonstrating the conversion of IgG from negative to  positive. If an acute infection is suspected, consider  obtaining a new specimen and submit for both IgG and IgM  testing in two or more weeks. .    Adenovirus B 02/27/2023 Not Detected  Not Detected Final   Rhinovirus 02/27/2023 Not Detected  Not Detected Final   Influenza A 02/27/2023 Not Detected  Not Detected Final   INFLUENZA A SUBTYPE H1 02/27/2023 Not Detected  Not Detected Final   INFLUENZA A SUBTYPE H3 02/27/2023 Not Detected  Not Detected Final   Influenza B 02/27/2023 Not Detected  Not Detected Final   Metapneumovirus 02/27/2023 Not Detected  Not Detected Final   Respiratory Syncytial Virus A 02/27/2023 Not Detected  Not Detected Final   Respiratory Syncytial Virus B 02/27/2023 Not Detected  Not Detected Final   HUMAN PARAINFLU VIRUS 1 02/27/2023 Not Detected  Not Detected Final   HUMAN PARAINFLU VIRUS 2 02/27/2023 Not Detected  Not Detected Final   HUMAN PARAINFLU VIRUS 3 02/27/2023 Not Detected  Not Detected Final   Comment 02/27/2023 see note   Final   Comment: THIS ASSAY WILL NOT DETECT SARS-CoV-2 (COVID-19) . This test is performed using the [employer]. . . Limitations: A negative result does not rule out respiratory pathogen infection below the sensitivity limit of the assay. The sensitivity  depends on pathogen and sample type. This assay cannot reliably distinguish between Rhinovirus and Enterovirus due to genetic similarities between the viruses. .   Admission on 02/10/2023, Discharged on 02/10/2023  Component Date Value Ref Range Status   HIV Screen 4th Generation wRfx 02/10/2023 Non Reactive  Non Reactive Final   Performed at [facility], [address], [place], [place] [address]   RPR Ser Ql 02/10/2023 NON REACTIVE  NON REACTIVE Final   Performed at [facility], [address], [place], [place] [address]   Spotted Fever Group IgG 02/10/2023 <1:64  Neg:<1:64 Final   Comment: (NOTE) This test was developed and its performance characteristics determined by [facility]. It has not been cleared or approved by the Food and Drug Administration.    Spotted Fever Group IgM 02/10/2023 <1:64  Neg:<1:64 Final   Comment: (NOTE) This test was developed and its performance characteristics determined by [facility]. It has not been cleared or approved by the Food and Drug Administration.    Result Comment 02/10/2023 Comment   Final   Comment: (NOTE) Spotted Fever Group IgG serum endpoint titer of >=1:64 is suggestive of infection at an unknown time and may be a sign of either past infection or early response to a recent infection. Spotted Fever Group IgM titer of >=1:64 is regarded as probable evidence of recent or ongoing infection. A four-fold or greater increase in titer between two serum samples drawn 1-2 weeks apart and tested in parallel is the best serologic indicator of a recent rickettsial infection. Performed At: [facility] [address] [place], [place] [id number] Jolene Schimke MD ON:[id number]    Sodium 02/10/2023 140  135 - 145 mmol/L Final   Potassium 02/10/2023 3.6  3.5 - 5.1 mmol/L Final   Chloride 02/10/2023 104  98 - 111 mmol/L Final   CO2 02/10/2023 26  22 - 32 mmol/L Final   Glucose,  Bld 02/10/2023 118 (H)  70 - 99 mg/dL Final    Glucose reference range applies only to samples taken after fasting for at least 8 hours.   BUN 02/10/2023 11  6 - 20 mg/dL Final   Creatinine, Ser 02/10/2023 0.73  0.61 - 1.24 mg/dL Final   Calcium 95/28/4132 8.9  8.9 - 10.3 mg/dL Final   Total Protein 44/10/270 6.8  6.5 - 8.1 g/dL Final   Albumin 53/66/4403 4.2  3.5 - 5.0 g/dL Final   AST 47/42/5956 16  15 - 41 U/L Final   ALT 02/10/2023 8  0 - 44 U/L Final   Alkaline Phosphatase 02/10/2023 76  38 - 126 U/L Final   Total Bilirubin 02/10/2023 0.9  0.3 - 1.2 mg/dL Final   GFR, Estimated 02/10/2023 >60  >60 mL/min Final   Comment: (NOTE) Calculated using the CKD-EPI Creatinine Equation (2021)    Anion gap 02/10/2023 10  5 - 15 Final   Performed at [facility], [address], [place], [place] [address]   WBC 02/10/2023 8.2  4.0 - 10.5 K/uL Final   RBC 02/10/2023 5.45  4.22 - 5.81 MIL/uL Final   Hemoglobin 02/10/2023 16.6  13.0 - 17.0 g/dL Final   HCT 43/32/9518 47.9  39.0 - 52.0 % Final   MCV 02/10/2023 87.9  80.0 - 100.0 fL Final   MCH 02/10/2023 30.5  26.0 - 34.0 pg Final   MCHC 02/10/2023 34.7  30.0 - 36.0 g/dL Final   RDW 84/16/6063 13.1  11.5 - 15.5 % Final   Platelets 02/10/2023 228  150 - 400 K/uL Final   nRBC 02/10/2023 0.0  0.0 - 0.2 % Final   Neutrophils Relative % 02/10/2023 64  % Final   Neutro Abs 02/10/2023 5.3  1.7 - 7.7 K/uL Final   Lymphocytes Relative 02/10/2023 23  % Final   Lymphs Abs 02/10/2023 1.9  0.7 - 4.0 K/uL Final   Monocytes Relative 02/10/2023 9  % Final   Monocytes Absolute 02/10/2023 0.7  0.1 - 1.0 K/uL Final   Eosinophils Relative 02/10/2023 3  % Final   Eosinophils Absolute 02/10/2023 0.2  0.0 - 0.5 K/uL Final   Basophils Relative 02/10/2023 1  % Final   Basophils Absolute 02/10/2023 0.1  0.0 - 0.1 K/uL Final   Immature Granulocytes 02/10/2023 0  % Final   Abs Immature Granulocytes 02/10/2023 0.02  0.00 - 0.07 K/uL Final   Performed at [facility], [address], [place], [place] [address]  Admission on 02/05/2023, Discharged on 02/05/2023  Component Date Value Ref Range Status   Sodium 02/05/2023 139  135 - 145 mmol/L Final   Potassium 02/05/2023 3.6  3.5 - 5.1 mmol/L Final   Chloride 02/05/2023 102  98 - 111 mmol/L Final   CO2 02/05/2023 29  22 - 32 mmol/L Final   Glucose, Bld 02/05/2023 93  70 - 99 mg/dL Final   Glucose reference range applies only to samples taken after fasting for at least 8 hours.   BUN 02/05/2023 11  6 - 20 mg/dL Final   Creatinine, Ser 02/05/2023 0.89  0.61 - 1.24 mg/dL Final   Calcium 09/32/3557 8.9  8.9 - 10.3 mg/dL Final   GFR, Estimated 02/05/2023 >60  >60 mL/min Final   Comment: (NOTE) Calculated using the CKD-EPI Creatinine Equation (2021)    Anion gap 02/05/2023 8  5 - 15 Final   Performed at [facility], [address] [place], [place] [address]  WBC 02/05/2023 8.3  4.0 - 10.5 K/uL Final   RBC 02/05/2023 5.55  4.22 - 5.81 MIL/uL Final   Hemoglobin 02/05/2023 17.0  13.0 - 17.0 g/dL Final   HCT 57/84/6962 49.4  39.0 - 52.0 % Final   MCV 02/05/2023 89.0  80.0 - 100.0 fL Final   MCH 02/05/2023 30.6  26.0 - 34.0 pg Final   MCHC 02/05/2023 34.4  30.0 - 36.0 g/dL Final   RDW 95/28/4132 13.0  11.5 - 15.5 % Final   Platelets 02/05/2023 277  150 - 400 K/uL Final   nRBC 02/05/2023 0.0  0.0 - 0.2 % Final   Neutrophils Relative % 02/05/2023 60  % Final   Neutro Abs 02/05/2023 5.0  1.7 - 7.7 K/uL Final   Lymphocytes Relative 02/05/2023 29  % Final   Lymphs Abs 02/05/2023 2.4  0.7 - 4.0 K/uL Final   Monocytes Relative 02/05/2023 8  % Final   Monocytes Absolute 02/05/2023 0.7  0.1 - 1.0 K/uL Final   Eosinophils Relative 02/05/2023 2  % Final   Eosinophils Absolute 02/05/2023 0.2  0.0 - 0.5 K/uL Final   Basophils Relative 02/05/2023 1  % Final   Basophils Absolute 02/05/2023 0.0  0.0 - 0.1 K/uL Final   Immature Granulocytes 02/05/2023 0  % Final   Abs Immature Granulocytes 02/05/2023  0.02  0.00 - 0.07 K/uL Final   Performed at [facility], [address] [place], [place] [address]    Blood Alcohol level:  Lab Results  Component Value Date   ETH <10 04/23/2023   ETH 42 (H) 07/29/2022    Metabolic Disorder Labs: No results found for: "HGBA1C", "MPG" No results found for: "PROLACTIN" Lab Results  Component Value Date   CHOL 166 05/14/2020   TRIG 187 (H) 05/14/2020   HDL 68 05/14/2020   CHOLHDL 2.4 05/14/2020   VLDL 37 05/14/2020   LDLCALC 61 05/14/2020    Therapeutic Lab Levels: No results found for: "LITHIUM" No results found for: "VALPROATE" No results found for: "CBMZ"  Physical Findings   AIMS    Flowsheet Row [facility]) [facility]  AIMS Total Score 0      AUDIT    Flowsheet Row ED from 04/24/2023 in [facility] Admission (Discharged) from OP [facility] Ad[facility] (Discharged[facility]  Alcohol Use Disorder Identification Test Final Score (AUDIT) 37 17 17      GAD-7    Flowsheet Row Office Visit from 08/04/2020 in [facility] Care at [facility] Visit from 11/16/2017 in Primary Care at [facility] Visit from 05/17/2017 in Primary Care at [facility] Visit from 06/16/2016 in [facility]  Total GAD-7 Score 20 6 12 18       PHQ2-9    Flowsheet Row ED from 04/24/2023 in [facility] Telemedicine from 10/07/2020 in Primary Care at [facility] Visit from 08/04/2020 in Primary Care at [facility] Visit from 01/30/2020 in Primary Care at [facility] Visit from 08/04/2019 in Primary Care at [facility] Total Score 6 0 2 0 0  PHQ-9 Total Score 23 -- 19 -- --      Flowsheet Row ED from 04/24/2023 in [facility] ED from 04/23/2023 in [facility] ED from 02/17/2023 in [facility] Emergency Department at [facility]  RISK CATEGORY No Risk Moderate Risk No Risk        Musculoskeletal  Strength & Muscle Tone: within normal limits Gait & Station: normal Patient leans: N/A  Psychiatric Specialty Exam  Presentation  General Appearance:  Appropriate for Environment; Casual  Eye Contact: Good  Speech: Clear and Coherent  Speech Volume: Normal  Handedness: Right   Mood and Affect  Mood: Irritable   Affect: Flat   Thought Process  Thought Processes: Coherent; Goal Directed; Linear  Descriptions of Associations:Intact  Orientation:Full (Time, Place and Person)  Thought Content:Logical  Diagnosis of Schizophrenia or Schizoaffective disorder in past: No    Hallucinations: None  Ideas of Reference:None  Suicidal Thoughts: Denies  Homicidal Thoughts:Denies    Sensorium  Memory: Immediate Good; Recent Fair  Judgment: Fair  Insight: Good   Executive Functions  Concentration: Good  Attention Span: Good  Recall: Fair  Fund of Knowledge: Good  Language: Good   Psychomotor Activity  Psychomotor Activity: None   Assets  Assets: Desire for Improvement; Financial Resources/Insurance; Social Support   Sleep  Sleep: Fair  Physical Exam  Physical Exam Constitutional:      Appearance: Normal appearance.  HENT:     Head: Normocephalic and atraumatic.  Pulmonary:     Effort: Pulmonary effort is normal.  Musculoskeletal:     Cervical back: Normal range of motion.  Neurological:     General: No focal deficit present.     Mental Status: He is alert.    Review of Systems  Constitutional:  Negative for diaphoresis.  HENT: Negative.    Eyes: Negative.   Respiratory: Negative.    Cardiovascular: Negative.   Gastrointestinal: Negative.   Genitourinary: Negative.   Musculoskeletal: Negative.   Skin:  Negative for itching and rash.   Neurological:  Negative for tremors.  Psychiatric/Behavioral:  Positive for substance abuse.    Blood pressure 117/87, pulse 66, temperature 97.8 F (36.6 C), temperature source Oral, resp. rate 18, SpO2 99%. There is no height or weight on file to calculate BMI.  Treatment Plan Summary: Justin May is a 27 yo male with a past psychiatric history of anxiety, alcohol use disorder, benzodiazepine dependence, opioid dependence, tobacco use disorder, 2 prior psychiatric hospitalizations and a past medical history of a seizure disorder (not on any medications), rash (suspected psoriasis) who presents to the [facility] to detox. Patient's current depressed mood, hopelessness and passive SI consistent with substance-induced mood disorder and withdrawal from multiple substances including methamphetamine and cocaine. He also has long history of benzodiazepine abuse, most recently using up to 4g a day. Continues on librium taper though anxiety and irritability is increased with stopping BZD and alcohol.    Labs notable for initial glucose 40, 125. CBC Hgb 17.2. CMP Na 143. UA wnl. Ethanol <10. 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 Rx'ed  triamcinolone cream BID, cerave anti-itch cream BID   -continue triamcinolone cream BID PRN  -continue benadryl cream BID PRN   H/o seizure disorder Last seizure reportedly a month ago. Last saw neurology in 2020, seizure was suspected to be in setting of ativan taper and was rx'ed keppra 500 BID. Patient has not been taking -continue to monitor  -referral to PCP/neurology on discharge    Dispo:  Accepted to [facility] for Monday 7/29 at 1:00pm, facility requesting a door to door transfer  Karie Fetch, MD, PGY-2 04/26/2023 9:57 AM

## 2023-04-26 NOTE — ED Notes (Signed)
Pt refused to get out of bed for group, started to become agitated when told it was time for group.

## 2023-04-27 DIAGNOSIS — F1020 Alcohol dependence, uncomplicated: Secondary | ICD-10-CM | POA: Diagnosis not present

## 2023-04-27 MED ORDER — NICOTINE POLACRILEX 2 MG MT GUM
4.0000 mg | CHEWING_GUM | OROMUCOSAL | Status: DC
  Administered 2023-04-27 – 2023-04-29 (×9): 4 mg via ORAL
  Filled 2023-04-27 (×10): qty 2

## 2023-04-27 NOTE — ED Notes (Signed)
Rn verified with provider that they would like the patient to get nicotine gum and nicotine patch. Rn stated that we usually only give one provider states that they want the patient to get both.

## 2023-04-27 NOTE — ED Notes (Signed)
Requested medication for back pain

## 2023-04-27 NOTE — ED Notes (Signed)
Patient states that he will take atarxt when it is due e can have it at 2100.Will notify oncoming nurse.

## 2023-04-27 NOTE — ED Notes (Signed)
Patient in milieu. Environment is secured. Will continue to monitor for safety. 

## 2023-04-27 NOTE — Group Note (Signed)
Group Topic: Wellness  Group Date: 04/27/2023 Start Time: 1955 End Time: 2045 Facilitators: Emmit Pomfret D, NT  Department: [facility]  Number of Participants: 7  Group Focus: relaxation Treatment Modality:  Psychoeducation Interventions utilized were leisure development Purpose: Relaxation  Name: Justin May Date of Birth: June 10, 1996  MR: [id number]    Level of Participation: moderate Quality of Participation: attentive Interactions with others: gave feedback Mood/Affect: appropriate Triggers (if applicable): n/a Cognition: coherent/clear Progress: Significant Response: n/a Plan: follow-up needed  Patients Problems:  Patient Active Problem List   Diagnosis Date Noted   Benzodiazepine abuse, continuous (HCC) 04/24/2023   Rash and other nonspecific skin eruption 02/28/2023   High risk heterosexual behavior 02/28/2023   Encounter to obtain excuse from work 02/28/2023   MDD (major depressive disorder), recurrent episode, severe (HCC) 05/13/2020   Seizure disorder (HCC) 07/24/2019   Substance abuse, binge pattern (HCC) 11/19/2017   Alcohol use disorder, moderate, dependence (HCC)    Alcohol use with alcohol-induced mood disorder (HCC) 07/01/2017   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

## 2023-04-27 NOTE — ED Provider Notes (Addendum)
Behavioral Health Progress Note  Date and Time: 04/27/2023 9:44 AM Name: Justin May MRN:  [id number]  Subjective:  Overnight, HR 58. CIWA 2, 1, 0. Patient is seen laying in bed this AM. Patient reports his sleep was terrible, reports being interrupted by snoring though the trazodone initially helped with the sleep. He reports eating well. He reports some indigestion, discussed that he has as needed medication. He reports some continued tremors and anxiety overnight as his withdrawal symptoms. He discussed the events of last night when he requested to leave AMA. He reports that he was thinking about how he would have to spend the rest of his life in jail and it was an impulsive decision and it got better after talking with his mom and "once I surrendered." He reports "I'm doing this for myself but also for my mom." He reports concern with taking lexapro because he's scared it will be like Klonopin where he will feel like he needs something even though he is aware that lexapro is not as addicting as Klonopin. Discussed that he is on a low dose of lexapro. Addressed his concerns regarding lexapro and patient decided that he will stay on the lexapro by the end of interview. He asks about getting nicotine gum and discussed with patient that he could have the patch and gum. He denies SI/HI/AVH.   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 Prior hospitalization at [facility] was in August 2021 for depression and polysbustance abuse. SI attempt by cutting wrist in 2019   He has prior medication trials of buspar 5 TID, ativan 0.5 BID PRN, remeron 30,  adderall, lexapro 5, zoloft 50, and gabapentin 200 TID. Current home medications are zoloft 25 and klonopin 0.5 TID. Past Medical History: rash (bx suspect psoriasis)  Family History: polysubstance abuse with EtOH and schizophrenia in Maternal Uncle.  Social History: Unemployed. Homeless. Not welcome back in home per mom. Graduated [employer]. Single with no children. He reports he was about to get a job the day before coming in but then he used coke. He reports his mom lives in [place]. He reports legal charges of being on probation in [place] until 2025 for assault and attempted strangulation. He reports upcoming court date in August 11/12 for trespassing and hit and run in [employer].   Additional Social History:                         Sleep: Poor  Appetite:  Fair  Current Medications:  Current Facility-Administered Medications  Medication Dose Route Frequency Provider Last Rate Last Admin   acetaminophen (TYLENOL) tablet 650 mg  650 mg Oral Q6H PRN Peterson Ao, MD   650 mg at 04/25/23 1827   alum & mag hydroxide-simeth (MAALOX/MYLANTA) 200-200-20 MG/5ML suspension 30 mL  30 mL Oral Q4H PRN Peterson Ao, MD       chlordiazePOXIDE (LIBRIUM) capsule 25 mg  25 mg Oral Q6H PRN Karie Fetch, MD       diphenhydrAMINE-zinc acetate (BENADRYL) 2-0.1 % cream   Topical Daily PRN Karie Fetch, MD       escitalopram (LEXAPRO) tablet 10 mg  10 mg Oral Daily Karie Fetch,  MD   10 mg at 04/27/23 1610   triamcinolone cream (KENALOG) 0.1 % cream   Topical BID PRN Nelly Rout, MD   Given at 04/25/23 1819   And   hydrocerin (EUCERIN) cream   Topical BID PRN Nelly Rout, MD   Given at 04/25/23 1819   hydrOXYzine (ATARAX) tablet 25 mg  25 mg Oral TID PRN Peterson Ao, MD   25 mg at 04/25/23 1827   loperamide (IMODIUM) capsule 2-4 mg  2-4 mg Oral PRN Karie Fetch, MD       magnesium hydroxide (MILK OF MAGNESIA) suspension 30 mL  30 mL Oral Daily PRN Peterson Ao, MD       multivitamin with minerals tablet 1 tablet  1 tablet Oral Daily Peterson Ao, MD   1 tablet at 04/27/23 9604   nicotine (NICODERM CQ - dosed in mg/24 hours) patch 21 mg  21 mg Transdermal Daily Karie Fetch, MD   21 mg at 04/27/23 5409   nicotine polacrilex (NICORETTE) gum 4 mg  4 mg Oral Q4H while awake Karie Fetch, MD   4 mg at 04/27/23 0921   ondansetron (ZOFRAN-ODT) disintegrating tablet 4 mg  4 mg Oral Q6H PRN Karie Fetch, MD       traZODone (DESYREL) tablet 50 mg  50 mg Oral QHS PRN Peterson Ao, MD   50 mg at 04/26/23 2114   Current Outpatient Medications  Medication Sig Dispense Refill   gabapentin (NEURONTIN) 300 MG capsule Take 1 capsule (300 mg total) by mouth 3 (three) times daily.     hydrOXYzine (ATARAX) 25 MG tablet Take 1 tablet (25 mg total) by mouth 3 (three) times daily as needed for anxiety.     Multiple Vitamin (MULTIVITAMIN WITH MINERALS) TABS tablet Take 1 tablet by mouth daily.     Naphazoline HCl (CLEAR EYES OP) Place 3-4 drops into both eyes 4 (four) times daily as needed (For eye irritation).     sertraline (ZOLOFT) 25 MG tablet Take 25 mg by mouth daily. (Patient not taking: Reported on 04/23/2023)     thiamine (VITAMIN B-1) 100 MG tablet Take 1 tablet (100 mg total) by mouth daily.     traZODone (DESYREL) 50 MG tablet Take 1 tablet (50 mg total) by mouth at bedtime as needed for sleep.      Labs  Lab Results:  Admission on 04/23/2023, Discharged on 04/24/2023  Component Date Value Ref Range Status   WBC 04/23/2023 5.8  4.0 - 10.5 K/uL Final   RBC 04/23/2023 5.53  4.22 - 5.81 MIL/uL Final   Hemoglobin 04/23/2023 17.2 (H)  13.0 - 17.0 g/dL Final   HCT 81/19/1478 51.2  39.0 - 52.0 % Final   MCV 04/23/2023 92.6  80.0 - 100.0 fL Final   MCH 04/23/2023 31.1  26.0 - 34.0 pg Final   MCHC 04/23/2023 33.6  30.0 - 36.0 g/dL Final   RDW 29/56/2130 13.5  11.5 - 15.5 % Final   Platelets 04/23/2023 287  150 - 400 K/uL Final   nRBC  04/23/2023 0.0  0.0 - 0.2 % Final   Neutrophils Relative % 04/23/2023 56  % Final   Neutro Abs 04/23/2023 3.2  1.7 - 7.7 K/uL Final   Lymphocytes Relative 04/23/2023 26  % Final   Lymphs Abs 04/23/2023 1.5  0.7 - 4.0 K/uL Final   Monocytes Relative 04/23/2023 11  % Final   Monocytes Absolute 04/23/2023 0.7  0.1 - 1.0 K/uL Final   Eosinophils Relative 04/23/2023  6  % Final   Eosinophils Absolute 04/23/2023 0.3  0.0 - 0.5 K/uL Final   Basophils Relative 04/23/2023 1  % Final   Basophils Absolute 04/23/2023 0.1  0.0 - 0.1 K/uL Final   Immature Granulocytes 04/23/2023 0  % Final   Abs Immature Granulocytes 04/23/2023 0.02  0.00 - 0.07 K/uL Final   Performed at [facility], [address] [place], [place] [address]   Sodium 04/23/2023 143  135 - 145 mmol/L Final   Potassium 04/23/2023 4.5  3.5 - 5.1 mmol/L Final   Chloride 04/23/2023 103  98 - 111 mmol/L Final   CO2 04/23/2023 27  22 - 32 mmol/L Final   Glucose, Bld 04/23/2023 40 (LL)  70 - 99 mg/dL Final   Comment: CRITICAL RESULT CALLED TO, READ BACK BY AND VERIFIED WITH N.JEFFERIES RN [id number] M.ALAMANO Glucose reference range applies only to samples taken after fasting for at least 8 hours.    BUN 04/23/2023 7  6 - 20 mg/dL Final   Creatinine, Ser 04/23/2023 0.81  0.61 - 1.24 mg/dL Final   Calcium 78/46/9629 9.8  8.9 - 10.3 mg/dL Final   Total Protein 52/84/1324 7.8  6.5 - 8.1 g/dL Final   Albumin 40/07/2724 5.1 (H)  3.5 - 5.0 g/dL Final   AST 36/64/4034 35  15 - 41 U/L Final   ALT 04/23/2023 26  0 - 44 U/L Final   Alkaline Phosphatase 04/23/2023 92  38 - 126 U/L Final   Total Bilirubin 04/23/2023 0.6  0.3 - 1.2 mg/dL Final   GFR, Estimated 04/23/2023 >60  >60 mL/min Final   Comment: (NOTE) Calculated using the CKD-EPI Creatinine Equation (2021)    Anion gap 04/23/2023 13  5 - 15 Final   Performed at [facility], [address], [place], [place] [address]   Alcohol, Ethyl (B) 04/23/2023 <10  <10 mg/dL Final    Comment: (NOTE) Lowest detectable limit for serum alcohol is 10 mg/dL.  For medical purposes only. Performed at [facility], [address] [place], [place] [address]    Color, Urine 04/23/2023 YELLOW  YELLOW Final   APPearance 04/23/2023 CLEAR  CLEAR Final   Specific Gravity, Urine 04/23/2023 1.020  1.005 - 1.030 Final   pH 04/23/2023 6.0  5.0 - 8.0 Final   Glucose, UA 04/23/2023 NEGATIVE  NEGATIVE mg/dL Final   Hgb urine dipstick 04/23/2023 NEGATIVE  NEGATIVE Final   Bilirubin Urine 04/23/2023 NEGATIVE  NEGATIVE Final   Ketones, ur 04/23/2023 NEGATIVE  NEGATIVE mg/dL Final   Protein, ur 56/43/3295 NEGATIVE  NEGATIVE mg/dL Final   Nitrite 18/84/1660 NEGATIVE  NEGATIVE Final   Leukocytes,Ua 04/23/2023 NEGATIVE  NEGATIVE Final   Performed at [facility], [address], [place], [place] [address]   POC Amphetamine UR 04/23/2023 None Detected (A)  NONE DETECTED (Cut Off Level 1000 ng/mL) Final   POC Secobarbital (BAR) 04/23/2023 None Detected  NONE DETECTED (Cut Off Level 300 ng/mL) Final   POC Buprenorphine (BUP) 04/23/2023 None Detected  NONE DETECTED (Cut Off Level 10 ng/mL) Final   POC Oxazepam (BZO) 04/23/2023 Positive (A)  NONE DETECTED (Cut Off Level 300 ng/mL) Final   POC Cocaine UR 04/23/2023 Positive (A)  NONE DETECTED (Cut Off Level 300 ng/mL) Final   POC Methamphetamine UR 04/23/2023 Positive (A)  NONE DETECTED (Cut Off Level 1000 ng/mL) Final   POC Morphine 04/23/2023 None Detected  NONE DETECTED (Cut Off Level 300 ng/mL) Final  POC Methadone UR 04/23/2023 None Detected  NONE DETECTED (Cut Off Level 300 ng/mL) Final   POC Oxycodone UR 04/23/2023 None Detected  NONE DETECTED (Cut Off Level 100 ng/mL) Final   POC Marijuana UR 04/23/2023 Positive (A)  NONE DETECTED (Cut Off Level 50 ng/mL) Final   Glucose-Capillary 04/23/2023 125 (H)  70 - 99 mg/dL Final   Glucose reference range applies only to samples taken after fasting for at least 8 hours.  Office Visit on  02/27/2023  Component Date Value Ref Range Status   HIV 1&2 Ab, 4th Generation 02/27/2023 NON-REACTIVE  NON-REACTIVE Final   Comment: HIV-1 antigen and HIV-1/HIV-2 antibodies were not detected. There is no laboratory evidence of HIV infection. Marland Kitchen PLEASE NOTE: This information has been disclosed to you from records whose confidentiality may be protected by state law.  If your state requires such protection, then the state law prohibits you from making any further disclosure of the information without the specific written consent of the person to whom it pertains, or as otherwise permitted by law. A general authorization for the release of medical or other information is NOT sufficient for this purpose. . For additional information please refer to http://education.questdiagn[employer].com/faq/FAQ106 (This link is being provided for informational/ educational purposes only.) . Marland Kitchen The performance of this assay has not been clinically validated in patients less than 63 years old. .    Hep B S Ab 02/27/2023 NON-REACTIVE  NON-REACTIVE Final   Hep B Core Total Ab 02/27/2023 NON-REACTIVE  NON-REACTIVE Final   Comment: . For additional information, please refer to  http://education.quest[facility].com/faq/FAQ202  (This link is being provided for informational/ educational purposes only.) .    Hepatitis B Surface Ag 02/27/2023 NON-REACTIVE  NON-REACTIVE Final   Comment: . For additional information, please refer to  http://education.questdiagn[facility].com/faq/FAQ202  (This link is being provided for informational/ educational purposes only.) .    Hepatitis C Ab 02/27/2023 NON-REACTIVE  NON-REACTIVE Final   Comment: . HCV antibody was non-reactive. There is no laboratory  evidence of HCV infection. . In most cases, no further action is required. However, if recent HCV exposure is suspected, a test for HCV RNA (test code 16109) is suggested. . For additional information please refer  to http://education.quest[facility].com/faq/FAQ22v1 (This link is being provided for informational/ educational purposes only.) .    HIV 1 RNA Quant 02/27/2023 NOT DETECTED  copies/mL Final   HIV-1 RNA Quant, Log 02/27/2023 NOT DETECTED  Log copies/mL Final   Comment: REFERENCE RANGE:           NOT DETECTED  copies/mL           NOT DETECTED  Log copies/mL . This test was performed using Real-Time Polymerase Chain Reaction. . Reportable range is 20 to 10,000,000 copies/mL (1.30-7.00 Log copies/mL).    Neisseria Gonorrhea 02/27/2023 Negative   Final   Chlamydia 02/27/2023 Negative   Final   Comment 02/27/2023 Normal Reference Ranger Chlamydia - Negative   Final   Comment 02/27/2023 Normal Reference Range Neisseria Gonorrhea - Negative   Final   RPR Ser Ql 02/27/2023 NON-REACTIVE  NON-REACTIVE Final   Comment: . No laboratory evidence of syphilis. If recent exposure is suspected, submit a new sample in 2-4 weeks. Marland Kitchen    Heterophile, Mono Screen 02/27/2023 NEGATIVE  NEGATIVE Final   EBV VCA IgM 02/27/2023 <36.00  U/mL Final   Comment:       U/mL              Interpretation       ----              --------------       <  36.00            Negative       36.00-43.99       Equivocal       >43.99            Positive    EBV VCA IgG 02/27/2023 76.90 (H)  U/mL Final   Comment:        U/mL             Interpretation        ----             --------------        <18.00           Negative        18.00-21.99      Equivocal        >21.99           Positive    EBV NA IgG 02/27/2023 21.60 (H)  U/mL Final   Comment:        U/mL             Interpretation        ----             --------------        <18.00           Negative        18.00-21.99      Equivocal        >21.99           Positive    Interpretation 02/27/2023    Final   Comment: . Suggestive of a recent or past Epstein-Barr virus infection. .    Cytomegalovirus Ab-IgG 02/27/2023 7.10 (H)  U/mL Final   Comment:                             U/mL         Interpretation                            -----         --------------                            <0.60         Negative                            0.60-0.69     Equivocal                            > or = 0.70   Positive . A positive result indicates that the patient has  antibody to CMV. It does not differentiate between an active or past infection.    CMV IgM 02/27/2023 <30.00  AU/mL Final   Comment:     AU/mL                 Interpretation     -----                 --------------     <30.00                No Antibody Detected     30.00-34.99           Equivocal     >  or = 35.00          Antibody Detected . Results from any one IgM assay should not be used as a sole determinant of a current or recent infection.  Because an IgM test can yield false positive results and  low level IgM antibody may persist for more than 12  months post infection, reliance on a single test result  could be misleading. Acute infection is best diagnosed  by demonstrating the conversion of IgG from negative to  positive. If an acute infection is suspected, consider  obtaining a new specimen and submit for both IgG and IgM  testing in two or more weeks. .    Adenovirus B 02/27/2023 Not Detected  Not Detected Final   Rhinovirus 02/27/2023 Not Detected  Not Detected Final   Influenza A 02/27/2023 Not Detected  Not Detected Final   INFLUENZA A SUBTYPE H1 02/27/2023 Not Detected  Not Detected Final   INFLUENZA A SUBTYPE H3 02/27/2023 Not Detected  Not Detected Final   Influenza B 02/27/2023 Not Detected  Not Detected Final   Metapneumovirus 02/27/2023 Not Detected  Not Detected Final   Respiratory Syncytial Virus A 02/27/2023 Not Detected  Not Detected Final   Respiratory Syncytial Virus B 02/27/2023 Not Detected  Not Detected Final   HUMAN PARAINFLU VIRUS 1 02/27/2023 Not Detected  Not Detected Final   HUMAN PARAINFLU VIRUS 2 02/27/2023 Not Detected  Not Detected Final    HUMAN PARAINFLU VIRUS 3 02/27/2023 Not Detected  Not Detected Final   Comment 02/27/2023 see note   Final   Comment: THIS ASSAY WILL NOT DETECT SARS-CoV-2 (COVID-19) . This test is performed using the [employer]. . . Limitations: A negative result does not rule out respiratory pathogen infection below the sensitivity limit of the assay. The sensitivity depends on pathogen and sample type. This assay cannot reliably distinguish between Rhinovirus and Enterovirus due to genetic similarities between the viruses. .   Admission on 02/10/2023, Discharged on 02/10/2023  Component Date Value Ref Range Status   HIV Screen 4th Generation wRfx 02/10/2023 Non Reactive  Non Reactive Final   Performed at [facility], [address] [place], [place] [address]   RPR Ser Ql 02/10/2023 NON REACTIVE  NON REACTIVE Final   Performed at [facility], [address] [place], [place] [address]   Spotted Fever Group IgG 02/10/2023 <1:64  Neg:<1:64 Final   Comment: (NOTE) This test was developed and its performance characteristics determined by [facility]. It has not been cleared or approved by the Food and Drug Administration.    Spotted Fever Group IgM 02/10/2023 <1:64  Neg:<1:64 Final   Comment: (NOTE) This test was developed and its performance characteristics determined by [facility]. It has not been cleared or approved by the Food and Drug Administration.    Result Comment 02/10/2023 Comment   Final   Comment: (NOTE) Spotted Fever Group IgG serum endpoint titer of >=1:64 is suggestive of infection at an unknown time and may be a sign of either past infection or early response to a recent infection. Spotted Fever Group IgM titer of >=1:64 is regarded as probable evidence of recent or ongoing infection. A four-fold or greater increase in titer between two serum samples drawn 1-2 weeks apart and tested in parallel is the best serologic indicator of a recent rickettsial  infection. Performed At: [facility] [address] [place], [place] [id number] Jolene Schimke MD OZ:[id number]    Sodium 02/10/2023 140  135 - 145 mmol/L Final   Potassium 02/10/2023 3.6  3.5 - 5.1 mmol/L Final   Chloride 02/10/2023 104  98 - 111 mmol/L Final   CO2 02/10/2023 26  22 - 32 mmol/L Final   Glucose, Bld 02/10/2023 118 (H)  70 - 99 mg/dL Final   Glucose reference range applies only to samples taken after fasting for at least 8 hours.   BUN 02/10/2023 11  6 - 20 mg/dL Final   Creatinine, Ser 02/10/2023 0.73  0.61 - 1.24 mg/dL Final   Calcium 16/07/9603 8.9  8.9 - 10.3 mg/dL Final   Total Protein 54/06/8118 6.8  6.5 - 8.1 g/dL Final   Albumin 14/78/2956 4.2  3.5 - 5.0 g/dL Final   AST 21/30/8657 16  15 - 41 U/L Final   ALT 02/10/2023 8  0 - 44 U/L Final   Alkaline Phosphatase 02/10/2023 76  38 - 126 U/L Final   Total Bilirubin 02/10/2023 0.9  0.3 - 1.2 mg/dL Final   GFR, Estimated 02/10/2023 >60  >60 mL/min Final   Comment: (NOTE) Calculated using the CKD-EPI Creatinine Equation (2021)    Anion gap 02/10/2023 10  5 - 15 Final   Performed at [facility], [address], [place], [place] [address]   WBC 02/10/2023 8.2  4.0 - 10.5 K/uL Final   RBC 02/10/2023 5.45  4.22 - 5.81 MIL/uL Final   Hemoglobin 02/10/2023 16.6  13.0 - 17.0 g/dL Final   HCT 29/52/8413 47.9  39.0 - 52.0 % Final   MCV 02/10/2023 87.9  80.0 - 100.0 fL Final   MCH 02/10/2023 30.5  26.0 - 34.0 pg Final   MCHC 02/10/2023 34.7  30.0 - 36.0 g/dL Final   RDW 24/40/1027 13.1  11.5 - 15.5 % Final   Platelets 02/10/2023 228  150 - 400 K/uL Final   nRBC 02/10/2023 0.0  0.0 - 0.2 % Final   Neutrophils Relative % 02/10/2023 64  % Final   Neutro Abs 02/10/2023 5.3  1.7 - 7.7 K/uL Final   Lymphocytes Relative 02/10/2023 23  % Final   Lymphs Abs 02/10/2023 1.9  0.7 - 4.0 K/uL Final   Monocytes Relative 02/10/2023 9  % Final   Monocytes Absolute 02/10/2023 0.7  0.1 - 1.0 K/uL Final    Eosinophils Relative 02/10/2023 3  % Final   Eosinophils Absolute 02/10/2023 0.2  0.0 - 0.5 K/uL Final   Basophils Relative 02/10/2023 1  % Final   Basophils Absolute 02/10/2023 0.1  0.0 - 0.1 K/uL Final   Immature Granulocytes 02/10/2023 0  % Final   Abs Immature Granulocytes 02/10/2023 0.02  0.00 - 0.07 K/uL Final   Performed at [facility], [address], [place], [place] [address]  Admission on 02/05/2023, Discharged on 02/05/2023  Component Date Value Ref Range Status   Sodium 02/05/2023 139  135 - 145 mmol/L Final   Potassium 02/05/2023 3.6  3.5 - 5.1 mmol/L Final   Chloride 02/05/2023 102  98 - 111 mmol/L Final   CO2 02/05/2023 29  22 - 32 mmol/L Final   Glucose, Bld 02/05/2023 93  70 - 99 mg/dL Final   Glucose reference range applies only to samples taken after fasting for at least 8 hours.   BUN 02/05/2023 11  6 - 20 mg/dL Final   Creatinine, Ser 02/05/2023 0.89  0.61 - 1.24 mg/dL Final   Calcium 44/11/4740 8.9  8.9 - 10.3 mg/dL Final   GFR, Estimated 02/05/2023 >60  >60  mL/min Final   Comment: (NOTE) Calculated using the CKD-EPI Creatinine Equation (2021)    Anion gap 02/05/2023 8  5 - 15 Final   Performed at [facility], [address] [place], [place] [address]   WBC 02/05/2023 8.3  4.0 - 10.5 K/uL Final   RBC 02/05/2023 5.55  4.22 - 5.81 MIL/uL Final   Hemoglobin 02/05/2023 17.0  13.0 - 17.0 g/dL Final   HCT 29/51/8841 49.4  39.0 - 52.0 % Final   MCV 02/05/2023 89.0  80.0 - 100.0 fL Final   MCH 02/05/2023 30.6  26.0 - 34.0 pg Final   MCHC 02/05/2023 34.4  30.0 - 36.0 g/dL Final   RDW 66/03/3015 13.0  11.5 - 15.5 % Final   Platelets 02/05/2023 277  150 - 400 K/uL Final   nRBC 02/05/2023 0.0  0.0 - 0.2 % Final   Neutrophils Relative % 02/05/2023 60  % Final   Neutro Abs 02/05/2023 5.0  1.7 - 7.7 K/uL Final   Lymphocytes Relative 02/05/2023 29  % Final   Lymphs Abs 02/05/2023 2.4  0.7 - 4.0 K/uL Final   Monocytes Relative  02/05/2023 8  % Final   Monocytes Absolute 02/05/2023 0.7  0.1 - 1.0 K/uL Final   Eosinophils Relative 02/05/2023 2  % Final   Eosinophils Absolute 02/05/2023 0.2  0.0 - 0.5 K/uL Final   Basophils Relative 02/05/2023 1  % Final   Basophils Absolute 02/05/2023 0.0  0.0 - 0.1 K/uL Final   Immature Granulocytes 02/05/2023 0  % Final   Abs Immature Granulocytes 02/05/2023 0.02  0.00 - 0.07 K/uL Final   Performed at [facility], [address] [place], [place] [address]    Blood Alcohol level:  Lab Results  Component Value Date   ETH <10 04/23/2023   ETH 42 (H) 07/29/2022    Metabolic Disorder Labs: No results found for: "HGBA1C", "MPG" No results found for: "PROLACTIN" Lab Results  Component Value Date   CHOL 166 05/14/2020   TRIG 187 (H) 05/14/2020   HDL 68 05/14/2020   CHOLHDL 2.4 05/14/2020   VLDL 37 05/14/2020   LDLCALC 61 05/14/2020    Therapeutic Lab Levels: No results found for: "LITHIUM" No results found for: "VALPROATE" No results found for: "CBMZ"  Physical Findings   AIMS    Flowsheet Row [facility]) [facility]  AIMS Total Score 0      AUDIT    Flowsheet Row ED from 04/24/2023 in [facility] Admission (Discharged) from OP [facility] Ad[facility] (Discharged[facility]  Alcohol Use Disorder Identification Test Final Score (AUDIT) 37 17 17      GAD-7    Flowsheet Row Office Visit from 08/04/2020 in [facility] at [facility] Visit from 11/16/2017 in [facility] at [facility] Visit from 05/17/2017 in Primary Care at [facility] Visit from 06/16/2016 in [facility]  Total GAD-7 Score 20 6 12 18       PHQ2-9    Flowsheet Row ED from 04/24/2023 in [facility] Telemedicine from 10/07/2020 in  Primary Care at [facility] Visit from 08/04/2020 in Primary Care at [facility] Visit from 01/30/2020 in Primary Care at [facility] Visit from 08/04/2019 in Primary Care at [facility] Total Score 6 0 2 0 0  PHQ-9 Total Score 23 --  19 -- --      Flowsheet Row ED from 04/24/2023 in [facility] ED from 04/23/2023 in [facility] ED from 02/17/2023 in [facility]  C-SSRS RISK CATEGORY No Risk Moderate Risk No Risk        Musculoskeletal  Strength & Muscle Tone: within normal limits Gait & Station: normal Patient leans: N/A  Psychiatric Specialty Exam  Presentation  General Appearance:  Appropriate for Environment; Casual  Eye Contact: Good  Speech: Clear and Coherent  Speech Volume: Normal  Handedness: Right   Mood and Affect  Mood: "Pretty good"  Affect: Flat   Thought Process  Thought Processes: Coherent; Goal Directed; Linear  Descriptions of Associations:Intact  Orientation:Full (Time, Place and Person)  Thought Content:Logical  Diagnosis of Schizophrenia or Schizoaffective disorder in past: No    Hallucinations: None  Ideas of Reference:None  Suicidal Thoughts: Denies  Homicidal Thoughts:Denies    Sensorium  Memory: Immediate Good; Recent Fair  Judgment: Fair  Insight: Fair   Art therapist  Concentration: Good  Attention Span: Good  Recall: Fair  Fund of Knowledge: Good  Language: Good   Psychomotor Activity  Psychomotor Activity: None   Assets  Assets: Desire for Improvement; Financial Resources/Insurance; Social Support   Sleep  Sleep: Fair  Physical Exam  Physical Exam Constitutional:      Appearance: Normal appearance.  HENT:     Head: Normocephalic and atraumatic.  Pulmonary:     Effort: Pulmonary effort is normal.  Musculoskeletal:     Cervical back: Normal range of motion.   Neurological:     General: No focal deficit present.     Mental Status: He is alert.    Review of Systems  Constitutional:  Negative for diaphoresis.  HENT: Negative.    Eyes: Negative.   Respiratory: Negative.    Cardiovascular: Negative.   Gastrointestinal:  Positive for heartburn.  Genitourinary: Negative.   Musculoskeletal: Negative.   Skin:  Negative for itching and rash.  Neurological:  Positive for tremors.  Psychiatric/Behavioral:  Positive for substance abuse. The patient is nervous/anxious.    Blood pressure 130/76, pulse (!) 58, temperature 97.8 F (36.6 C), resp. rate 16, SpO2 99%. There is no height or weight on file to calculate BMI.  Treatment Plan Summary: Traevon Lathem is a 27 yo male with a past psychiatric history of anxiety, alcohol use disorder, benzodiazepine dependence, opioid dependence, tobacco use disorder, 2 prior psychiatric hospitalizations and a past medical history of a seizure disorder (not on any medications), rash (suspected psoriasis) who presents to the [facility] to detox. Patient's current depressed mood, hopelessness and passive SI consistent with substance-induced mood disorder and withdrawal from multiple substances including methamphetamine and cocaine. He also has long history of benzodiazepine abuse, most recently using up to 4g a day. Continues on librium taper though anxiety and irritability is increased with stopping BZD and alcohol. Had an impulsive episode 7/26 related to increased anxiety and wanted to leave AMA but advised that he had to sign 72 hour form and he decided against it. Less irritable today after talking with support system. Last dose of librium today.    Labs notable for initial glucose 40, 125. CBC Hgb 17.2. CMP Na 143. UA wnl. Ethanol <10. 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 Rx'ed triamcinolone cream BID, cerave anti-itch cream BID  -continue triamcinolone cream BID PRN  -continue benadryl cream BID PRN   H/o seizure disorder Last seizure reportedly a month ago. Last saw neurology in 2020, seizure was suspected to be in setting of ativan taper and was rx'ed keppra 500 BID. Patient has not been taking -continue to monitor  -referral to PCP/neurology on discharge    Dispo:  Accepted to [facility] for Monday 7/29 at 1:00pm, facility requesting a door to door transfer. Mom can transport patient.   Karie Fetch, MD, PGY-2 04/27/2023 9:45 AM

## 2023-04-27 NOTE — ED Notes (Signed)
Patient  sleeping in no acute stress. RR even and unlabored .Environment secured .Will continue to monitor for safely. 

## 2023-04-27 NOTE — Group Note (Signed)
Group Topic: Communication  Group Date: 04/27/2023 Start Time: 1215 End Time: 1245 Facilitators: Merrie Roof, RN  Department: [facility]  Number of Participants: 6  Group Focus: safety plan Treatment Modality:  Individual Therapy Interventions utilized were assignment Purpose: express feelings  Name: Justin May Date of Birth: [birth year]  MR: [id number]    Level of Participation:  Quality of Participation:  Interactions with others:  Mood/Affect:  Triggers (if applicable):  Cognition:  Progress:  Response:  Plan:  Did not attend group Patients Problems:  Patient Active Problem List   Diagnosis Date Noted   Benzodiazepine abuse, continuous (HCC) 04/24/2023   Rash and other nonspecific skin eruption 02/28/2023   High risk heterosexual behavior 02/28/2023   Encounter to obtain excuse from work 02/28/2023   MDD (major depressive disorder), recurrent episode, severe (HCC) 05/13/2020   Seizure disorder (HCC) 07/24/2019   Substance abuse, binge pattern (HCC) 11/19/2017   Alcohol use disorder, moderate, dependence (HCC)    Alcohol use with alcohol-induced mood disorder (HCC) 07/01/2017   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

## 2023-04-27 NOTE — ED Notes (Signed)
Patient is sleeping. Respirations equal and unlabored, skin warm and dry. No change in assessment or acuity. Routine safety checks conducted according to facility protocol. Will continue to monitor for safety.   

## 2023-04-27 NOTE — ED Notes (Signed)
Pt is in the bed sleeping. Respirations are even and unlabored. No acute distress noted. Will continue to monitor for safety. 

## 2023-04-27 NOTE — ED Notes (Signed)
Patient alert and oriented x 3. Denies SI/HI/AVH. Denies intent or plan to harm self or others. Routine conducted according to faculty protocol. Encourage patient to notify staff with any needs or concerns. Patient verbalized agreement and understanding. Will continue to monitor for safety. 

## 2023-04-27 NOTE — ED Notes (Signed)
Pt is in the Dayroom watching TV. Respirations are even and unlabored. No acute distress noted. Will continue to monitor for safety.  

## 2023-04-27 NOTE — Group Note (Signed)
Group Topic: Balance in Life  Group Date: 04/27/2023 Start Time: 1009 End Time: 1100 Facilitators: Priscille Kluver, NT  Department: [facility]  Number of Participants: 6  Group Focus: feeling awareness/expression Treatment Modality:  Skills Training Interventions utilized were support Purpose: enhance coping skills  Name: WILKIE PACKWOOD Date of Birth: Mar 24, 1996  MR: [id number]    Level of Participation: Did not attend group  Patients Problems:  Patient Active Problem List   Diagnosis Date Noted   Benzodiazepine abuse, continuous (HCC) 04/24/2023   Rash and other nonspecific skin eruption 02/28/2023   High risk heterosexual behavior 02/28/2023   Encounter to obtain excuse from work 02/28/2023   MDD (major depressive disorder), recurrent episode, severe (HCC) 05/13/2020   Seizure disorder (HCC) 07/24/2019   Substance abuse, binge pattern (HCC) 11/19/2017   Alcohol use disorder, moderate, dependence (HCC)    Alcohol use with alcohol-induced mood disorder (HCC) 07/01/2017   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

## 2023-04-28 DIAGNOSIS — F1020 Alcohol dependence, uncomplicated: Secondary | ICD-10-CM | POA: Diagnosis not present

## 2023-04-28 NOTE — ED Notes (Signed)
Pt is in the bed sleeping. Respirations are even and unlabored. No acute distress noted. Will continue to monitor for safety. 

## 2023-04-28 NOTE — ED Provider Notes (Signed)
Behavioral Health Progress Note  Date and Time: 04/28/2023 9:58 AM Name: Justin May MRN:  [id number]  Subjective:   The patient is a 27 year old male with a past psychiatric history of alcohol use disorder and benzodiazepine use disorder, previous psychiatric admissions for suicidal thoughts.  The patient presented to the [facility] behavioral urgent care requesting help with substance use.  He did report suicidal thoughts at admission.  The patient has been accepted at [facility] for Monday and his mother will pick him up and transport him there.  Today on interview the patient denies experiencing any withdrawal symptoms.  He states that he is excited about going to [facility] on Monday.  He denies experiencing any suicidal thoughts or depression.    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 Prior hospitalization at [facility] was in August 2021 for depression and polysbustance abuse. SI attempt by cutting wrist in 2019   He has prior medication trials of buspar 5 TID, ativan 0.5 BID PRN, remeron 30, adderall, lexapro 5, zoloft 50, and gabapentin 200 TID. Current home medications are zoloft 25 and klonopin 0.5 TID. Past Medical History: rash (bx suspect psoriasis)  Family History: polysubstance abuse with EtOH and schizophrenia in Maternal Uncle.  Social History: Unemployed. Homeless. Not welcome back in home per mom. Graduated [employer]. Single with no children. He reports he was about to get a job the day before coming in but then he used coke. He reports his mom lives in [place]. He reports legal charges of being  on probation in [place] until 2025 for assault and attempted strangulation. He reports upcoming court date in August 11/12 for trespassing and hit and run in [employer].   Additional Social History:                         Sleep: Poor  Appetite:  Fair  Current Medications:  Current Facility-Administered Medications  Medication Dose Route Frequency Provider Last Rate Last Admin   acetaminophen (TYLENOL) tablet 650 mg  650 mg Oral Q6H PRN Peterson Ao, MD   650 mg at 04/27/23 1314   alum & mag hydroxide-simeth (MAALOX/MYLANTA) 200-200-20 MG/5ML suspension 30 mL  30 mL Oral Q4H PRN Peterson Ao, MD       diphenhydrAMINE-zinc acetate (BENADRYL) 2-0.1 % cream   Topical Daily PRN Karie Fetch, MD       escitalopram (LEXAPRO) tablet 10 mg  10 mg Oral Daily Karie Fetch, MD   10 mg at 04/28/23 0102   triamcinolone cream (KENALOG) 0.1 % cream   Topical BID PRN Nelly Rout, MD   Given at 04/25/23 1819   And   hydrocerin (EUCERIN) cream   Topical BID PRN Nelly Rout, MD   Given at 04/25/23 1819   hydrOXYzine (ATARAX) tablet 25 mg  25 mg Oral TID PRN Peterson Ao, MD   25 mg at 04/27/23 2113   magnesium hydroxide (MILK OF MAGNESIA) suspension 30 mL  30 mL Oral Daily PRN Peterson Ao, MD       multivitamin with minerals tablet 1 tablet  1 tablet Oral Daily Peterson Ao, MD   1 tablet at 04/28/23 0917   nicotine (  NICODERM CQ - dosed in mg/24 hours) patch 21 mg  21 mg Transdermal Daily Karie Fetch, MD   21 mg at 04/28/23 2130   nicotine polacrilex (NICORETTE) gum 4 mg  4 mg Oral Q4H while awake Karie Fetch, MD   4 mg at 04/28/23 0655   traZODone (DESYREL) tablet 50 mg  50 mg Oral QHS PRN Peterson Ao, MD   50 mg at 04/27/23 2113   Current Outpatient Medications  Medication Sig Dispense Refill   gabapentin (NEURONTIN) 300 MG capsule Take 1 capsule (300 mg total) by mouth 3 (three) times daily.     hydrOXYzine (ATARAX) 25 MG tablet Take 1 tablet  (25 mg total) by mouth 3 (three) times daily as needed for anxiety.     Multiple Vitamin (MULTIVITAMIN WITH MINERALS) TABS tablet Take 1 tablet by mouth daily.     Naphazoline HCl (CLEAR EYES OP) Place 3-4 drops into both eyes 4 (four) times daily as needed (For eye irritation).     sertraline (ZOLOFT) 25 MG tablet Take 25 mg by mouth daily. (Patient not taking: Reported on 04/23/2023)     thiamine (VITAMIN B-1) 100 MG tablet Take 1 tablet (100 mg total) by mouth daily.     traZODone (DESYREL) 50 MG tablet Take 1 tablet (50 mg total) by mouth at bedtime as needed for sleep.      Labs  Lab Results:  Admission on 04/23/2023, Discharged on 04/24/2023  Component Date Value Ref Range Status   WBC 04/23/2023 5.8  4.0 - 10.5 K/uL Final   RBC 04/23/2023 5.53  4.22 - 5.81 MIL/uL Final   Hemoglobin 04/23/2023 17.2 (H)  13.0 - 17.0 g/dL Final   HCT 86/57/8469 51.2  39.0 - 52.0 % Final   MCV 04/23/2023 92.6  80.0 - 100.0 fL Final   MCH 04/23/2023 31.1  26.0 - 34.0 pg Final   MCHC 04/23/2023 33.6  30.0 - 36.0 g/dL Final   RDW 62/95/2841 13.5  11.5 - 15.5 % Final   Platelets 04/23/2023 287  150 - 400 K/uL Final   nRBC 04/23/2023 0.0  0.0 - 0.2 % Final   Neutrophils Relative % 04/23/2023 56  % Final   Neutro Abs 04/23/2023 3.2  1.7 - 7.7 K/uL Final   Lymphocytes Relative 04/23/2023 26  % Final   Lymphs Abs 04/23/2023 1.5  0.7 - 4.0 K/uL Final   Monocytes Relative 04/23/2023 11  % Final   Monocytes Absolute 04/23/2023 0.7  0.1 - 1.0 K/uL Final   Eosinophils Relative 04/23/2023 6  % Final   Eosinophils Absolute 04/23/2023 0.3  0.0 - 0.5 K/uL Final   Basophils Relative 04/23/2023 1  % Final   Basophils Absolute 04/23/2023 0.1  0.0 - 0.1 K/uL Final   Immature Granulocytes 04/23/2023 0  % Final   Abs Immature Granulocytes 04/23/2023 0.02  0.00 - 0.07 K/uL Final   Performed at [facility], [address] [place], [place] [address]   Sodium 04/23/2023 143  135 - 145 mmol/L Final   Potassium  04/23/2023 4.5  3.5 - 5.1 mmol/L Final   Chloride 04/23/2023 103  98 - 111 mmol/L Final   CO2 04/23/2023 27  22 - 32 mmol/L Final   Glucose, Bld 04/23/2023 40 (LL)  70 - 99 mg/dL Final   Comment: CRITICAL RESULT CALLED TO, READ BACK BY AND VERIFIED WITH N.JEFFERIES RN [id number] M.ALAMANO Glucose reference range applies only to samples taken after fasting for at least 8 hours.  BUN 04/23/2023 7  6 - 20 mg/dL Final   Creatinine, Ser 04/23/2023 0.81  0.61 - 1.24 mg/dL Final   Calcium 16/07/9603 9.8  8.9 - 10.3 mg/dL Final   Total Protein 54/06/8118 7.8  6.5 - 8.1 g/dL Final   Albumin 14/78/2956 5.1 (H)  3.5 - 5.0 g/dL Final   AST 21/30/8657 35  15 - 41 U/L Final   ALT 04/23/2023 26  0 - 44 U/L Final   Alkaline Phosphatase 04/23/2023 92  38 - 126 U/L Final   Total Bilirubin 04/23/2023 0.6  0.3 - 1.2 mg/dL Final   GFR, Estimated 04/23/2023 >60  >60 mL/min Final   Comment: (NOTE) Calculated using the CKD-EPI Creatinine Equation (2021)    Anion gap 04/23/2023 13  5 - 15 Final   Performed at [facility], [address], [place], [place] [address]   Alcohol, Ethyl (B) 04/23/2023 <10  <10 mg/dL Final   Comment: (NOTE) Lowest detectable limit for serum alcohol is 10 mg/dL.  For medical purposes only. Performed at [facility], [address], [place], [place] [address]    Color, Urine 04/23/2023 YELLOW  YELLOW Final   APPearance 04/23/2023 CLEAR  CLEAR Final   Specific Gravity, Urine 04/23/2023 1.020  1.005 - 1.030 Final   pH 04/23/2023 6.0  5.0 - 8.0 Final   Glucose, UA 04/23/2023 NEGATIVE  NEGATIVE mg/dL Final   Hgb urine dipstick 04/23/2023 NEGATIVE  NEGATIVE Final   Bilirubin Urine 04/23/2023 NEGATIVE  NEGATIVE Final   Ketones, ur 04/23/2023 NEGATIVE  NEGATIVE mg/dL Final   Protein, ur 41/32/4401 NEGATIVE  NEGATIVE mg/dL Final   Nitrite 02/72/5366 NEGATIVE  NEGATIVE Final   Leukocytes,Ua 04/23/2023 NEGATIVE  NEGATIVE Final   Performed at [facility],  [address], [place], [place] [address]   POC Amphetamine UR 04/23/2023 None Detected (A)  NONE DETECTED (Cut Off Level 1000 ng/mL) Final   POC Secobarbital (BAR) 04/23/2023 None Detected  NONE DETECTED (Cut Off Level 300 ng/mL) Final   POC Buprenorphine (BUP) 04/23/2023 None Detected  NONE DETECTED (Cut Off Level 10 ng/mL) Final   POC Oxazepam (BZO) 04/23/2023 Positive (A)  NONE DETECTED (Cut Off Level 300 ng/mL) Final   POC Cocaine UR 04/23/2023 Positive (A)  NONE DETECTED (Cut Off Level 300 ng/mL) Final   POC Methamphetamine UR 04/23/2023 Positive (A)  NONE DETECTED (Cut Off Level 1000 ng/mL) Final   POC Morphine 04/23/2023 None Detected  NONE DETECTED (Cut Off Level 300 ng/mL) Final   POC Methadone UR 04/23/2023 None Detected  NONE DETECTED (Cut Off Level 300 ng/mL) Final   POC Oxycodone UR 04/23/2023 None Detected  NONE DETECTED (Cut Off Level 100 ng/mL) Final   POC Marijuana UR 04/23/2023 Positive (A)  NONE DETECTED (Cut Off Level 50 ng/mL) Final   Glucose-Capillary 04/23/2023 125 (H)  70 - 99 mg/dL Final   Glucose reference range applies only to samples taken after fasting for at least 8 hours.  Office Visit on 02/27/2023  Component Date Value Ref Range Status   HIV 1&2 Ab, 4th Generation 02/27/2023 NON-REACTIVE  NON-REACTIVE Final   Comment: HIV-1 antigen and HIV-1/HIV-2 antibodies were not detected. There is no laboratory evidence of HIV infection. Marland Kitchen PLEASE NOTE: This information has been disclosed to you from records whose confidentiality may be protected by state law.  If your state requires such protection, then the state law prohibits you from making any further disclosure of the information without the specific written  consent of the person to whom it pertains, or as otherwise permitted by law. A general authorization for the release of medical or other information is NOT sufficient for this purpose. . For additional information please refer  to http://education.[facility].com/faq/FAQ106 (This link is being provided for informational/ educational purposes only.) . Marland Kitchen The performance of this assay has not been clinically validated in patients less than 75 years old. .    Hep B S Ab 02/27/2023 NON-REACTIVE  NON-REACTIVE Final   Hep B Core Total Ab 02/27/2023 NON-REACTIVE  NON-REACTIVE Final   Comment: . For additional information, please refer to  http://education.[facility].com/faq/FAQ202  (This link is being provided for informational/ educational purposes only.) .    Hepatitis B Surface Ag 02/27/2023 NON-REACTIVE  NON-REACTIVE Final   Comment: . For additional information, please refer to  http://education.[facility].com/faq/FAQ202  (This link is being provided for informational/ educational purposes only.) .    Hepatitis C Ab 02/27/2023 NON-REACTIVE  NON-REACTIVE Final   Comment: . HCV antibody was non-reactive. There is no laboratory  evidence of HCV infection. . In most cases, no further action is required. However, if recent HCV exposure is suspected, a test for HCV RNA (test code 46962) is suggested. . For additional information please refer to http://education.[facility].com/faq/FAQ22v1 (This link is being provided for informational/ educational purposes only.) .    HIV 1 RNA Quant 02/27/2023 NOT DETECTED  copies/mL Final   HIV-1 RNA Quant, Log 02/27/2023 NOT DETECTED  Log copies/mL Final   Comment: REFERENCE RANGE:           NOT DETECTED  copies/mL           NOT DETECTED  Log copies/mL . This test was performed using Real-Time Polymerase Chain Reaction. . Reportable range is 20 to 10,000,000 copies/mL (1.30-7.00 Log copies/mL).    Neisseria Gonorrhea 02/27/2023 Negative   Final   Chlamydia 02/27/2023 Negative   Final   Comment 02/27/2023 Normal Reference Ranger Chlamydia - Negative   Final   Comment 02/27/2023 Normal Reference Range Neisseria Gonorrhea - Negative    Final   RPR Ser Ql 02/27/2023 NON-REACTIVE  NON-REACTIVE Final   Comment: . No laboratory evidence of syphilis. If recent exposure is suspected, submit a new sample in 2-4 weeks. Marland Kitchen    Heterophile, Mono Screen 02/27/2023 NEGATIVE  NEGATIVE Final   EBV VCA IgM 02/27/2023 <36.00  U/mL Final   Comment:       U/mL              Interpretation       ----              --------------       <36.00            Negative       36.00-43.99       Equivocal       >43.99            Positive    EBV VCA IgG 02/27/2023 76.90 (H)  U/mL Final   Comment:        U/mL             Interpretation        ----             --------------        <18.00           Negative        18.00-21.99      Equivocal        >  21.99           Positive    EBV NA IgG 02/27/2023 21.60 (H)  U/mL Final   Comment:        U/mL             Interpretation        ----             --------------        <18.00           Negative        18.00-21.99      Equivocal        >21.99           Positive    Interpretation 02/27/2023    Final   Comment: . Suggestive of a recent or past Epstein-Barr virus infection. .    Cytomegalovirus Ab-IgG 02/27/2023 7.10 (H)  U/mL Final   Comment:                            U/mL         Interpretation                            -----         --------------                            <0.60         Negative                            0.60-0.69     Equivocal                            > or = 0.70   Positive . A positive result indicates that the patient has  antibody to CMV. It does not differentiate between an active or past infection.    CMV IgM 02/27/2023 <30.00  AU/mL Final   Comment:     AU/mL                 Interpretation     -----                 --------------     <30.00                No Antibody Detected     30.00-34.99           Equivocal     > or = 35.00          Antibody Detected . Results from any one IgM assay should not be used as a sole determinant of a current or recent  infection.  Because an IgM test can yield false positive results and  low level IgM antibody may persist for more than 12  months post infection, reliance on a single test result  could be misleading. Acute infection is best diagnosed  by demonstrating the conversion of IgG from negative to  positive. If an acute infection is suspected, consider  obtaining a new specimen and submit for both IgG and IgM  testing in two or more weeks. .    Adenovirus B 02/27/2023 Not Detected  Not Detected Final   Rhinovirus 02/27/2023 Not Detected  Not Detected Final  Influenza A 02/27/2023 Not Detected  Not Detected Final   INFLUENZA A SUBTYPE H1 02/27/2023 Not Detected  Not Detected Final   INFLUENZA A SUBTYPE H3 02/27/2023 Not Detected  Not Detected Final   Influenza B 02/27/2023 Not Detected  Not Detected Final   Metapneumovirus 02/27/2023 Not Detected  Not Detected Final   Respiratory Syncytial Virus A 02/27/2023 Not Detected  Not Detected Final   Respiratory Syncytial Virus B 02/27/2023 Not Detected  Not Detected Final   HUMAN PARAINFLU VIRUS 1 02/27/2023 Not Detected  Not Detected Final   HUMAN PARAINFLU VIRUS 2 02/27/2023 Not Detected  Not Detected Final   HUMAN PARAINFLU VIRUS 3 02/27/2023 Not Detected  Not Detected Final   Comment 02/27/2023 see note   Final   Comment: THIS ASSAY WILL NOT DETECT SARS-CoV-2 (COVID-19) . This test is performed using the [employer]. . . Limitations: A negative result does not rule out respiratory pathogen infection below the sensitivity limit of the assay. The sensitivity depends on pathogen and sample type. This assay cannot reliably distinguish between Rhinovirus and Enterovirus due to genetic similarities between the viruses. .   Admission on 02/10/2023, Discharged on 02/10/2023  Component Date Value Ref Range Status   HIV Screen 4th Generation wRfx 02/10/2023 Non Reactive  Non Reactive Final   Performed at [facility],  [address] [place], [place] [address]   RPR Ser Ql 02/10/2023 NON REACTIVE  NON REACTIVE Final   Performed at [facility], [address] [place], [place] [address]   Spotted Fever Group IgG 02/10/2023 <1:64  Neg:<1:64 Final   Comment: (NOTE) This test was developed and its performance characteristics determined by [facility]. It has not been cleared or approved by the Food and Drug Administration.    Spotted Fever Group IgM 02/10/2023 <1:64  Neg:<1:64 Final   Comment: (NOTE) This test was developed and its performance characteristics determined by [facility]. It has not been cleared or approved by the Food and Drug Administration.    Result Comment 02/10/2023 Comment   Final   Comment: (NOTE) Spotted Fever Group IgG serum endpoint titer of >=1:64 is suggestive of infection at an unknown time and may be a sign of either past infection or early response to a recent infection. Spotted Fever Group IgM titer of >=1:64 is regarded as probable evidence of recent or ongoing infection. A four-fold or greater increase in titer between two serum samples drawn 1-2 weeks apart and tested in parallel is the best serologic indicator of a recent rickettsial infection. Performed At: [facility] [address] [place], [place] [id number] Jolene Schimke MD [id number]    Sodium 02/10/2023 140  135 - 145 mmol/L Final   Potassium 02/10/2023 3.6  3.5 - 5.1 mmol/L Final   Chloride 02/10/2023 104  98 - 111 mmol/L Final   CO2 02/10/2023 26  22 - 32 mmol/L Final   Glucose, Bld 02/10/2023 118 (H)  70 - 99 mg/dL Final   Glucose reference range applies only to samples taken after fasting for at least 8 hours.   BUN 02/10/2023 11  6 - 20 mg/dL Final   Creatinine, Ser 02/10/2023 0.73  0.61 - 1.24 mg/dL Final   Calcium 32/95/1884 8.9  8.9 - 10.3 mg/dL Final   Total Protein 16/60/6301 6.8  6.5 - 8.1 g/dL Final   Albumin 60/07/9322 4.2  3.5 - 5.0 g/dL Final   AST 55/73/2202 16  15 - 41 U/L Final  ALT 02/10/2023 8  0 - 44 U/L Final   Alkaline Phosphatase 02/10/2023 76  38 - 126 U/L Final   Total Bilirubin 02/10/2023 0.9  0.3 - 1.2 mg/dL Final   GFR, Estimated 02/10/2023 >60  >60 mL/min Final   Comment: (NOTE) Calculated using the CKD-EPI Creatinine Equation (2021)    Anion gap 02/10/2023 10  5 - 15 Final   Performed at [facility], [address] [place], [place] [address]   WBC 02/10/2023 8.2  4.0 - 10.5 K/uL Final   RBC 02/10/2023 5.45  4.22 - 5.81 MIL/uL Final   Hemoglobin 02/10/2023 16.6  13.0 - 17.0 g/dL Final   HCT 86/57/8469 47.9  39.0 - 52.0 % Final   MCV 02/10/2023 87.9  80.0 - 100.0 fL Final   MCH 02/10/2023 30.5  26.0 - 34.0 pg Final   MCHC 02/10/2023 34.7  30.0 - 36.0 g/dL Final   RDW 62/95/2841 13.1  11.5 - 15.5 % Final   Platelets 02/10/2023 228  150 - 400 K/uL Final   nRBC 02/10/2023 0.0  0.0 - 0.2 % Final   Neutrophils Relative % 02/10/2023 64  % Final   Neutro Abs 02/10/2023 5.3  1.7 - 7.7 K/uL Final   Lymphocytes Relative 02/10/2023 23  % Final   Lymphs Abs 02/10/2023 1.9  0.7 - 4.0 K/uL Final   Monocytes Relative 02/10/2023 9  % Final   Monocytes Absolute 02/10/2023 0.7  0.1 - 1.0 K/uL Final   Eosinophils Relative 02/10/2023 3  % Final   Eosinophils Absolute 02/10/2023 0.2  0.0 - 0.5 K/uL Final   Basophils Relative 02/10/2023 1  % Final   Basophils Absolute 02/10/2023 0.1  0.0 - 0.1 K/uL Final   Immature Granulocytes 02/10/2023 0  % Final   Abs Immature Granulocytes 02/10/2023 0.02  0.00 - 0.07 K/uL Final   Performed at [facility], [address] [place], [place] [address]  Admission on 02/05/2023, Discharged on 02/05/2023  Component Date Value Ref Range Status   Sodium 02/05/2023 139  135 - 145 mmol/L Final   Potassium 02/05/2023 3.6  3.5 - 5.1 mmol/L Final   Chloride 02/05/2023 102  98 - 111 mmol/L Final   CO2 02/05/2023 29  22 - 32 mmol/L Final   Glucose, Bld 02/05/2023 93  70 - 99 mg/dL Final    Glucose reference range applies only to samples taken after fasting for at least 8 hours.   BUN 02/05/2023 11  6 - 20 mg/dL Final   Creatinine, Ser 02/05/2023 0.89  0.61 - 1.24 mg/dL Final   Calcium 07/29/2535 8.9  8.9 - 10.3 mg/dL Final   GFR, Estimated 02/05/2023 >60  >60 mL/min Final   Comment: (NOTE) Calculated using the CKD-EPI Creatinine Equation (2021)    Anion gap 02/05/2023 8  5 - 15 Final   Performed at [facility], [address], [place], [place] [address]   WBC 02/05/2023 8.3  4.0 - 10.5 K/uL Final   RBC 02/05/2023 5.55  4.22 - 5.81 MIL/uL Final   Hemoglobin 02/05/2023 17.0  13.0 - 17.0 g/dL Final   HCT 47/42/5956 49.4  39.0 - 52.0 % Final   MCV 02/05/2023 89.0  80.0 - 100.0 fL Final   MCH 02/05/2023 30.6  26.0 - 34.0 pg Final   MCHC 02/05/2023 34.4  30.0 - 36.0 g/dL Final   RDW 38/75/6433 13.0  11.5 - 15.5 % Final   Platelets 02/05/2023 277  150 - 400 K/uL Final  nRBC 02/05/2023 0.0  0.0 - 0.2 % Final   Neutrophils Relative % 02/05/2023 60  % Final   Neutro Abs 02/05/2023 5.0  1.7 - 7.7 K/uL Final   Lymphocytes Relative 02/05/2023 29  % Final   Lymphs Abs 02/05/2023 2.4  0.7 - 4.0 K/uL Final   Monocytes Relative 02/05/2023 8  % Final   Monocytes Absolute 02/05/2023 0.7  0.1 - 1.0 K/uL Final   Eosinophils Relative 02/05/2023 2  % Final   Eosinophils Absolute 02/05/2023 0.2  0.0 - 0.5 K/uL Final   Basophils Relative 02/05/2023 1  % Final   Basophils Absolute 02/05/2023 0.0  0.0 - 0.1 K/uL Final   Immature Granulocytes 02/05/2023 0  % Final   Abs Immature Granulocytes 02/05/2023 0.02  0.00 - 0.07 K/uL Final   Performed at [facility], [address], [place], [place] [address]    Blood Alcohol level:  Lab Results  Component Value Date   ETH <10 04/23/2023   ETH 42 (H) 07/29/2022    Metabolic Disorder Labs: No results found for: "HGBA1C", "MPG" No results found for: "PROLACTIN" Lab Results  Component Value Date   CHOL  166 05/14/2020   TRIG 187 (H) 05/14/2020   HDL 68 05/14/2020   CHOLHDL 2.4 05/14/2020   VLDL 37 05/14/2020   LDLCALC 61 05/14/2020    Therapeutic Lab Levels: No results found for: "LITHIUM" No results found for: "VALPROATE" No results found for: "CBMZ"  Physical Findings   AIMS    Flowsheet Row [facility]) [facility]  AIMS Total Score 0      AUDIT    Flowsheet Row ED from 04/24/2023 in [facility] Admission (Discharged[facility] from OP [facility] Ad[facility] (Discharged[facility]  Alcohol Use Disorder Identification Test Final Score (AUDIT) 37 17 17      GAD-7    Flowsheet Row Office Visit from 08/04/2020 in [facility] at [facility] Visit from 11/16/2017 in Primary Care at [facility] Visit from 05/17/2017 in Primary Care at [facility] Visit from 06/16/2016 in [facility]  Total GAD-7 Score 20 6 12 18       PHQ2-9    Flowsheet Row ED from 04/24/2023 in [facility] Tele[facility]icine from 10/07/2020 in Primary Care at [facility] Visit from 08/04/2020 in Primary Care at [facility] Visit from 01/30/2020 in Primary Care at [facility] Visit from 08/04/2019 in Primary Care at [facility] Total Score 4 0 2 0 0  PHQ-9 Total Score 20 -- 19 -- --      Flowsheet Row ED from 04/24/2023 in [facility] ED from 04/23/2023 in [facility] ED from 02/17/2023 in [facility] Emergency Department at [facility]  C-SSRS RISK CATEGORY No Risk Moderate Risk No Risk        Psychiatric Specialty Exam: Physical Exam Constitutional:      Appearance: the patient is not toxic-appearing.  Pulmonary:     Effort: Pulmonary effort is normal.  Neurological:     General: No focal deficit present.      Mental Status: the patient is alert and oriented to person, place, and time.   Review of Systems  Respiratory:  Negative for shortness of breath.   Cardiovascular:  Negative for chest pain.  Gastrointestinal:  Negative for abdominal pain,  constipation, diarrhea, nausea and vomiting.  Neurological:  Negative for headaches.      BP 110/60 (BP Location: Left Arm)   Pulse (!) 50   Temp 98 F (36.7 C) (Oral)   Resp 17   SpO2 100%   General Appearance: Fairly Groomed  Eye Contact:  Good  Speech:  Clear and Coherent  Volume:  Normal  Mood:  Euthymic  Affect:  Congruent  Thought Process:  Coherent  Orientation:  Full (Time, Place, and Person)  Thought Content: Logical   Suicidal Thoughts:  No  Homicidal Thoughts:  No  Memory:  Immediate;   Good  Judgement:  fair  Insight:  fair  Psychomotor Activity:  Normal  Concentration:  Concentration: Good  Recall:  Good  Fund of Knowledge: Good  Language: Good  Akathisia:  No  Handed:  not assessed  AIMS (if indicated): not done  Assets:  Communication Skills Desire for Improvement Financial Resources/Insurance Housing Leisure Time Physical Health  ADL's:  Intact  Cognition: WNL  Sleep:  Fair     Physical Exam   Blood pressure 110/60, pulse (!) 50, temperature 98 F (36.7 C), temperature source Oral, resp. rate 17, SpO2 100%. There is no height or weight on file to calculate BMI.  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 Rx'ed triamcinolone cream BID, cerave anti-itch cream BID  -continue triamcinolone cream BID PRN  -continue benadryl cream BID PRN   H/o seizure disorder Last seizure reportedly a month ago. Last saw neurology in 2020, seizure was suspected to be in setting of ativan taper and was rx'ed keppra 500 BID. Patient has not been taking -continue to monitor  -referral to PCP/neurology on discharge    Dispo:  Accepted to [facility] for Monday 7/29 at 1:00pm, facility requesting a door to door transfer. Mom can transport patient.   Carlyn Reichert, MD 04/28/2023 9:58 AM

## 2023-04-28 NOTE — ED Notes (Signed)
Pt is in the Dayroom watching TV. Respirations are even and unlabored. No acute distress noted. Will continue to monitor for safety.  

## 2023-04-28 NOTE — Group Note (Signed)
Group Topic: Recovery Basics  Group Date: 04/28/2023 Start Time: 1200 End Time: 1230 Facilitators: Jenean Lindau, RN  Department: [facility]  Number of Participants: 5  Group Focus: chemical dependency education Treatment Modality:  Psychoeducation Interventions utilized were patient education Purpose: enhance coping skills  Name: Justin May Date of Birth: [birth year]  MR: [id number]    Level of Participation: active Quality of Participation: attention seeking Interactions with others: gave feedback Mood/Affect: appropriate Triggers (if applicable):   Cognition: goal directed Progress: Significant Response:   Plan: follow-up needed  Patients Problems:  Patient Active Problem List   Diagnosis Date Noted   Benzodiazepine abuse, continuous (HCC) 04/24/2023   Rash and other nonspecific skin eruption 02/28/2023   High risk heterosexual behavior 02/28/2023   Encounter to obtain excuse from work 02/28/2023   MDD (major depressive disorder), recurrent episode, severe (HCC) 05/13/2020   Seizure disorder (HCC) 07/24/2019   Substance abuse, binge pattern (HCC) 11/19/2017   Alcohol use disorder, moderate, dependence (HCC)    Alcohol use with alcohol-induced mood disorder (HCC) 07/01/2017   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

## 2023-04-28 NOTE — ED Notes (Signed)
Pt is in the bedroom sleeping.  Respirations are even and unlabored. No acute distress noted. Will continue to monitor for safety.  

## 2023-04-28 NOTE — ED Notes (Signed)
Patient remains asleep in bed without issue or complaint.  Will monitor.   

## 2023-04-29 DIAGNOSIS — F1020 Alcohol dependence, uncomplicated: Secondary | ICD-10-CM | POA: Diagnosis not present

## 2023-04-29 MED ORDER — NICOTINE POLACRILEX 2 MG MT GUM
2.0000 mg | CHEWING_GUM | OROMUCOSAL | Status: DC
  Administered 2023-04-29 – 2023-04-30 (×6): 2 mg via ORAL
  Filled 2023-04-29 (×4): qty 1

## 2023-04-29 NOTE — ED Notes (Signed)
Patient is in the Dayroom watching TV. NAD. Respirations even and unlabored.  Will continue to monitor for safety.

## 2023-04-29 NOTE — ED Provider Notes (Signed)
Behavioral Health Progress Note  Date and Time: 04/29/2023 8:39 AM Name: Justin May MRN:  [id number]  Subjective:   The patient is a 27 year old male with a past psychiatric history of alcohol use disorder and benzodiazepine use disorder, previous psychiatric admissions for suicidal thoughts.  The patient presented to the [facility] behavioral urgent care requesting help with substance use.  He did report suicidal thoughts at admission.  The patient has been accepted at [facility] for Monday and his mother will pick him up and transport him there.  Today patient reports he is doing well and is ready to go to [facility].  He reports that his mother will pick him up some time from 10 AM to 12 PM.  The patient reports good mood, appetite, and sleep. He denies suicidal and homicidal thoughts. He denies side effects from his medications.  Review of systems as below.     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 Prior hospitalization at [facility] was in August 2021 for depression and polysbustance abuse. SI attempt by cutting wrist in 2019   He has prior medication trials of buspar 5 TID, ativan 0.5 BID PRN, remeron 30, adderall, lexapro 5, zoloft 50, and gabapentin 200 TID. Current home medications are zoloft 25 and klonopin 0.5 TID. Past Medical History: rash (bx suspect psoriasis)  Family History: polysubstance abuse with EtOH and schizophrenia in Maternal Uncle.  Social History: Unemployed. Homeless. Not welcome back in home per mom. Graduated [employer]. Single with no children. He reports he was about to get a  job the day before coming in but then he used coke. He reports his mom lives in [place]. He reports legal charges of being on probation in [place] until 2025 for assault and attempted strangulation. He reports upcoming court date in August 11/12 for trespassing and hit and run in [employer].   Additional Social History:                         Sleep: Poor  Appetite:  Fair  Current Medications:  Current Facility-Administered Medications  Medication Dose Route Frequency Provider Last Rate Last Admin   acetaminophen (TYLENOL) tablet 650 mg  650 mg Oral Q6H PRN Peterson Ao, MD   650 mg at 04/27/23 1314   alum & mag hydroxide-simeth (MAALOX/MYLANTA) 200-200-20 MG/5ML suspension 30 mL  30 mL Oral Q4H PRN Peterson Ao, MD       diphenhydrAMINE-zinc acetate (BENADRYL) 2-0.1 % cream   Topical Daily PRN Karie Fetch, MD       escitalopram (LEXAPRO) tablet 10 mg  10 mg Oral Daily Karie Fetch, MD   10 mg at 04/28/23 7829   triamcinolone cream (KENALOG) 0.1 % cream   Topical BID PRN Nelly Rout, MD   Given at 04/25/23 1819   And   hydrocerin (EUCERIN) cream   Topical BID PRN Nelly Rout, MD   Given at 04/25/23 1819   hydrOXYzine (ATARAX) tablet 25 mg  25 mg Oral TID PRN Peterson Ao, MD   25 mg at 04/28/23 2109   magnesium hydroxide (MILK OF MAGNESIA) suspension 30 mL  30 mL Oral Daily PRN Peterson Ao, MD  multivitamin with minerals tablet 1 tablet  1 tablet Oral Daily Peterson Ao, MD   1 tablet at 04/28/23 0917   nicotine (NICODERM CQ - dosed in mg/24 hours) patch 21 mg  21 mg Transdermal Daily Karie Fetch, MD   21 mg at 04/28/23 0981   nicotine polacrilex (NICORETTE) gum 2 mg  2 mg Oral Q4H while awake Carlyn Reichert, MD       traZODone (DESYREL) tablet 50 mg  50 mg Oral QHS PRN Peterson Ao, MD   50 mg at 04/28/23 2109   Current Outpatient Medications  Medication Sig Dispense Refill   gabapentin (NEURONTIN) 300 MG capsule Take 1  capsule (300 mg total) by mouth 3 (three) times daily.     hydrOXYzine (ATARAX) 25 MG tablet Take 1 tablet (25 mg total) by mouth 3 (three) times daily as needed for anxiety.     Multiple Vitamin (MULTIVITAMIN WITH MINERALS) TABS tablet Take 1 tablet by mouth daily.     Naphazoline HCl (CLEAR EYES OP) Place 3-4 drops into both eyes 4 (four) times daily as needed (For eye irritation).     sertraline (ZOLOFT) 25 MG tablet Take 25 mg by mouth daily. (Patient not taking: Reported on 04/23/2023)     thiamine (VITAMIN B-1) 100 MG tablet Take 1 tablet (100 mg total) by mouth daily.     traZODone (DESYREL) 50 MG tablet Take 1 tablet (50 mg total) by mouth at bedtime as needed for sleep.      Labs  Lab Results:  Admission on 04/23/2023, Discharged on 04/24/2023  Component Date Value Ref Range Status   WBC 04/23/2023 5.8  4.0 - 10.5 K/uL Final   RBC 04/23/2023 5.53  4.22 - 5.81 MIL/uL Final   Hemoglobin 04/23/2023 17.2 (H)  13.0 - 17.0 g/dL Final   HCT 19/14/7829 51.2  39.0 - 52.0 % Final   MCV 04/23/2023 92.6  80.0 - 100.0 fL Final   MCH 04/23/2023 31.1  26.0 - 34.0 pg Final   MCHC 04/23/2023 33.6  30.0 - 36.0 g/dL Final   RDW 56/21/3086 13.5  11.5 - 15.5 % Final   Platelets 04/23/2023 287  150 - 400 K/uL Final   nRBC 04/23/2023 0.0  0.0 - 0.2 % Final   Neutrophils Relative % 04/23/2023 56  % Final   Neutro Abs 04/23/2023 3.2  1.7 - 7.7 K/uL Final   Lymphocytes Relative 04/23/2023 26  % Final   Lymphs Abs 04/23/2023 1.5  0.7 - 4.0 K/uL Final   Monocytes Relative 04/23/2023 11  % Final   Monocytes Absolute 04/23/2023 0.7  0.1 - 1.0 K/uL Final   Eosinophils Relative 04/23/2023 6  % Final   Eosinophils Absolute 04/23/2023 0.3  0.0 - 0.5 K/uL Final   Basophils Relative 04/23/2023 1  % Final   Basophils Absolute 04/23/2023 0.1  0.0 - 0.1 K/uL Final   Immature Granulocytes 04/23/2023 0  % Final   Abs Immature Granulocytes 04/23/2023 0.02  0.00 - 0.07 K/uL Final   Performed at [facility], [address] [place], [place] [address]   Sodium 04/23/2023 143  135 - 145 mmol/L Final   Potassium 04/23/2023 4.5  3.5 - 5.1 mmol/L Final   Chloride 04/23/2023 103  98 - 111 mmol/L Final   CO2 04/23/2023 27  22 - 32 mmol/L Final   Glucose, Bld 04/23/2023 40 (LL)  70 - 99 mg/dL Final   Comment: CRITICAL RESULT CALLED TO, READ BACK BY AND VERIFIED  WITH N.JEFFERIES RN [phone] 1344 M.ALAMANO Glucose reference range applies only to samples taken after fasting for at least 8 hours.    BUN 04/23/2023 7  6 - 20 mg/dL Final   Creatinine, Ser 04/23/2023 0.81  0.61 - 1.24 mg/dL Final   Calcium 04/54/0981 9.8  8.9 - 10.3 mg/dL Final   Total Protein 19/14/7829 7.8  6.5 - 8.1 g/dL Final   Albumin 56/21/3086 5.1 (H)  3.5 - 5.0 g/dL Final   AST 57/84/6962 35  15 - 41 U/L Final   ALT 04/23/2023 26  0 - 44 U/L Final   Alkaline Phosphatase 04/23/2023 92  38 - 126 U/L Final   Total Bilirubin 04/23/2023 0.6  0.3 - 1.2 mg/dL Final   GFR, Estimated 04/23/2023 >60  >60 mL/min Final   Comment: (NOTE) Calculated using the CKD-EPI Creatinine Equation (2021)    Anion gap 04/23/2023 13  5 - 15 Final   Performed at [facility], [address], [place], [place] [address]   Alcohol, Ethyl (B) 04/23/2023 <10  <10 mg/dL Final   Comment: (NOTE) Lowest detectable limit for serum alcohol is 10 mg/dL.  For medical purposes only. Performed at [facility], [address], [place], [place] [address]    Color, Urine 04/23/2023 YELLOW  YELLOW Final   APPearance 04/23/2023 CLEAR  CLEAR Final   Specific Gravity, Urine 04/23/2023 1.020  1.005 - 1.030 Final   pH 04/23/2023 6.0  5.0 - 8.0 Final   Glucose, UA 04/23/2023 NEGATIVE  NEGATIVE mg/dL Final   Hgb urine dipstick 04/23/2023 NEGATIVE  NEGATIVE Final   Bilirubin Urine 04/23/2023 NEGATIVE  NEGATIVE Final   Ketones, ur 04/23/2023 NEGATIVE  NEGATIVE mg/dL Final   Protein, ur 10/04/7251 NEGATIVE  NEGATIVE mg/dL Final   Nitrite 66/44/0347 NEGATIVE   NEGATIVE Final   Leukocytes,Ua 04/23/2023 NEGATIVE  NEGATIVE Final   Performed at [facility], [address], [place], [place] [address]   POC Amphetamine UR 04/23/2023 None Detected (A)  NONE DETECTED (Cut Off Level 1000 ng/mL) Final   POC Secobarbital (BAR) 04/23/2023 None Detected  NONE DETECTED (Cut Off Level 300 ng/mL) Final   POC Buprenorphine (BUP) 04/23/2023 None Detected  NONE DETECTED (Cut Off Level 10 ng/mL) Final   POC Oxazepam (BZO) 04/23/2023 Positive (A)  NONE DETECTED (Cut Off Level 300 ng/mL) Final   POC Cocaine UR 04/23/2023 Positive (A)  NONE DETECTED (Cut Off Level 300 ng/mL) Final   POC Methamphetamine UR 04/23/2023 Positive (A)  NONE DETECTED (Cut Off Level 1000 ng/mL) Final   POC Morphine 04/23/2023 None Detected  NONE DETECTED (Cut Off Level 300 ng/mL) Final   POC Methadone UR 04/23/2023 None Detected  NONE DETECTED (Cut Off Level 300 ng/mL) Final   POC Oxycodone UR 04/23/2023 None Detected  NONE DETECTED (Cut Off Level 100 ng/mL) Final   POC Marijuana UR 04/23/2023 Positive (A)  NONE DETECTED (Cut Off Level 50 ng/mL) Final   Glucose-Capillary 04/23/2023 125 (H)  70 - 99 mg/dL Final   Glucose reference range applies only to samples taken after fasting for at least 8 hours.  Office Visit on 02/27/2023  Component Date Value Ref Range Status   HIV 1&2 Ab, 4th Generation 02/27/2023 NON-REACTIVE  NON-REACTIVE Final   Comment: HIV-1 antigen and HIV-1/HIV-2 antibodies were not detected. There is no laboratory evidence of HIV infection. Marland Kitchen PLEASE NOTE: This information has been disclosed to you from records whose confidentiality may be protected by state law.  If your state requires such protection, then the state law prohibits you from making any further disclosure of the information without the specific written consent of the person to whom it pertains, or as otherwise permitted by law. A general authorization for the release of medical or other information is  NOT sufficient for this purpose. . For additional information please refer to http://education.questdiagn[facility].com/faq/FAQ106 (This link is being provided for informational/ educational purposes only.) . Marland Kitchen The performance of this assay has not been clinically validated in patients less than 65 years old. .    Hep B S Ab 02/27/2023 NON-REACTIVE  NON-REACTIVE Final   Hep B Core Total Ab 02/27/2023 NON-REACTIVE  NON-REACTIVE Final   Comment: . For additional information, please refer to  http://education.quest[facility].com/faq/FAQ202  (This link is being provided for informational/ educational purposes only.) .    Hepatitis B Surface Ag 02/27/2023 NON-REACTIVE  NON-REACTIVE Final   Comment: . For additional information, please refer to  http://education.[facility].com/faq/FAQ202  (This link is being provided for informational/ educational purposes only.) .    Hepatitis C Ab 02/27/2023 NON-REACTIVE  NON-REACTIVE Final   Comment: . HCV antibody was non-reactive. There is no laboratory  evidence of HCV infection. . In most cases, no further action is required. However, if recent HCV exposure is suspected, a test for HCV RNA (test code 40981) is suggested. . For additional information please refer to http://education.[facility].com/faq/FAQ22v1 (This link is being provided for informational/ educational purposes only.) .    HIV 1 RNA Quant 02/27/2023 NOT DETECTED  copies/mL Final   HIV-1 RNA Quant, Log 02/27/2023 NOT DETECTED  Log copies/mL Final   Comment: REFERENCE RANGE:           NOT DETECTED  copies/mL           NOT DETECTED  Log copies/mL . This test was performed using Real-Time Polymerase Chain Reaction. . Reportable range is 20 to 10,000,000 copies/mL (1.30-7.00 Log copies/mL).    Neisseria Gonorrhea 02/27/2023 Negative   Final   Chlamydia 02/27/2023 Negative   Final   Comment 02/27/2023 Normal Reference Ranger Chlamydia - Negative   Final    Comment 02/27/2023 Normal Reference Range Neisseria Gonorrhea - Negative   Final   RPR Ser Ql 02/27/2023 NON-REACTIVE  NON-REACTIVE Final   Comment: . No laboratory evidence of syphilis. If recent exposure is suspected, submit a new sample in 2-4 weeks. Marland Kitchen    Heterophile, Mono Screen 02/27/2023 NEGATIVE  NEGATIVE Final   EBV VCA IgM 02/27/2023 <36.00  U/mL Final   Comment:       U/mL              Interpretation       ----              --------------       <36.00            Negative       36.00-43.99       Equivocal       >43.99            Positive    EBV VCA IgG 02/27/2023 76.90 (H)  U/mL Final   Comment:        U/mL             Interpretation        ----             --------------        <18.00  Negative        18.00-21.99      Equivocal        >21.99           Positive    EBV NA IgG 02/27/2023 21.60 (H)  U/mL Final   Comment:        U/mL             Interpretation        ----             --------------        <18.00           Negative        18.00-21.99      Equivocal        >21.99           Positive    Interpretation 02/27/2023    Final   Comment: . Suggestive of a recent or past Epstein-Barr virus infection. .    Cytomegalovirus Ab-IgG 02/27/2023 7.10 (H)  U/mL Final   Comment:                            U/mL         Interpretation                            -----         --------------                            <0.60         Negative                            0.60-0.69     Equivocal                            > or = 0.70   Positive . A positive result indicates that the patient has  antibody to CMV. It does not differentiate between an active or past infection.    CMV IgM 02/27/2023 <30.00  AU/mL Final   Comment:     AU/mL                 Interpretation     -----                 --------------     <30.00                No Antibody Detected     30.00-34.99           Equivocal     > or = 35.00          Antibody Detected . Results from any one IgM assay  should not be used as a sole determinant of a current or recent infection.  Because an IgM test can yield false positive results and  low level IgM antibody may persist for more than 12  months post infection, reliance on a single test result  could be misleading. Acute infection is best diagnosed  by demonstrating the conversion of IgG from negative to  positive. If an acute infection is suspected, consider  obtaining a new specimen and submit for both IgG and IgM  testing in two or more weeks. Marland Kitchen  Adenovirus B 02/27/2023 Not Detected  Not Detected Final   Rhinovirus 02/27/2023 Not Detected  Not Detected Final   Influenza A 02/27/2023 Not Detected  Not Detected Final   INFLUENZA A SUBTYPE H1 02/27/2023 Not Detected  Not Detected Final   INFLUENZA A SUBTYPE H3 02/27/2023 Not Detected  Not Detected Final   Influenza B 02/27/2023 Not Detected  Not Detected Final   Metapneumovirus 02/27/2023 Not Detected  Not Detected Final   Respiratory Syncytial Virus A 02/27/2023 Not Detected  Not Detected Final   Respiratory Syncytial Virus B 02/27/2023 Not Detected  Not Detected Final   HUMAN PARAINFLU VIRUS 1 02/27/2023 Not Detected  Not Detected Final   HUMAN PARAINFLU VIRUS 2 02/27/2023 Not Detected  Not Detected Final   HUMAN PARAINFLU VIRUS 3 02/27/2023 Not Detected  Not Detected Final   Comment 02/27/2023 see note   Final   Comment: THIS ASSAY WILL NOT DETECT SARS-CoV-2 (COVID-19) . This test is performed using the [employer]. . . Limitations: A negative result does not rule out respiratory pathogen infection below the sensitivity limit of the assay. The sensitivity depends on pathogen and sample type. This assay cannot reliably distinguish between Rhinovirus and Enterovirus due to genetic similarities between the viruses. .   Admission on 02/10/2023, Discharged on 02/10/2023  Component Date Value Ref Range Status   HIV Screen 4th Generation wRfx 02/10/2023 Non Reactive   Non Reactive Final   Performed at [facility], [address] [place], [place] [address]   RPR Ser Ql 02/10/2023 NON REACTIVE  NON REACTIVE Final   Performed at [facility], [address], [place], [place] [address]   Spotted Fever Group IgG 02/10/2023 <1:64  Neg:<1:64 Final   Comment: (NOTE) This test was developed and its performance characteristics determined by [facility]. It has not been cleared or approved by the Food and Drug Administration.    Spotted Fever Group IgM 02/10/2023 <1:64  Neg:<1:64 Final   Comment: (NOTE) This test was developed and its performance characteristics determined by [facility]. It has not been cleared or approved by the Food and Drug Administration.    Result Comment 02/10/2023 Comment   Final   Comment: (NOTE) Spotted Fever Group IgG serum endpoint titer of >=1:64 is suggestive of infection at an unknown time and may be a sign of either past infection or early response to a recent infection. Spotted Fever Group IgM titer of >=1:64 is regarded as probable evidence of recent or ongoing infection. A four-fold or greater increase in titer between two serum samples drawn 1-2 weeks apart and tested in parallel is the best serologic indicator of a recent rickettsial infection. Performed At: [facility] [address] [place], [place] [id number] Jolene Schimke MD WG:[id number]    Sodium 02/10/2023 140  135 - 145 mmol/L Final   Potassium 02/10/2023 3.6  3.5 - 5.1 mmol/L Final   Chloride 02/10/2023 104  98 - 111 mmol/L Final   CO2 02/10/2023 26  22 - 32 mmol/L Final   Glucose, Bld 02/10/2023 118 (H)  70 - 99 mg/dL Final   Glucose reference range applies only to samples taken after fasting for at least 8 hours.   BUN 02/10/2023 11  6 - 20 mg/dL Final   Creatinine, Ser 02/10/2023 0.73  0.61 - 1.24 mg/dL Final   Calcium 78/46/9629 8.9  8.9 - 10.3 mg/dL Final   Total Protein 52/84/1324 6.8  6.5 - 8.1 g/dL Final   Albumin 40/07/2724  4.2   3.5 - 5.0 g/dL Final   AST 78/29/5621 16  15 - 41 U/L Final   ALT 02/10/2023 8  0 - 44 U/L Final   Alkaline Phosphatase 02/10/2023 76  38 - 126 U/L Final   Total Bilirubin 02/10/2023 0.9  0.3 - 1.2 mg/dL Final   GFR, Estimated 02/10/2023 >60  >60 mL/min Final   Comment: (NOTE) Calculated using the CKD-EPI Creatinine Equation (2021)    Anion gap 02/10/2023 10  5 - 15 Final   Performed at [facility], [address] [place], [place] [address]   WBC 02/10/2023 8.2  4.0 - 10.5 K/uL Final   RBC 02/10/2023 5.45  4.22 - 5.81 MIL/uL Final   Hemoglobin 02/10/2023 16.6  13.0 - 17.0 g/dL Final   HCT 78/46/9629 47.9  39.0 - 52.0 % Final   MCV 02/10/2023 87.9  80.0 - 100.0 fL Final   MCH 02/10/2023 30.5  26.0 - 34.0 pg Final   MCHC 02/10/2023 34.7  30.0 - 36.0 g/dL Final   RDW 52/84/1324 13.1  11.5 - 15.5 % Final   Platelets 02/10/2023 228  150 - 400 K/uL Final   nRBC 02/10/2023 0.0  0.0 - 0.2 % Final   Neutrophils Relative % 02/10/2023 64  % Final   Neutro Abs 02/10/2023 5.3  1.7 - 7.7 K/uL Final   Lymphocytes Relative 02/10/2023 23  % Final   Lymphs Abs 02/10/2023 1.9  0.7 - 4.0 K/uL Final   Monocytes Relative 02/10/2023 9  % Final   Monocytes Absolute 02/10/2023 0.7  0.1 - 1.0 K/uL Final   Eosinophils Relative 02/10/2023 3  % Final   Eosinophils Absolute 02/10/2023 0.2  0.0 - 0.5 K/uL Final   Basophils Relative 02/10/2023 1  % Final   Basophils Absolute 02/10/2023 0.1  0.0 - 0.1 K/uL Final   Immature Granulocytes 02/10/2023 0  % Final   Abs Immature Granulocytes 02/10/2023 0.02  0.00 - 0.07 K/uL Final   Performed at [facility], [address] [place], [place] [address]  Admission on 02/05/2023, Discharged on 02/05/2023  Component Date Value Ref Range Status   Sodium 02/05/2023 139  135 - 145 mmol/L Final   Potassium 02/05/2023 3.6  3.5 - 5.1 mmol/L Final   Chloride 02/05/2023 102  98 - 111 mmol/L Final   CO2 02/05/2023 29  22 - 32 mmol/L Final    Glucose, Bld 02/05/2023 93  70 - 99 mg/dL Final   Glucose reference range applies only to samples taken after fasting for at least 8 hours.   BUN 02/05/2023 11  6 - 20 mg/dL Final   Creatinine, Ser 02/05/2023 0.89  0.61 - 1.24 mg/dL Final   Calcium 72/53/6644 8.9  8.9 - 10.3 mg/dL Final   GFR, Estimated 02/05/2023 >60  >60 mL/min Final   Comment: (NOTE) Calculated using the CKD-EPI Creatinine Equation (2021)    Anion gap 02/05/2023 8  5 - 15 Final   Performed at [facility], [address], [place], [place] [address]   WBC 02/05/2023 8.3  4.0 - 10.5 K/uL Final   RBC 02/05/2023 5.55  4.22 - 5.81 MIL/uL Final   Hemoglobin 02/05/2023 17.0  13.0 - 17.0 g/dL Final   HCT 25/95/6387 49.4  39.0 - 52.0 % Final   MCV 02/05/2023 89.0  80.0 - 100.0 fL Final   MCH 02/05/2023 30.6  26.0 - 34.0 pg Final   MCHC 02/05/2023 34.4  30.0 - 36.0 g/dL Final  RDW 02/05/2023 13.0  11.5 - 15.5 % Final   Platelets 02/05/2023 277  150 - 400 K/uL Final   nRBC 02/05/2023 0.0  0.0 - 0.2 % Final   Neutrophils Relative % 02/05/2023 60  % Final   Neutro Abs 02/05/2023 5.0  1.7 - 7.7 K/uL Final   Lymphocytes Relative 02/05/2023 29  % Final   Lymphs Abs 02/05/2023 2.4  0.7 - 4.0 K/uL Final   Monocytes Relative 02/05/2023 8  % Final   Monocytes Absolute 02/05/2023 0.7  0.1 - 1.0 K/uL Final   Eosinophils Relative 02/05/2023 2  % Final   Eosinophils Absolute 02/05/2023 0.2  0.0 - 0.5 K/uL Final   Basophils Relative 02/05/2023 1  % Final   Basophils Absolute 02/05/2023 0.0  0.0 - 0.1 K/uL Final   Immature Granulocytes 02/05/2023 0  % Final   Abs Immature Granulocytes 02/05/2023 0.02  0.00 - 0.07 K/uL Final   Performed at [facility], [address] [place], [place] [address]    Blood Alcohol level:  Lab Results  Component Value Date   ETH <10 04/23/2023   ETH 42 (H) 07/29/2022    Metabolic Disorder Labs: No results found for: "HGBA1C", "MPG" No results found for:  "PROLACTIN" Lab Results  Component Value Date   CHOL 166 05/14/2020   TRIG 187 (H) 05/14/2020   HDL 68 05/14/2020   CHOLHDL 2.4 05/14/2020   VLDL 37 05/14/2020   LDLCALC 61 05/14/2020    Therapeutic Lab Levels: No results found for: "LITHIUM" No results found for: "VALPROATE" No results found for: "CBMZ"  Physical Findings   AIMS    Flowsheet Row [facility]) [facility]  AIMS Total Score 0      AUDIT    Flowsheet Row ED from 04/24/2023 in [facility] Admission (Discharged[facility] from OP [facility] Ad[facility] (Discharged[facility]  Alcohol Use Disorder Identification Test Final Score (AUDIT) 37 17 17      GAD-7    Flowsheet Row Office Visit from 08/04/2020 in Primary Care at [facility] Visit from 11/16/2017 in Primary Care at [facility] Visit from 05/17/2017 in Primary Care at [facility] Visit from 06/16/2016 in [facility]  Total GAD-7 Score 20 6 12 18       PHQ2-9    Flowsheet Row ED from 04/24/2023 in [facility] Telemedicine from 10/07/2020 in Primary Care at [facility] Visit from 08/04/2020 in Primary Care at [facility] Visit from 01/30/2020 in Primary Care at [facility] Visit from 08/04/2019 in Primary Care at [facility] Total Score 4 0 2 0 0  PHQ-9 Total Score 20 -- 19 -- --      Flowsheet Row ED from 04/24/2023 in [facility] ED from 04/23/2023 in [facility] ED from 02/17/2023 in [facility] Emergency Department at [facility]  C-SSRS RISK CATEGORY No Risk Moderate Risk No Risk        Psychiatric Specialty Exam: Physical Exam Constitutional:      Appearance: the patient is not toxic-appearing.  Pulmonary:     Effort: Pulmonary effort is normal.   Neurological:     General: No focal deficit present.     Mental Status: the patient is alert and oriented to person, place, and time.   Review of Systems  Respiratory:  Negative for shortness of breath.   Cardiovascular:  Negative for chest pain.  Gastrointestinal:  Negative for abdominal pain, constipation, diarrhea, nausea and vomiting.  Neurological:  Negative for headaches.      BP 122/77 (BP Location: Left Arm)   Pulse 75   Temp (!) 97.4 F (36.3 C) (Oral)   Resp 18   SpO2 99%   General Appearance: Fairly Groomed  Eye Contact:  Good  Speech:  Clear and Coherent  Volume:  Normal  Mood:  Euthymic  Affect:  Congruent  Thought Process:  Coherent  Orientation:  Full (Time, Place, and Person)  Thought Content: Logical   Suicidal Thoughts:  No  Homicidal Thoughts:  No  Memory:  Immediate;   Good  Judgement:  fair  Insight:  fair  Psychomotor Activity:  Normal  Concentration:  Concentration: Good  Recall:  Good  Fund of Knowledge: Good  Language: Good  Akathisia:  No  Handed:  not assessed  AIMS (if indicated): not done  Assets:  Communication Skills Desire for Improvement Financial Resources/Insurance Housing Leisure Time Physical Health  ADL's:  Intact  Cognition: WNL  Sleep:  Fair     Physical Exam   Blood pressure 122/77, pulse 75, temperature (!) 97.4 F (36.3 C), temperature source Oral, resp. rate 18, SpO2 99%. There is no height or weight on file to calculate BMI.  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 Rx'ed triamcinolone cream BID, cerave anti-itch cream BID  -continue triamcinolone cream BID PRN  -continue benadryl cream BID PRN   H/o seizure disorder Last seizure reportedly a month ago. Last saw neurology in 2020, seizure was suspected to be in setting of ativan taper and was rx'ed keppra 500 BID. Patient has not been taking -continue to monitor  -referral to PCP/neurology on discharge    Dispo:  Accepted to [facility] for Monday 7/29 at 1:00pm, facility requesting a door to door transfer. Mom can transport patient.   Carlyn Reichert, MD 04/29/2023 8:39 AM

## 2023-04-29 NOTE — Group Note (Signed)
Group Topic: Relaxation  Group Date: 04/29/2023 Start Time: 1300 End Time: 1345 Facilitators: Rico Sheehan, LPN  Department: [facility]  Number of Participants: 3  Group Focus: feeling awareness/expression and relaxation Treatment Modality:  Leisure Counsellor and Patient-Centered Therapy Interventions utilized were exploration, reminiscence, and story telling Purpose: regain self-worth and reinforce self-care  Name: Justin May Date of Birth: [birth year]  MR: [id number]    Level of Participation: active Quality of Participation: cooperative Interactions with others: gave feedback Mood/Affect: appropriate Triggers (if applicable):  Cognition: coherent/clear Progress: Moderate Response:  Plan: follow-up needed  Patients Problems:  Patient Active Problem List   Diagnosis Date Noted   Benzodiazepine abuse, continuous (HCC) 04/24/2023   Rash and other nonspecific skin eruption 02/28/2023   High risk heterosexual behavior 02/28/2023   Encounter to obtain excuse from work 02/28/2023   MDD (major depressive disorder), recurrent episode, severe (HCC) 05/13/2020   Seizure disorder (HCC) 07/24/2019   Substance abuse, binge pattern (HCC) 11/19/2017   Alcohol use disorder, moderate, dependence (HCC)    Alcohol use with alcohol-induced mood disorder (HCC) 07/01/2017   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

## 2023-04-29 NOTE — Group Note (Signed)
Group Topic: Relapse and Recovery  Group Date: 04/28/2023 Start Time: 2000 End Time: 2030 Facilitators: Emmit Pomfret D, NT  Department: [facility]  Number of Participants: 4  Group Focus: relapse prevention Treatment Modality:  Psychoeducation Interventions utilized were support Purpose: relapse prevention strategies  Name: Justin May Date of Birth: 1995-10-18  MR: [id number]    Level of Participation: moderate Quality of Participation: attentive Interactions with others: gave feedback Mood/Affect: appropriate Triggers (if applicable): n/a Cognition: concrete Progress: Significant Response: n/a Plan: follow-up needed  Patients Problems:  Patient Active Problem List   Diagnosis Date Noted   Benzodiazepine abuse, continuous (HCC) 04/24/2023   Rash and other nonspecific skin eruption 02/28/2023   High risk heterosexual behavior 02/28/2023   Encounter to obtain excuse from work 02/28/2023   MDD (major depressive disorder), recurrent episode, severe (HCC) 05/13/2020   Seizure disorder (HCC) 07/24/2019   Substance abuse, binge pattern (HCC) 11/19/2017   Alcohol use disorder, moderate, dependence (HCC)    Alcohol use with alcohol-induced mood disorder (HCC) 07/01/2017   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

## 2023-04-29 NOTE — ED Notes (Signed)
Patient is awake and alert on unit without issue or complaint.  He is social with peers and pleasant on approach.  No evidence of withdrawal at this time.  Will monitor.

## 2023-04-29 NOTE — ED Notes (Signed)
Pt is in the bedroom sleeping.  Respirations are even and unlabored. No acute distress noted. Will continue to monitor for safety.  

## 2023-04-29 NOTE — ED Notes (Signed)
Pt noted making calls to resources provided by Child psychotherapist.

## 2023-04-30 DIAGNOSIS — F4310 Post-traumatic stress disorder, unspecified: Secondary | ICD-10-CM | POA: Diagnosis not present

## 2023-04-30 DIAGNOSIS — F1520 Other stimulant dependence, uncomplicated: Secondary | ICD-10-CM | POA: Diagnosis not present

## 2023-04-30 DIAGNOSIS — G4700 Insomnia, unspecified: Secondary | ICD-10-CM | POA: Diagnosis not present

## 2023-04-30 DIAGNOSIS — F329 Major depressive disorder, single episode, unspecified: Secondary | ICD-10-CM | POA: Diagnosis not present

## 2023-04-30 DIAGNOSIS — F1320 Sedative, hypnotic or anxiolytic dependence, uncomplicated: Secondary | ICD-10-CM | POA: Diagnosis not present

## 2023-04-30 DIAGNOSIS — F1120 Opioid dependence, uncomplicated: Secondary | ICD-10-CM | POA: Diagnosis not present

## 2023-04-30 DIAGNOSIS — F419 Anxiety disorder, unspecified: Secondary | ICD-10-CM | POA: Diagnosis not present

## 2023-04-30 DIAGNOSIS — F1020 Alcohol dependence, uncomplicated: Secondary | ICD-10-CM | POA: Diagnosis not present

## 2023-04-30 DIAGNOSIS — F1420 Cocaine dependence, uncomplicated: Secondary | ICD-10-CM | POA: Diagnosis not present

## 2023-04-30 DIAGNOSIS — B0821 Exanthema subitum [sixth disease] due to human herpesvirus 6: Secondary | ICD-10-CM | POA: Diagnosis not present

## 2023-04-30 DIAGNOSIS — F1220 Cannabis dependence, uncomplicated: Secondary | ICD-10-CM | POA: Diagnosis not present

## 2023-04-30 DIAGNOSIS — F17200 Nicotine dependence, unspecified, uncomplicated: Secondary | ICD-10-CM | POA: Diagnosis not present

## 2023-04-30 MED ORDER — HYDROXYZINE HCL 25 MG PO TABS: 25.0000 mg | ORAL_TABLET | Freq: Three times a day (TID) | ORAL | 0 refills | Status: AC | PRN

## 2023-04-30 MED ORDER — NICOTINE 21 MG/24HR TD PT24: 21.0000 mg | MEDICATED_PATCH | Freq: Every day | TRANSDERMAL | 0 refills | Status: DC

## 2023-04-30 MED ORDER — ESCITALOPRAM OXALATE 10 MG PO TABS: 10.0000 mg | ORAL_TABLET | Freq: Every day | ORAL | 0 refills | Status: DC

## 2023-04-30 MED ORDER — DIPHENHYDRAMINE-ZINC ACETATE 2-0.1 % EX CREA: TOPICAL_CREAM | Freq: Every day | CUTANEOUS | 0 refills | Status: AC | PRN

## 2023-04-30 MED ORDER — TRAZODONE HCL 50 MG PO TABS: 50.0000 mg | ORAL_TABLET | Freq: Every evening | ORAL | 0 refills | Status: DC | PRN

## 2023-04-30 MED ORDER — NICOTINE POLACRILEX 2 MG MT GUM: 2.0000 mg | CHEWING_GUM | OROMUCOSAL | 0 refills | Status: DC

## 2023-04-30 NOTE — Progress Notes (Signed)
Pt is awake, alert and oriented X4. Pt did not voice any complaints of pain or discomfort. No signs of acute distress noted. Administered scheduled meds with no issue. Pt denies current SI/HI/AVH, plan or intent. Staff will monitor for pt's safety. 

## 2023-04-30 NOTE — ED Provider Notes (Signed)
[facility]BC/OBS ASAP Discharge Summary  Date and Time: 04/30/2023 8:55 AM  Name: Justin May  MRN:  [id number]   Discharge Diagnoses:  Final diagnoses:  Alcohol use disorder, severe, dependence (HCC)  Uncomplicated opioid dependence (HCC)  Sedative/hypnotic withdrawal without complication (HCC)  Benzodiazepine dependence, continuous (HCC)  Substance induced mood disorder (HCC)  Tobacco use disorder    Subjective: Patient seen in treatment team room this AM. He reports sleeping well. He reports good appetite. He reports overall mood is good but he still has some anxiety "I feel different, learning how to control my motor function more." He still reports hesitation towards lexapro due to his stepfather having a biased opinion about anti-depressants but is willing to stay on it after some psychoeducation. He denies SI/HI/AVH. He denies medication side effects.   Stay Summary:  During the patient's hospitalization, patient had extensive initial psychiatric evaluation, and follow-up psychiatric evaluations every day.  Psychiatric diagnoses provided upon initial assessment:  -Alcohol use disorder -Benzodiazepine dependence  -Opioid dependence  -Substance-induced mood disorder  Patient's psychiatric medications were adjusted on admission:  -Started on librium taper  -Continued on gabapentin 300 TID   During the hospitalization, other adjustments were made to the patient's psychiatric medication regimen:  -Completed librium taper (7/23-7/26) -Tapered off gabapentin  -Started on lexapro 10  Patient's care was discussed during the interdisciplinary team meeting every day during the hospitalization.  The patient denied having side effects to prescribed psychiatric medication.  Gradually, patient started adjusting to milieu. The patient was evaluated each day by a clinical provider to ascertain response to treatment. Improvement was noted by the patient's report of decreasing symptoms,  improved sleep and appetite, affect, medication tolerance, behavior, and participation in unit programming.  Patient was asked each day to complete a self inventory noting mood, mental status, pain, new symptoms, anxiety and concerns.    Symptoms were reported as significantly decreased or resolved completely by discharge.   On day of discharge, the patient reports that their mood is stable. The patient denied having suicidal thoughts for more than 48 hours prior to discharge.  Patient denies having homicidal thoughts.  Patient denies having auditory hallucinations.  Patient denies any visual hallucinations or other symptoms of psychosis. The patient was motivated to continue taking medication with a goal of continued improvement in mental health.   The patient reports their target psychiatric symptoms of alcohol use disorder responded well to the psychiatric medications, and the patient reports overall benefit other psychiatric hospitalization. Supportive psychotherapy was provided to the patient. The patient also participated in regular group therapy while hospitalized. Coping skills, problem solving as well as relaxation therapies were also part of the unit programming.  Labs were reviewed with the patient, and abnormal results were discussed with the patient.  The patient is able to verbalize their individual safety plan to this provider.  # It is recommended to the patient to continue psychiatric medications as prescribed, after discharge from the hospital.    # It is recommended to the patient to follow up with your outpatient psychiatric provider and PCP.  # It was discussed with the patient, the impact of alcohol, drugs, tobacco have been there overall psychiatric and medical wellbeing, and total abstinence from substance use was recommended the patient.ed.  # Prescriptions provided or sent directly to preferred pharmacy at discharge. Patient agreeable to plan. Given opportunity to ask  questions. Appears to feel comfortable with discharge.    # In the event of worsening symptoms, the  patient is instructed to call the crisis hotline, 911 and or go to the nearest ED for appropriate evaluation and treatment of symptoms. To follow-up with primary care provider for other medical issues, concerns and or health care needs  # Patient was discharged to [facility] with a plan to follow up as noted below.   Total Time spent with patient: 30 minutes  Past Psychiatric History: reported psychiatric history of bipolar and anxiety. Prior hospitalization at [facility] was in August 2021 for depression and polysbustance abuse. SI attempt by cutting wrist in 2019   He has prior medication trials of buspar 5 TID, ativan 0.5 BID PRN, remeron 30, adderall, lexapro 5, zoloft 50, and gabapentin 200 TID. Current home medications are zoloft 25 and klonopin 0.5 TID. Past Medical History: rash (bx suspect psoriasis)  Family History: polysubstance abuse with EtOH and schizophrenia in Maternal Uncle.  Social History: Unemployed. Homeless. Not welcome back in home per mom. Graduated [employer]. Single with no children. He reports he was about to get a job the day before coming in but then he used coke. He reports his mom lives in [place]. He reports legal charges of being on probation in [place] until 2025 for assault and attempted strangulation. He reports upcoming court date in August 11/12 for trespassing and hit and run in [place].  Tobacco Cessation:  A prescription for an FDA-approved tobacco cessation medication provided at discharge  Current Medications:  Current Facility-Administered Medications  Medication Dose Route Frequency Provider Last Rate Last Admin   acetaminophen (TYLENOL) tablet 650 mg  650 mg Oral Q6H PRN Peterson Ao, MD   650 mg at 04/27/23 1314   alum & mag hydroxide-simeth (MAALOX/MYLANTA) 200-200-20 MG/5ML suspension 30 mL  30 mL Oral Q4H PRN Peterson Ao, MD        diphenhydrAMINE-zinc acetate (BENADRYL) 2-0.1 % cream   Topical Daily PRN Karie Fetch, MD       escitalopram (LEXAPRO) tablet 10 mg  10 mg Oral Daily Karie Fetch, MD   10 mg at 04/30/23 4010   triamcinolone cream (KENALOG) 0.1 % cream   Topical BID PRN Nelly Rout, MD   Given at 04/25/23 1819   And   hydrocerin (EUCERIN) cream   Topical BID PRN Nelly Rout, MD   Given at 04/25/23 1819   hydrOXYzine (ATARAX) tablet 25 mg  25 mg Oral TID PRN Peterson Ao, MD   25 mg at 04/29/23 1810   magnesium hydroxide (MILK OF MAGNESIA) suspension 30 mL  30 mL Oral Daily PRN Peterson Ao, MD       multivitamin with minerals tablet 1 tablet  1 tablet Oral Daily Peterson Ao, MD   1 tablet at 04/30/23 2725   nicotine (NICODERM CQ - dosed in mg/24 hours) patch 21 mg  21 mg Transdermal Daily Karie Fetch, MD   21 mg at 04/30/23 3664   nicotine polacrilex (NICORETTE) gum 2 mg  2 mg Oral Q4H while awake Carlyn Reichert, MD   2 mg at 04/30/23 4034   traZODone (DESYREL) tablet 50 mg  50 mg Oral QHS PRN Peterson Ao, MD   50 mg at 04/29/23 2109   Current Outpatient Medications  Medication Sig Dispense Refill   diphenhydrAMINE-zinc acetate (BENADRYL) cream Apply topically daily as needed for itching. 28.4 g 0   [START ON 05/01/2023] escitalopram (LEXAPRO) 10 MG tablet Take 1 tablet (10 mg total) by mouth daily. 30 tablet 0   hydrOXYzine (ATARAX) 25 MG tablet Take 1 tablet (25  mg total) by mouth 3 (three) times daily as needed for anxiety. 30 tablet 0   Multiple Vitamin (MULTIVITAMIN WITH MINERALS) TABS tablet Take 1 tablet by mouth daily.     Naphazoline HCl (CLEAR EYES OP) Place 3-4 drops into both eyes 4 (four) times daily as needed (For eye irritation).     [START ON 05/01/2023] nicotine (NICODERM CQ - DOSED IN MG/24 HOURS) 21 mg/24hr patch Place 1 patch (21 mg total) onto the skin daily. 28 patch 0   nicotine polacrilex (NICORETTE) 2 MG gum Take 1 each (2 mg total) by mouth every 4 (four)  hours while awake. 100 tablet 0   traZODone (DESYREL) 50 MG tablet Take 1 tablet (50 mg total) by mouth at bedtime as needed for sleep. 30 tablet 0    PTA Medications:  Facility Ordered Medications  Medication   acetaminophen (TYLENOL) tablet 650 mg   alum & mag hydroxide-simeth (MAALOX/MYLANTA) 200-200-20 MG/5ML suspension 30 mL   magnesium hydroxide (MILK OF MAGNESIA) suspension 30 mL   hydrOXYzine (ATARAX) tablet 25 mg   multivitamin with minerals tablet 1 tablet   traZODone (DESYREL) tablet 50 mg   [EXPIRED] chlordiazePOXIDE (LIBRIUM) capsule 25 mg   [EXPIRED] loperamide (IMODIUM) capsule 2-4 mg   [EXPIRED] ondansetron (ZOFRAN-ODT) disintegrating tablet 4 mg   [COMPLETED] chlordiazePOXIDE (LIBRIUM) capsule 25 mg   [COMPLETED] nicotine (NICODERM CQ - dosed in mg/24 hours) patch 21 mg   diphenhydrAMINE-zinc acetate (BENADRYL) 2-0.1 % cream   triamcinolone cream (KENALOG) 0.1 % cream   And   hydrocerin (EUCERIN) cream   escitalopram (LEXAPRO) tablet 10 mg   [COMPLETED] gabapentin (NEURONTIN) capsule 300 mg   Followed by   [COMPLETED] gabapentin (NEURONTIN) capsule 300 mg   nicotine (NICODERM CQ - dosed in mg/24 hours) patch 21 mg   [COMPLETED] chlordiazePOXIDE (LIBRIUM) capsule 25 mg   Followed by   [COMPLETED] chlordiazePOXIDE (LIBRIUM) capsule 25 mg   nicotine polacrilex (NICORETTE) gum 2 mg   PTA Medications  Medication Sig   Naphazoline HCl (CLEAR EYES OP) Place 3-4 drops into both eyes 4 (four) times daily as needed (For eye irritation).   Multiple Vitamin (MULTIVITAMIN WITH MINERALS) TABS tablet Take 1 tablet by mouth daily.   [START ON 05/01/2023] escitalopram (LEXAPRO) 10 MG tablet Take 1 tablet (10 mg total) by mouth daily.   hydrOXYzine (ATARAX) 25 MG tablet Take 1 tablet (25 mg total) by mouth 3 (three) times daily as needed for anxiety.   [START ON 05/01/2023] nicotine (NICODERM CQ - DOSED IN MG/24 HOURS) 21 mg/24hr patch Place 1 patch (21 mg total) onto the skin  daily.   nicotine polacrilex (NICORETTE) 2 MG gum Take 1 each (2 mg total) by mouth every 4 (four) hours while awake.   traZODone (DESYREL) 50 MG tablet Take 1 tablet (50 mg total) by mouth at bedtime as needed for sleep.   diphenhydrAMINE-zinc acetate (BENADRYL) cream Apply topically daily as needed for itching.       04/27/2023   10:53 AM 04/24/2023    1:00 PM 10/07/2020    1:43 PM  Depression screen PHQ 2/9  Decreased Interest 2 3 0  Down, Depressed, Hopeless 2 3 0  PHQ - 2 Score 4 6 0  Altered sleeping 3 3   Tired, decreased energy 2 3   Change in appetite 3 3   Feeling bad or failure about yourself  2 3   Trouble concentrating 3 3   Moving slowly or fidgety/restless 3 0  Suicidal thoughts 0 2   PHQ-9 Score 20 23   Difficult doing work/chores Extremely dIfficult      Flowsheet Row ED from 04/24/2023 in [facility] ED from 04/23/2023 in [facility] ED from 02/17/2023 in [facility] Emergency Department at [facility]  C-SSRS RISK CATEGORY No Risk Moderate Risk No Risk       Musculoskeletal  Strength & Muscle Tone: within normal limits Gait & Station: normal Patient leans: N/A  Psychiatric Specialty Exam  Presentation  General Appearance:  Appropriate for Environment; Casual  Eye Contact: Good  Speech: Clear and Coherent  Speech Volume: Normal  Handedness: Right   Mood and Affect  Mood: "Good but still some anxiety"  Affect: Flat; Congruent   Thought Process  Thought Processes: Coherent; Goal Directed; Linear  Descriptions of Associations:Intact  Orientation:Full (Time, Place and Person)  Thought Content:Logical  Diagnosis of Schizophrenia or Schizoaffective disorder in past: No    Hallucinations: None Ideas of Reference:None  Suicidal Thoughts: None Homicidal Thoughts: None  Sensorium  Memory: Immediate Good; Recent Fair  Judgment: Fair  Insight: Good   Executive  Functions  Concentration: Good  Attention Span: Good  Recall: Fair  Fund of Knowledge: Good  Language: Good   Psychomotor Activity  Psychomotor Activity: Normal  Assets  Assets: Desire for Improvement; Financial Resources/Insurance; Social Support   Sleep  Sleep: Fair  Physical Exam  Physical Exam Constitutional:      Appearance: the patient is not toxic-appearing.  Pulmonary:     Effort: Pulmonary effort is normal.  Neurological:     General: No focal deficit present.     Mental Status: the patient is alert and oriented to person, place, and time.   Review of Systems  Respiratory:  Negative for shortness of breath.   Cardiovascular:  Negative for chest pain.  Gastrointestinal:  Negative for abdominal pain, constipation, diarrhea, nausea and vomiting.  Neurological:  Negative for headaches.   Blood pressure 114/76, pulse 63, temperature 97.8 F (36.6 C), temperature source Oral, resp. rate 18, SpO2 100%. There is no height or weight on file to calculate BMI.  Demographic Factors:  Male, Adolescent or young adult, and Unemployed  Loss Factors: Financial problems/change in socioeconomic status  Historical Factors: Impulsivity  Risk Reduction Factors:   Sense of responsibility to family and Positive social support  Continued Clinical Symptoms:  Severe Anxiety and/or Agitation Alcohol/Substance Abuse/Dependencies  Cognitive Features That Contribute To Risk:  Thought constriction (tunnel vision)    Suicide Risk:  Mild: There are no identifiable plans, no associated intent, mild dysphoria and related symptoms, good self-control (both objective and subjective assessment), few other risk factors, and identifiable protective factors, including available and accessible social support.  Plan Of Care/Follow-up recommendations:  Activity: as tolerated  Diet: heart healthy  Other: -Follow-up with your outpatient psychiatric provider -instructions on  appointment date, time, and address (location) are provided to you in discharge paperwork.  -Take your psychiatric medications as prescribed at discharge - instructions are provided to you in the discharge paperwork  -Follow-up with outpatient primary care doctor and other specialists -for management of preventative medicine and chronic medical disease:  -history of seizures (ambulatory referral to neurology) -rash   -Testing: Follow-up with outpatient provider for abnormal lab results: N/A  -If you are prescribed an atypical antipsychotic medication, we recommend that your outpatient psychiatrist follow routine screening for side effects within 3 months of discharge, including monitoring: AIMS scale, height, weight, blood  pressure, fasting lipid panel, HbA1c, and fasting blood sugar.   -Recommend total abstinence from alcohol, tobacco, and other illicit drug use at discharge.   -If your psychiatric symptoms recur, worsen, or if you have side effects to your psychiatric medications, call your outpatient psychiatric provider, 911, [phone] or go to the nearest emergency department.  -If suicidal thoughts occur, immediately call your outpatient psychiatric provider, 911, [phone] or go to the nearest emergency department.   Disposition: [facility] 7/[facility], mom will provide transport   Karie Fetch, MD, PGY-2 04/30/2023, 8:55 AM

## 2023-04-30 NOTE — Discharge Instructions (Addendum)
You have been accepted to [facility]. Please arrive by 1pm.  [address], [place], [place] [address]  For future outpatient appointments, you can come to:  [facility] [address] [place], [place], [address] [phone] phone   New Patient Assessment/Therapy Walk-Ins:  Monday and Wednesday: 8 am until slots are full. Every 1st and 2nd Fridays of the month: 1 pm - 5 pm.  NO ASSESSMENT/THERAPY WALK-INS ON TUESDAYS OR THURSDAYS  New Patient Assessment/Medication Management Walk-Ins:  Monday - Friday:  8 am - 11 am.  For all walk-ins, we ask that you arrive by 7:00 am because patients will be seen in the order of arrival.  Availability is limited; therefore, you may not be seen on the same day that you walk-in.  Our goal is to serve and meet the needs of our community to the best of our ability.    Substance Use Resources for follow up:   [employer] [address] [place] [address] [phone]  [facility] [employer] [phone]  [employer].oxfordvacancies.com  [facility] of [facility] [employer]  [employer]  [facility] [employer] [address] [place], [place]  [address] [phone]  [facility] [address] [place], [place] [address] [phone]  [facility] [address], [place], [place]  [address] [phone]  [facility] [address], [place], [place] [address] [phone]

## 2023-04-30 NOTE — Group Note (Signed)
Group Topic: Fears and Unhealthy Coping Skills  Group Date: 04/29/2023 Start Time: 2000 End Time: 2030 Facilitators: Emmit Pomfret D, NT  Department: [facility]  Number of Participants: 5  Group Focus: acceptance Treatment Modality:  Psychoeducation Interventions utilized were support Purpose: increase insight  Name: Justin May Date of Birth: 03/05/1996  MR: [id number]    Level of Participation: moderate Quality of Participation: attentive Interactions with others: gave feedback Mood/Affect: appropriate Triggers (if applicable): na Cognition: concrete Progress: Significant Response: na Plan: follow-up needed  Patients Problems:  Patient Active Problem List   Diagnosis Date Noted   Benzodiazepine abuse, continuous (HCC) 04/24/2023   Rash and other nonspecific skin eruption 02/28/2023   High risk heterosexual behavior 02/28/2023   Encounter to obtain excuse from work 02/28/2023   MDD (major depressive disorder), recurrent episode, severe (HCC) 05/13/2020   Seizure disorder (HCC) 07/24/2019   Substance abuse, binge pattern (HCC) 11/19/2017   Alcohol use disorder, moderate, dependence (HCC)    Alcohol use with alcohol-induced mood disorder (HCC) 07/01/2017   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

## 2023-04-30 NOTE — ED Notes (Signed)
Patient in the bedroom sleeping. NAD.  Respirations are even and unlabored. Will continue to monitor for safety.

## 2023-04-30 NOTE — Discharge Summary (Signed)
Justin May to be D/C'd Home per MD order. Discussed with the patient and all questions fully answered. An After Visit Summary was printed and given to the patient. Medication scripts were also given to patient. All belongings returned. Patient escorted out and D/C home via private auto.  Dickie La  04/30/2023 10:41 AM

## 2023-05-31 DIAGNOSIS — F4310 Post-traumatic stress disorder, unspecified: Secondary | ICD-10-CM | POA: Diagnosis not present

## 2023-05-31 DIAGNOSIS — F1520 Other stimulant dependence, uncomplicated: Secondary | ICD-10-CM | POA: Diagnosis not present

## 2023-05-31 DIAGNOSIS — F17200 Nicotine dependence, unspecified, uncomplicated: Secondary | ICD-10-CM | POA: Diagnosis not present

## 2023-05-31 DIAGNOSIS — F419 Anxiety disorder, unspecified: Secondary | ICD-10-CM | POA: Diagnosis not present

## 2023-05-31 DIAGNOSIS — F1220 Cannabis dependence, uncomplicated: Secondary | ICD-10-CM | POA: Diagnosis not present

## 2023-05-31 DIAGNOSIS — G4700 Insomnia, unspecified: Secondary | ICD-10-CM | POA: Diagnosis not present

## 2023-05-31 DIAGNOSIS — F1420 Cocaine dependence, uncomplicated: Secondary | ICD-10-CM | POA: Diagnosis not present

## 2023-05-31 DIAGNOSIS — B0821 Exanthema subitum [sixth disease] due to human herpesvirus 6: Secondary | ICD-10-CM | POA: Diagnosis not present

## 2023-05-31 DIAGNOSIS — F1020 Alcohol dependence, uncomplicated: Secondary | ICD-10-CM | POA: Diagnosis not present

## 2023-05-31 DIAGNOSIS — F1320 Sedative, hypnotic or anxiolytic dependence, uncomplicated: Secondary | ICD-10-CM | POA: Diagnosis not present

## 2023-05-31 DIAGNOSIS — F1120 Opioid dependence, uncomplicated: Secondary | ICD-10-CM | POA: Diagnosis not present

## 2023-05-31 DIAGNOSIS — F329 Major depressive disorder, single episode, unspecified: Secondary | ICD-10-CM | POA: Diagnosis not present

## 2023-06-04 DIAGNOSIS — F1320 Sedative, hypnotic or anxiolytic dependence, uncomplicated: Secondary | ICD-10-CM | POA: Diagnosis not present

## 2023-06-04 DIAGNOSIS — F419 Anxiety disorder, unspecified: Secondary | ICD-10-CM | POA: Diagnosis not present

## 2023-06-04 DIAGNOSIS — F329 Major depressive disorder, single episode, unspecified: Secondary | ICD-10-CM | POA: Diagnosis not present

## 2023-06-04 DIAGNOSIS — F17200 Nicotine dependence, unspecified, uncomplicated: Secondary | ICD-10-CM | POA: Diagnosis not present

## 2023-06-04 DIAGNOSIS — B0821 Exanthema subitum [sixth disease] due to human herpesvirus 6: Secondary | ICD-10-CM | POA: Diagnosis not present

## 2023-06-04 DIAGNOSIS — F1420 Cocaine dependence, uncomplicated: Secondary | ICD-10-CM | POA: Diagnosis not present

## 2023-06-04 DIAGNOSIS — F1520 Other stimulant dependence, uncomplicated: Secondary | ICD-10-CM | POA: Diagnosis not present

## 2023-06-04 DIAGNOSIS — F4310 Post-traumatic stress disorder, unspecified: Secondary | ICD-10-CM | POA: Diagnosis not present

## 2023-06-04 DIAGNOSIS — F1120 Opioid dependence, uncomplicated: Secondary | ICD-10-CM | POA: Diagnosis not present

## 2023-06-04 DIAGNOSIS — G4700 Insomnia, unspecified: Secondary | ICD-10-CM | POA: Diagnosis not present

## 2023-06-04 DIAGNOSIS — F1020 Alcohol dependence, uncomplicated: Secondary | ICD-10-CM | POA: Diagnosis not present

## 2023-06-04 DIAGNOSIS — F1220 Cannabis dependence, uncomplicated: Secondary | ICD-10-CM | POA: Diagnosis not present

## 2023-06-05 DIAGNOSIS — F1220 Cannabis dependence, uncomplicated: Secondary | ICD-10-CM | POA: Diagnosis not present

## 2023-06-05 DIAGNOSIS — F1320 Sedative, hypnotic or anxiolytic dependence, uncomplicated: Secondary | ICD-10-CM | POA: Diagnosis not present

## 2023-06-05 DIAGNOSIS — F1420 Cocaine dependence, uncomplicated: Secondary | ICD-10-CM | POA: Diagnosis not present

## 2023-06-05 DIAGNOSIS — F17200 Nicotine dependence, unspecified, uncomplicated: Secondary | ICD-10-CM | POA: Diagnosis not present

## 2023-06-05 DIAGNOSIS — F4310 Post-traumatic stress disorder, unspecified: Secondary | ICD-10-CM | POA: Diagnosis not present

## 2023-06-05 DIAGNOSIS — G4700 Insomnia, unspecified: Secondary | ICD-10-CM | POA: Diagnosis not present

## 2023-06-05 DIAGNOSIS — F1520 Other stimulant dependence, uncomplicated: Secondary | ICD-10-CM | POA: Diagnosis not present

## 2023-06-05 DIAGNOSIS — F1120 Opioid dependence, uncomplicated: Secondary | ICD-10-CM | POA: Diagnosis not present

## 2023-06-05 DIAGNOSIS — F329 Major depressive disorder, single episode, unspecified: Secondary | ICD-10-CM | POA: Diagnosis not present

## 2023-06-05 DIAGNOSIS — F1020 Alcohol dependence, uncomplicated: Secondary | ICD-10-CM | POA: Diagnosis not present

## 2023-06-05 DIAGNOSIS — F419 Anxiety disorder, unspecified: Secondary | ICD-10-CM | POA: Diagnosis not present

## 2023-06-05 DIAGNOSIS — B0821 Exanthema subitum [sixth disease] due to human herpesvirus 6: Secondary | ICD-10-CM | POA: Diagnosis not present

## 2023-06-07 DIAGNOSIS — F4310 Post-traumatic stress disorder, unspecified: Secondary | ICD-10-CM | POA: Diagnosis not present

## 2023-06-07 DIAGNOSIS — F1220 Cannabis dependence, uncomplicated: Secondary | ICD-10-CM | POA: Diagnosis not present

## 2023-06-07 DIAGNOSIS — F1120 Opioid dependence, uncomplicated: Secondary | ICD-10-CM | POA: Diagnosis not present

## 2023-06-07 DIAGNOSIS — F17200 Nicotine dependence, unspecified, uncomplicated: Secondary | ICD-10-CM | POA: Diagnosis not present

## 2023-06-07 DIAGNOSIS — F1520 Other stimulant dependence, uncomplicated: Secondary | ICD-10-CM | POA: Diagnosis not present

## 2023-06-07 DIAGNOSIS — F329 Major depressive disorder, single episode, unspecified: Secondary | ICD-10-CM | POA: Diagnosis not present

## 2023-06-07 DIAGNOSIS — F1420 Cocaine dependence, uncomplicated: Secondary | ICD-10-CM | POA: Diagnosis not present

## 2023-06-07 DIAGNOSIS — F1020 Alcohol dependence, uncomplicated: Secondary | ICD-10-CM | POA: Diagnosis not present

## 2023-06-07 DIAGNOSIS — F419 Anxiety disorder, unspecified: Secondary | ICD-10-CM | POA: Diagnosis not present

## 2023-06-07 DIAGNOSIS — G4700 Insomnia, unspecified: Secondary | ICD-10-CM | POA: Diagnosis not present

## 2023-06-07 DIAGNOSIS — B0821 Exanthema subitum [sixth disease] due to human herpesvirus 6: Secondary | ICD-10-CM | POA: Diagnosis not present

## 2023-06-07 DIAGNOSIS — F1320 Sedative, hypnotic or anxiolytic dependence, uncomplicated: Secondary | ICD-10-CM | POA: Diagnosis not present

## 2023-06-11 DIAGNOSIS — F1520 Other stimulant dependence, uncomplicated: Secondary | ICD-10-CM | POA: Diagnosis not present

## 2023-06-11 DIAGNOSIS — F1420 Cocaine dependence, uncomplicated: Secondary | ICD-10-CM | POA: Diagnosis not present

## 2023-06-11 DIAGNOSIS — F419 Anxiety disorder, unspecified: Secondary | ICD-10-CM | POA: Diagnosis not present

## 2023-06-11 DIAGNOSIS — F329 Major depressive disorder, single episode, unspecified: Secondary | ICD-10-CM | POA: Diagnosis not present

## 2023-06-11 DIAGNOSIS — F1320 Sedative, hypnotic or anxiolytic dependence, uncomplicated: Secondary | ICD-10-CM | POA: Diagnosis not present

## 2023-06-11 DIAGNOSIS — B0821 Exanthema subitum [sixth disease] due to human herpesvirus 6: Secondary | ICD-10-CM | POA: Diagnosis not present

## 2023-06-11 DIAGNOSIS — F1120 Opioid dependence, uncomplicated: Secondary | ICD-10-CM | POA: Diagnosis not present

## 2023-06-11 DIAGNOSIS — F17200 Nicotine dependence, unspecified, uncomplicated: Secondary | ICD-10-CM | POA: Diagnosis not present

## 2023-06-11 DIAGNOSIS — F1020 Alcohol dependence, uncomplicated: Secondary | ICD-10-CM | POA: Diagnosis not present

## 2023-06-11 DIAGNOSIS — F1220 Cannabis dependence, uncomplicated: Secondary | ICD-10-CM | POA: Diagnosis not present

## 2023-06-11 DIAGNOSIS — G4700 Insomnia, unspecified: Secondary | ICD-10-CM | POA: Diagnosis not present

## 2023-06-11 DIAGNOSIS — F4310 Post-traumatic stress disorder, unspecified: Secondary | ICD-10-CM | POA: Diagnosis not present

## 2023-06-12 DIAGNOSIS — F4310 Post-traumatic stress disorder, unspecified: Secondary | ICD-10-CM | POA: Diagnosis not present

## 2023-06-12 DIAGNOSIS — F419 Anxiety disorder, unspecified: Secondary | ICD-10-CM | POA: Diagnosis not present

## 2023-06-12 DIAGNOSIS — F1020 Alcohol dependence, uncomplicated: Secondary | ICD-10-CM | POA: Diagnosis not present

## 2023-06-12 DIAGNOSIS — F1520 Other stimulant dependence, uncomplicated: Secondary | ICD-10-CM | POA: Diagnosis not present

## 2023-06-12 DIAGNOSIS — F1220 Cannabis dependence, uncomplicated: Secondary | ICD-10-CM | POA: Diagnosis not present

## 2023-06-12 DIAGNOSIS — F17200 Nicotine dependence, unspecified, uncomplicated: Secondary | ICD-10-CM | POA: Diagnosis not present

## 2023-06-12 DIAGNOSIS — F329 Major depressive disorder, single episode, unspecified: Secondary | ICD-10-CM | POA: Diagnosis not present

## 2023-06-12 DIAGNOSIS — F1320 Sedative, hypnotic or anxiolytic dependence, uncomplicated: Secondary | ICD-10-CM | POA: Diagnosis not present

## 2023-06-12 DIAGNOSIS — F1420 Cocaine dependence, uncomplicated: Secondary | ICD-10-CM | POA: Diagnosis not present

## 2023-06-12 DIAGNOSIS — G4700 Insomnia, unspecified: Secondary | ICD-10-CM | POA: Diagnosis not present

## 2023-06-12 DIAGNOSIS — F1120 Opioid dependence, uncomplicated: Secondary | ICD-10-CM | POA: Diagnosis not present

## 2023-06-12 DIAGNOSIS — B0821 Exanthema subitum [sixth disease] due to human herpesvirus 6: Secondary | ICD-10-CM | POA: Diagnosis not present

## 2023-06-14 DIAGNOSIS — F1320 Sedative, hypnotic or anxiolytic dependence, uncomplicated: Secondary | ICD-10-CM | POA: Diagnosis not present

## 2023-06-14 DIAGNOSIS — G4700 Insomnia, unspecified: Secondary | ICD-10-CM | POA: Diagnosis not present

## 2023-06-14 DIAGNOSIS — F1020 Alcohol dependence, uncomplicated: Secondary | ICD-10-CM | POA: Diagnosis not present

## 2023-06-14 DIAGNOSIS — F419 Anxiety disorder, unspecified: Secondary | ICD-10-CM | POA: Diagnosis not present

## 2023-06-14 DIAGNOSIS — F1520 Other stimulant dependence, uncomplicated: Secondary | ICD-10-CM | POA: Diagnosis not present

## 2023-06-14 DIAGNOSIS — F329 Major depressive disorder, single episode, unspecified: Secondary | ICD-10-CM | POA: Diagnosis not present

## 2023-06-14 DIAGNOSIS — F4310 Post-traumatic stress disorder, unspecified: Secondary | ICD-10-CM | POA: Diagnosis not present

## 2023-06-14 DIAGNOSIS — F1220 Cannabis dependence, uncomplicated: Secondary | ICD-10-CM | POA: Diagnosis not present

## 2023-06-14 DIAGNOSIS — F1120 Opioid dependence, uncomplicated: Secondary | ICD-10-CM | POA: Diagnosis not present

## 2023-06-14 DIAGNOSIS — F17200 Nicotine dependence, unspecified, uncomplicated: Secondary | ICD-10-CM | POA: Diagnosis not present

## 2023-06-14 DIAGNOSIS — F1420 Cocaine dependence, uncomplicated: Secondary | ICD-10-CM | POA: Diagnosis not present

## 2023-06-14 DIAGNOSIS — B0821 Exanthema subitum [sixth disease] due to human herpesvirus 6: Secondary | ICD-10-CM | POA: Diagnosis not present

## 2023-06-18 DIAGNOSIS — F1420 Cocaine dependence, uncomplicated: Secondary | ICD-10-CM | POA: Diagnosis not present

## 2023-06-18 DIAGNOSIS — F17200 Nicotine dependence, unspecified, uncomplicated: Secondary | ICD-10-CM | POA: Diagnosis not present

## 2023-06-18 DIAGNOSIS — F329 Major depressive disorder, single episode, unspecified: Secondary | ICD-10-CM | POA: Diagnosis not present

## 2023-06-18 DIAGNOSIS — F1120 Opioid dependence, uncomplicated: Secondary | ICD-10-CM | POA: Diagnosis not present

## 2023-06-18 DIAGNOSIS — F419 Anxiety disorder, unspecified: Secondary | ICD-10-CM | POA: Diagnosis not present

## 2023-06-18 DIAGNOSIS — F1220 Cannabis dependence, uncomplicated: Secondary | ICD-10-CM | POA: Diagnosis not present

## 2023-06-18 DIAGNOSIS — G4700 Insomnia, unspecified: Secondary | ICD-10-CM | POA: Diagnosis not present

## 2023-06-18 DIAGNOSIS — F1320 Sedative, hypnotic or anxiolytic dependence, uncomplicated: Secondary | ICD-10-CM | POA: Diagnosis not present

## 2023-06-18 DIAGNOSIS — B0821 Exanthema subitum [sixth disease] due to human herpesvirus 6: Secondary | ICD-10-CM | POA: Diagnosis not present

## 2023-06-18 DIAGNOSIS — F1520 Other stimulant dependence, uncomplicated: Secondary | ICD-10-CM | POA: Diagnosis not present

## 2023-06-18 DIAGNOSIS — F4310 Post-traumatic stress disorder, unspecified: Secondary | ICD-10-CM | POA: Diagnosis not present

## 2023-06-18 DIAGNOSIS — F1020 Alcohol dependence, uncomplicated: Secondary | ICD-10-CM | POA: Diagnosis not present

## 2023-06-19 DIAGNOSIS — F4310 Post-traumatic stress disorder, unspecified: Secondary | ICD-10-CM | POA: Diagnosis not present

## 2023-06-19 DIAGNOSIS — G4700 Insomnia, unspecified: Secondary | ICD-10-CM | POA: Diagnosis not present

## 2023-06-19 DIAGNOSIS — B0821 Exanthema subitum [sixth disease] due to human herpesvirus 6: Secondary | ICD-10-CM | POA: Diagnosis not present

## 2023-06-19 DIAGNOSIS — F1520 Other stimulant dependence, uncomplicated: Secondary | ICD-10-CM | POA: Diagnosis not present

## 2023-06-19 DIAGNOSIS — F1020 Alcohol dependence, uncomplicated: Secondary | ICD-10-CM | POA: Diagnosis not present

## 2023-06-19 DIAGNOSIS — F1420 Cocaine dependence, uncomplicated: Secondary | ICD-10-CM | POA: Diagnosis not present

## 2023-06-19 DIAGNOSIS — F329 Major depressive disorder, single episode, unspecified: Secondary | ICD-10-CM | POA: Diagnosis not present

## 2023-06-19 DIAGNOSIS — F1220 Cannabis dependence, uncomplicated: Secondary | ICD-10-CM | POA: Diagnosis not present

## 2023-06-19 DIAGNOSIS — F1320 Sedative, hypnotic or anxiolytic dependence, uncomplicated: Secondary | ICD-10-CM | POA: Diagnosis not present

## 2023-06-19 DIAGNOSIS — F1120 Opioid dependence, uncomplicated: Secondary | ICD-10-CM | POA: Diagnosis not present

## 2023-06-19 DIAGNOSIS — F17200 Nicotine dependence, unspecified, uncomplicated: Secondary | ICD-10-CM | POA: Diagnosis not present

## 2023-06-19 DIAGNOSIS — F419 Anxiety disorder, unspecified: Secondary | ICD-10-CM | POA: Diagnosis not present

## 2023-06-21 DIAGNOSIS — F1020 Alcohol dependence, uncomplicated: Secondary | ICD-10-CM | POA: Diagnosis not present

## 2023-06-21 DIAGNOSIS — G4700 Insomnia, unspecified: Secondary | ICD-10-CM | POA: Diagnosis not present

## 2023-06-21 DIAGNOSIS — F17200 Nicotine dependence, unspecified, uncomplicated: Secondary | ICD-10-CM | POA: Diagnosis not present

## 2023-06-21 DIAGNOSIS — F1220 Cannabis dependence, uncomplicated: Secondary | ICD-10-CM | POA: Diagnosis not present

## 2023-06-21 DIAGNOSIS — F419 Anxiety disorder, unspecified: Secondary | ICD-10-CM | POA: Diagnosis not present

## 2023-06-21 DIAGNOSIS — F1520 Other stimulant dependence, uncomplicated: Secondary | ICD-10-CM | POA: Diagnosis not present

## 2023-06-21 DIAGNOSIS — F1320 Sedative, hypnotic or anxiolytic dependence, uncomplicated: Secondary | ICD-10-CM | POA: Diagnosis not present

## 2023-06-21 DIAGNOSIS — B0821 Exanthema subitum [sixth disease] due to human herpesvirus 6: Secondary | ICD-10-CM | POA: Diagnosis not present

## 2023-06-21 DIAGNOSIS — F329 Major depressive disorder, single episode, unspecified: Secondary | ICD-10-CM | POA: Diagnosis not present

## 2023-06-21 DIAGNOSIS — F1120 Opioid dependence, uncomplicated: Secondary | ICD-10-CM | POA: Diagnosis not present

## 2023-06-21 DIAGNOSIS — F4310 Post-traumatic stress disorder, unspecified: Secondary | ICD-10-CM | POA: Diagnosis not present

## 2023-06-21 DIAGNOSIS — F1420 Cocaine dependence, uncomplicated: Secondary | ICD-10-CM | POA: Diagnosis not present

## 2023-06-25 DIAGNOSIS — G4700 Insomnia, unspecified: Secondary | ICD-10-CM | POA: Diagnosis not present

## 2023-06-25 DIAGNOSIS — F329 Major depressive disorder, single episode, unspecified: Secondary | ICD-10-CM | POA: Diagnosis not present

## 2023-06-25 DIAGNOSIS — F1120 Opioid dependence, uncomplicated: Secondary | ICD-10-CM | POA: Diagnosis not present

## 2023-06-25 DIAGNOSIS — F1020 Alcohol dependence, uncomplicated: Secondary | ICD-10-CM | POA: Diagnosis not present

## 2023-06-25 DIAGNOSIS — F17200 Nicotine dependence, unspecified, uncomplicated: Secondary | ICD-10-CM | POA: Diagnosis not present

## 2023-06-25 DIAGNOSIS — B0821 Exanthema subitum [sixth disease] due to human herpesvirus 6: Secondary | ICD-10-CM | POA: Diagnosis not present

## 2023-06-25 DIAGNOSIS — F1420 Cocaine dependence, uncomplicated: Secondary | ICD-10-CM | POA: Diagnosis not present

## 2023-06-25 DIAGNOSIS — F1520 Other stimulant dependence, uncomplicated: Secondary | ICD-10-CM | POA: Diagnosis not present

## 2023-06-25 DIAGNOSIS — F1220 Cannabis dependence, uncomplicated: Secondary | ICD-10-CM | POA: Diagnosis not present

## 2023-06-25 DIAGNOSIS — F1320 Sedative, hypnotic or anxiolytic dependence, uncomplicated: Secondary | ICD-10-CM | POA: Diagnosis not present

## 2023-06-25 DIAGNOSIS — F419 Anxiety disorder, unspecified: Secondary | ICD-10-CM | POA: Diagnosis not present

## 2023-06-25 DIAGNOSIS — F4310 Post-traumatic stress disorder, unspecified: Secondary | ICD-10-CM | POA: Diagnosis not present

## 2023-06-26 DIAGNOSIS — F1420 Cocaine dependence, uncomplicated: Secondary | ICD-10-CM | POA: Diagnosis not present

## 2023-06-26 DIAGNOSIS — F4310 Post-traumatic stress disorder, unspecified: Secondary | ICD-10-CM | POA: Diagnosis not present

## 2023-06-26 DIAGNOSIS — F17200 Nicotine dependence, unspecified, uncomplicated: Secondary | ICD-10-CM | POA: Diagnosis not present

## 2023-06-26 DIAGNOSIS — F1020 Alcohol dependence, uncomplicated: Secondary | ICD-10-CM | POA: Diagnosis not present

## 2023-06-26 DIAGNOSIS — F1220 Cannabis dependence, uncomplicated: Secondary | ICD-10-CM | POA: Diagnosis not present

## 2023-06-26 DIAGNOSIS — F329 Major depressive disorder, single episode, unspecified: Secondary | ICD-10-CM | POA: Diagnosis not present

## 2023-06-26 DIAGNOSIS — F1320 Sedative, hypnotic or anxiolytic dependence, uncomplicated: Secondary | ICD-10-CM | POA: Diagnosis not present

## 2023-06-26 DIAGNOSIS — F1520 Other stimulant dependence, uncomplicated: Secondary | ICD-10-CM | POA: Diagnosis not present

## 2023-06-26 DIAGNOSIS — F419 Anxiety disorder, unspecified: Secondary | ICD-10-CM | POA: Diagnosis not present

## 2023-06-26 DIAGNOSIS — F1120 Opioid dependence, uncomplicated: Secondary | ICD-10-CM | POA: Diagnosis not present

## 2023-06-26 DIAGNOSIS — B0821 Exanthema subitum [sixth disease] due to human herpesvirus 6: Secondary | ICD-10-CM | POA: Diagnosis not present

## 2023-06-26 DIAGNOSIS — G4700 Insomnia, unspecified: Secondary | ICD-10-CM | POA: Diagnosis not present

## 2023-06-28 DIAGNOSIS — F1120 Opioid dependence, uncomplicated: Secondary | ICD-10-CM | POA: Diagnosis not present

## 2023-06-28 DIAGNOSIS — F329 Major depressive disorder, single episode, unspecified: Secondary | ICD-10-CM | POA: Diagnosis not present

## 2023-06-28 DIAGNOSIS — F1520 Other stimulant dependence, uncomplicated: Secondary | ICD-10-CM | POA: Diagnosis not present

## 2023-06-28 DIAGNOSIS — F1420 Cocaine dependence, uncomplicated: Secondary | ICD-10-CM | POA: Diagnosis not present

## 2023-06-28 DIAGNOSIS — F1220 Cannabis dependence, uncomplicated: Secondary | ICD-10-CM | POA: Diagnosis not present

## 2023-06-28 DIAGNOSIS — F419 Anxiety disorder, unspecified: Secondary | ICD-10-CM | POA: Diagnosis not present

## 2023-06-28 DIAGNOSIS — M62830 Muscle spasm of back: Secondary | ICD-10-CM | POA: Diagnosis not present

## 2023-06-28 DIAGNOSIS — F4310 Post-traumatic stress disorder, unspecified: Secondary | ICD-10-CM | POA: Diagnosis not present

## 2023-06-28 DIAGNOSIS — F17200 Nicotine dependence, unspecified, uncomplicated: Secondary | ICD-10-CM | POA: Diagnosis not present

## 2023-06-28 DIAGNOSIS — B0821 Exanthema subitum [sixth disease] due to human herpesvirus 6: Secondary | ICD-10-CM | POA: Diagnosis not present

## 2023-06-28 DIAGNOSIS — G4700 Insomnia, unspecified: Secondary | ICD-10-CM | POA: Diagnosis not present

## 2023-06-28 DIAGNOSIS — F1320 Sedative, hypnotic or anxiolytic dependence, uncomplicated: Secondary | ICD-10-CM | POA: Diagnosis not present

## 2023-06-28 DIAGNOSIS — F1020 Alcohol dependence, uncomplicated: Secondary | ICD-10-CM | POA: Diagnosis not present

## 2023-08-28 ENCOUNTER — Ambulatory Visit (HOSPITAL_COMMUNITY)
Admission: EM | Admit: 2023-08-28 | Discharge: 2023-08-28 | Disposition: A | Discharge status: Home / Self Care | Payer: 59 | Attending: Psychiatry | Admitting: Psychiatry

## 2023-08-28 ENCOUNTER — Ambulatory Visit (HOSPITAL_COMMUNITY)
Admission: EM | Admit: 2023-08-28 | Discharge: 2023-08-28 | Disposition: A | Discharge status: Home / Self Care | Payer: 59

## 2023-08-28 DIAGNOSIS — Z765 Malingerer [conscious simulation]: Secondary | ICD-10-CM | POA: Diagnosis not present

## 2023-08-28 DIAGNOSIS — F411 Generalized anxiety disorder: Secondary | ICD-10-CM | POA: Diagnosis not present

## 2023-08-28 DIAGNOSIS — F332 Major depressive disorder, recurrent severe without psychotic features: Secondary | ICD-10-CM

## 2023-08-28 DIAGNOSIS — Z5900 Homelessness unspecified: Secondary | ICD-10-CM

## 2023-08-28 DIAGNOSIS — R4589 Other symptoms and signs involving emotional state: Secondary | ICD-10-CM

## 2023-08-28 NOTE — ED Provider Notes (Signed)
Behavioral Health Urgent Care Medical Screening Exam  Patient Name: Justin May MRN: [id number] Date of Evaluation: 08/29/23 Chief Complaint:  homelessness work but Engineer, mining make enough money Diagnosis:  Final diagnoses:  Malingering  Homelessness unspecified  Ineffective coping    History of Present illness: Justin May is a 27 y.o. male. With a history of MDD, GAD, alcohol abuse disorder, homelessness.  Presented to [facility] voluntarily.  Patient was just discharged after being seen around 5 PM this afternoon however patient is back.  Prior to coming back patient was asking questions what do he need to say so he can get admitted.     See prior triage notes: Pt presents to [facility] as a voluntary walk-in, with complaint of SI, with a plan to walk in front of a car. Pt was seen at [facility] and discharged a few hours ago due to seeking alcohol detox and alcohol use treatment. Pt reports abusing Gabapentin (13-14 pills around 10am). Pt vitals are within normal limits, no signs of drowsiness, confusion or impaired coordination. Pt reports past suicide attempt about 3 years ago, by cutting himself but states he was able to stop himself. He states he was not hospitalized after that incident. Pt reports past psychiatric hospitalization about 3 months ago, for about 7 days. Pt states he left this facility and went to [facility] for 1 month, then did 1 month at [facility] where he was kicked out for using substances. He states he then went to a [facility] and was kicked out for using substances. Pt currently denies HI,AVH.     Face-to-face observation of patient, patient is alert and oriented x 4, speech is clear maintain eye contact.  Patient appearance is well groomed, showing no signs of distress.  When writer entered the room to ask patient question patient did not want to answer the questions when asked if he is really suicidal patient refused to answer then he stated I am ready to go.  When asked  if he is homeless, patient stated yes he got kicked out a couple days ago.  When asked if he works patient stated yes but he does not make enough money.  Writer discussed with patient that he needs to reach out to the men shelter for assistance.  Patient does appear to be seeking secondary gain and in so doing patient is fabricating stories and asking question as to what he need to say so he can stay here. After talking with patient about reaching out to the men shelter,  pt stated he was ready to go and he dont want to answer any questions.   Recommend discharge for patient to f/u with men shelter         Flowsheet Row ED from 08/28/2023 in [facility] Most recent reading at 08/28/2023  7:47 PM ED from 08/28/2023 in [facility] Most recent reading at 08/28/2023  4:22 PM ED from 04/24/2023 in [facility] Most recent reading at 04/24/2023  3:48 PM  C-SSRS RISK CATEGORY Moderate Risk No Risk No Risk       Psychiatric Specialty Exam  Presentation  General Appearance:Casual  Eye Contact:Good  Speech:Clear and Coherent  Speech Volume:Normal  Handedness:Right   Mood and Affect  Mood: Anxious  Affect: Appropriate   Thought Process  Thought Processes: Coherent  Descriptions of Associations:Intact  Orientation:Full (Time, Place and Person)  Thought Content:Logical  Diagnosis of Schizophrenia or Schizoaffective disorder in past: No  Hallucinations:None  Ideas of Reference:None  Suicidal Thoughts:Yes, Passive Without Plan; Without Intent  Homicidal Thoughts:No   Sensorium  Memory: Immediate Fair  Judgment: Fair  Insight: Fair   Art therapist  Concentration: Fair  Attention Span: Good  Recall: Good  Fund of Knowledge: Good  Language: Good   Psychomotor Activity  Psychomotor Activity: Normal   Assets  Assets: Desire for Improvement;  Housing   Sleep  Sleep: Fair  Number of hours:  8   Physical Exam: Physical Exam HENT:     Head: Normocephalic.     Nose: Nose normal.  Eyes:     Pupils: Pupils are equal, round, and reactive to light.  Cardiovascular:     Rate and Rhythm: Normal rate.  Pulmonary:     Effort: Pulmonary effort is normal.  Musculoskeletal:        General: Normal range of motion.     Cervical back: Normal range of motion.  Skin:    General: Skin is warm.  Neurological:     General: No focal deficit present.     Mental Status: He is alert.  Psychiatric:        Mood and Affect: Mood normal.        Behavior: Behavior normal.        Thought Content: Thought content normal.    Review of Systems  Constitutional: Negative.   HENT: Negative.    Eyes: Negative.   Respiratory: Negative.    Cardiovascular: Negative.   Gastrointestinal: Negative.   Genitourinary: Negative.   Musculoskeletal: Negative.   Skin: Negative.   Neurological: Negative.   Psychiatric/Behavioral:  Positive for substance abuse. The patient is nervous/anxious.    Blood pressure 112/77, pulse 72, temperature 98.1 F (36.7 C), temperature source Oral, resp. rate 18, SpO2 99%. There is no height or weight on file to calculate BMI.  Musculoskeletal: Strength & Muscle Tone: within normal limits Gait & Station: normal Patient leans: N/A   [facility] MSE Discharge Disposition for Follow up and Recommendations: Based on my evaluation the patient does not appear to have an emergency medical condition and can be discharged with resources and follow up care in outpatient services for Individual Therapy   Sindy Guadeloupe, NP 08/29/2023, 4:39 AM

## 2023-08-28 NOTE — Discharge Instructions (Signed)

## 2023-08-28 NOTE — ED Notes (Addendum)
Provider notified of pt having SI.
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
## 2023-08-28 NOTE — ED Notes (Signed)
Pt discharged with  AVS.  AVS reviewed prior to discharge.  Pt alert, oriented, and ambulatory.  Safety maintained.  °

## 2023-08-28 NOTE — ED Notes (Addendum)
Pt was given resources for homeless shelter D/T being kicked out of moms house. Pt seen today for alcohol/substance abuse, MDD, & GAD. Pt  denied SI, HI, & AVH while with provider. After being d/c pt asked this nurse what was the resources for, because he didn't need them he's suicidal. This nurse educated pt on resources/shelter handout that was given.
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
## 2023-08-28 NOTE — Discharge Instructions (Signed)
F/u with men shelter 

## 2023-08-28 NOTE — ED Notes (Signed)
Patient has been given his AVS, and received his belongings and has been walked to the waiting room.
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
## 2023-08-30 DIAGNOSIS — F1020 Alcohol dependence, uncomplicated: Secondary | ICD-10-CM | POA: Diagnosis not present

## 2023-08-31 DIAGNOSIS — F1020 Alcohol dependence, uncomplicated: Secondary | ICD-10-CM | POA: Diagnosis not present

## 2023-09-01 DIAGNOSIS — F1020 Alcohol dependence, uncomplicated: Secondary | ICD-10-CM | POA: Diagnosis not present

## 2023-09-02 DIAGNOSIS — F1020 Alcohol dependence, uncomplicated: Secondary | ICD-10-CM | POA: Diagnosis not present

## 2023-09-03 DIAGNOSIS — F1020 Alcohol dependence, uncomplicated: Secondary | ICD-10-CM | POA: Diagnosis not present

## 2023-09-04 DIAGNOSIS — F1020 Alcohol dependence, uncomplicated: Secondary | ICD-10-CM | POA: Diagnosis not present

## 2023-09-19 DIAGNOSIS — F1020 Alcohol dependence, uncomplicated: Secondary | ICD-10-CM | POA: Diagnosis not present

## 2023-09-20 DIAGNOSIS — F1020 Alcohol dependence, uncomplicated: Secondary | ICD-10-CM | POA: Diagnosis not present

## 2023-09-24 DIAGNOSIS — F1020 Alcohol dependence, uncomplicated: Secondary | ICD-10-CM | POA: Diagnosis not present

## 2023-10-11 DIAGNOSIS — F411 Generalized anxiety disorder: Secondary | ICD-10-CM | POA: Diagnosis not present

## 2023-10-23 DIAGNOSIS — F419 Anxiety disorder, unspecified: Secondary | ICD-10-CM | POA: Diagnosis not present

## 2023-12-04 ENCOUNTER — Ambulatory Visit: Payer: Self-pay

## 2023-12-04 NOTE — Telephone Encounter (Signed)
  Chief Complaint: right neck and shoulder pain, headache Symptoms: right neck swelling , radiates to right shoulder. Headaches pain level 7/10. Tense feeling upper neck.  Frequency: 7 months but worsening pain last week Pertinent Negatives: Patient denies chest pain no difficulty breathing no fever no weakness right arm Disposition: [] ED /[] Urgent Care (no appt availability in office) / [] Appointment(In office/virtual)/ []  [facility] [facility]/ [] Home Care/ [] Refused Recommended Disposition /[x] [facility]/ []  Follow-up with PCP Additional Notes:   No PCP. Recommended mobile bus today or ortho care. Patient requesting new patient appt. None available until May for [facility].        Copied from CRM [id number]. Topic: Clinical - Red Word Triage >> Dec 04, 2023  2:52 PM [place] A wrote: [facility] that prompted transfer to Nurse Triage: Pain in right neck and shoulder; pain 1-10; 8 pain has been going on for seven months but has become worst Reason for Disposition  [1] SEVERE neck pain (e.g., excruciating, unable to do any normal activities) AND [2] not improved after 2 hours of pain medicine  Answer Assessment - Initial Assessment Questions 1. ONSET: "When did the pain begin?"      7 months ago recently 1 week ago 2. LOCATION: "Where does it hurt?"      Upper neck and shoulder 3. PATTERN "Does the pain come and go, or has it been constant since it started?"      Constant now  4. SEVERITY: "How bad is the pain?"  (Scale 1-10; or mild, moderate, severe)   - NO PAIN (0): no pain or only slight stiffness    - MILD (1-3): doesn't interfere with normal activities    - MODERATE (4-7): interferes with normal activities or awakens from sleep    - SEVERE (8-10):  excruciating pain, unable to do any normal activities      Pain 7/10  5. RADIATION: "Does the pain go anywhere else, shoot into your arms?"     Right neck right shoulder 6. CORD SYMPTOMS: "Any weakness or numbness of  the arms or legs?"     na 7. CAUSE: "What do you think is causing the neck pain?"     Not sure  8. NECK OVERUSE: "Any recent activities that involved turning or twisting the neck?"     Na  9. OTHER SYMPTOMS: "Do you have any other symptoms?" (e.g., headache, fever, chest pain, difficulty breathing, neck swelling)     Right side neck swelling , headache. 10. PREGNANCY: "Is there any chance you are pregnant?" "When was your last menstrual period?"       na  Protocols used: Neck Pain or Stiffness-A-AH

## 2023-12-04 NOTE — Telephone Encounter (Signed)
 Patient called back and offered NP appointment tomorrow, he agreed, scheduled at [facility]. He says he is not at the mobile bus.

## 2023-12-05 ENCOUNTER — Ambulatory Visit (INDEPENDENT_AMBULATORY_CARE_PROVIDER_SITE_OTHER): Payer: Self-pay | Admitting: Family Medicine

## 2023-12-05 ENCOUNTER — Encounter: Payer: Self-pay | Admitting: Family Medicine

## 2023-12-05 VITALS — BP 124/58 | HR 79 | Ht 71.0 in | Wt 166.0 lb

## 2023-12-05 DIAGNOSIS — F1911 Other psychoactive substance abuse, in remission: Secondary | ICD-10-CM

## 2023-12-05 DIAGNOSIS — F419 Anxiety disorder, unspecified: Secondary | ICD-10-CM

## 2023-12-05 DIAGNOSIS — F32A Depression, unspecified: Secondary | ICD-10-CM | POA: Diagnosis not present

## 2023-12-05 DIAGNOSIS — M542 Cervicalgia: Secondary | ICD-10-CM

## 2023-12-05 DIAGNOSIS — F17200 Nicotine dependence, unspecified, uncomplicated: Secondary | ICD-10-CM

## 2023-12-05 MED ORDER — MELOXICAM 7.5 MG PO TABS
7.5000 mg | ORAL_TABLET | Freq: Every day | ORAL | 0 refills | Status: DC
Start: 2023-12-05 — End: 2023-12-07

## 2023-12-05 NOTE — Patient Instructions (Signed)

## 2023-12-05 NOTE — Assessment & Plan Note (Signed)
 Drug addiction in remission, residing in [facility], attending [employer], and has medication management provider. - Continue participation in NA meetings. - Maintain current living situation in [place] [facility].
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
## 2023-12-05 NOTE — Assessment & Plan Note (Signed)
 Ongoing anxiety and depression with substance use disorder. Lexapro 20 mg daily for 3-4 months, initial erectile dysfunction resolved. Uncertain about effectiveness, no self-harm thoughts. - Continue Lexapro 20 mg daily. - Encourage ongoing participation in [employer].

## 2023-12-05 NOTE — Assessment & Plan Note (Signed)
 Cessation encouraged

## 2023-12-07 ENCOUNTER — Telehealth: Payer: Self-pay | Admitting: Neurology

## 2023-12-07 MED ORDER — MELOXICAM 15 MG PO TABS: 15.0000 mg | ORAL_TABLET | Freq: Every day | ORAL | 2 refills | Status: AC

## 2023-12-07 NOTE — Telephone Encounter (Signed)
 Please advise.   Copied from CRM [id number]. Topic: Clinical - Medication Question >> Dec 07, 2023 11:25 AM Adaysia C wrote: Reason for CRM: Patient called to ask the provider if she can increase the dose to 15MG  for the [id number] (MOBIC) 7.5 MG tablet because he has been taking the 7.5MG  tablets and they are not working because he is still in a lot of back pain; Patient asked if the provider increases the dosage can he start taking two 7.5MG  tablets a day instead of one; Please follow up with patient to provide an answer [phone]

## 2023-12-07 NOTE — Telephone Encounter (Signed)
 Yes, okay to go up to Meloxicam 15 mg daily. Just monitor for any signs of GI upset/bleeding.

## 2023-12-07 NOTE — Telephone Encounter (Signed)
 Patient made aware and new dosage sent to pharmacy.

## 2023-12-12 ENCOUNTER — Ambulatory Visit: Admitting: Physical Therapy

## 2024-01-01 ENCOUNTER — Other Ambulatory Visit: Payer: Self-pay | Admitting: Family Medicine

## 2024-01-01 DIAGNOSIS — M542 Cervicalgia: Secondary | ICD-10-CM

## 2024-03-06 ENCOUNTER — Ambulatory Visit: Payer: Self-pay | Admitting: Family Medicine
